# Patient Record
Sex: Female | Born: 1960 | Race: Black or African American | Hispanic: No | Marital: Married | Smoking: Never smoker
Health system: Southern US, Community
[De-identification: ages and names within clinical notes are randomized; demographics above are authoritative.]

## PROBLEM LIST (undated history)

## (undated) DIAGNOSIS — K219 Gastro-esophageal reflux disease without esophagitis: Secondary | ICD-10-CM

## (undated) DIAGNOSIS — M1990 Unspecified osteoarthritis, unspecified site: Secondary | ICD-10-CM

## (undated) DIAGNOSIS — D649 Anemia, unspecified: Secondary | ICD-10-CM

## (undated) DIAGNOSIS — E079 Disorder of thyroid, unspecified: Secondary | ICD-10-CM

## (undated) DIAGNOSIS — K760 Fatty (change of) liver, not elsewhere classified: Secondary | ICD-10-CM

## (undated) DIAGNOSIS — I10 Essential (primary) hypertension: Secondary | ICD-10-CM

## (undated) HISTORY — DX: Essential (primary) hypertension: I10

## (undated) HISTORY — DX: Disorder of thyroid, unspecified: E07.9

## (undated) HISTORY — PX: APPENDECTOMY: SHX54

## (undated) HISTORY — DX: Anemia, unspecified: D64.9

## (undated) HISTORY — PX: CARPAL TUNNEL RELEASE: SHX101

## (undated) HISTORY — DX: Unspecified osteoarthritis, unspecified site: M19.90

---

## 1999-09-04 ENCOUNTER — Other Ambulatory Visit: Admission: RE | Admit: 1999-09-04 | Discharge: 1999-09-04 | Payer: Self-pay | Admitting: Obstetrics and Gynecology

## 1999-12-02 ENCOUNTER — Encounter (INDEPENDENT_AMBULATORY_CARE_PROVIDER_SITE_OTHER): Payer: Self-pay

## 1999-12-02 ENCOUNTER — Inpatient Hospital Stay (HOSPITAL_COMMUNITY): Admission: AD | Admit: 1999-12-02 | Discharge: 1999-12-04 | Payer: Self-pay | Admitting: *Deleted

## 2001-06-22 ENCOUNTER — Other Ambulatory Visit: Admission: RE | Admit: 2001-06-22 | Discharge: 2001-06-22 | Payer: Self-pay | Admitting: General Surgery

## 2001-12-16 ENCOUNTER — Other Ambulatory Visit: Admission: RE | Admit: 2001-12-16 | Discharge: 2001-12-16 | Payer: Self-pay | Admitting: Obstetrics and Gynecology

## 2002-03-03 ENCOUNTER — Encounter: Payer: Self-pay | Admitting: Obstetrics and Gynecology

## 2002-03-03 ENCOUNTER — Ambulatory Visit (HOSPITAL_COMMUNITY): Admission: RE | Admit: 2002-03-03 | Discharge: 2002-03-03 | Payer: Self-pay | Admitting: Obstetrics and Gynecology

## 2002-04-29 ENCOUNTER — Encounter: Admission: RE | Admit: 2002-04-29 | Discharge: 2002-04-29 | Payer: Self-pay | Admitting: Obstetrics and Gynecology

## 2002-05-11 ENCOUNTER — Encounter: Payer: Self-pay | Admitting: Obstetrics and Gynecology

## 2002-05-11 ENCOUNTER — Ambulatory Visit (HOSPITAL_COMMUNITY): Admission: RE | Admit: 2002-05-11 | Discharge: 2002-05-11 | Payer: Self-pay | Admitting: Obstetrics and Gynecology

## 2002-06-10 ENCOUNTER — Encounter: Payer: Self-pay | Admitting: Obstetrics and Gynecology

## 2002-06-10 ENCOUNTER — Ambulatory Visit (HOSPITAL_COMMUNITY): Admission: RE | Admit: 2002-06-10 | Discharge: 2002-06-10 | Payer: Self-pay | Admitting: Obstetrics and Gynecology

## 2002-06-16 ENCOUNTER — Encounter: Payer: Self-pay | Admitting: Obstetrics and Gynecology

## 2002-06-16 ENCOUNTER — Ambulatory Visit (HOSPITAL_COMMUNITY): Admission: RE | Admit: 2002-06-16 | Discharge: 2002-06-16 | Payer: Self-pay | Admitting: Obstetrics and Gynecology

## 2002-07-07 ENCOUNTER — Ambulatory Visit (HOSPITAL_COMMUNITY): Admission: RE | Admit: 2002-07-07 | Discharge: 2002-07-07 | Payer: Self-pay | Admitting: Obstetrics and Gynecology

## 2002-07-07 ENCOUNTER — Encounter: Payer: Self-pay | Admitting: Obstetrics and Gynecology

## 2002-07-11 ENCOUNTER — Inpatient Hospital Stay (HOSPITAL_COMMUNITY): Admission: AD | Admit: 2002-07-11 | Discharge: 2002-07-13 | Payer: Self-pay | Admitting: Obstetrics and Gynecology

## 2002-09-29 ENCOUNTER — Ambulatory Visit (HOSPITAL_BASED_OUTPATIENT_CLINIC_OR_DEPARTMENT_OTHER): Admission: RE | Admit: 2002-09-29 | Discharge: 2002-09-29 | Payer: Self-pay | Admitting: Orthopedic Surgery

## 2003-03-01 ENCOUNTER — Other Ambulatory Visit: Admission: RE | Admit: 2003-03-01 | Discharge: 2003-03-01 | Payer: Self-pay | Admitting: Obstetrics and Gynecology

## 2003-07-22 ENCOUNTER — Emergency Department (HOSPITAL_COMMUNITY): Admission: EM | Admit: 2003-07-22 | Discharge: 2003-07-22 | Payer: Self-pay | Admitting: Emergency Medicine

## 2003-07-22 ENCOUNTER — Encounter: Payer: Self-pay | Admitting: Emergency Medicine

## 2003-07-23 ENCOUNTER — Emergency Department (HOSPITAL_COMMUNITY): Admission: EM | Admit: 2003-07-23 | Discharge: 2003-07-23 | Payer: Self-pay | Admitting: Emergency Medicine

## 2004-11-15 ENCOUNTER — Ambulatory Visit: Payer: Self-pay | Admitting: Family Medicine

## 2005-02-14 ENCOUNTER — Ambulatory Visit: Payer: Self-pay | Admitting: Family Medicine

## 2006-04-24 ENCOUNTER — Ambulatory Visit (HOSPITAL_COMMUNITY): Admission: RE | Admit: 2006-04-24 | Discharge: 2006-04-24 | Payer: Self-pay | Admitting: Obstetrics and Gynecology

## 2006-05-28 ENCOUNTER — Other Ambulatory Visit: Admission: RE | Admit: 2006-05-28 | Discharge: 2006-05-28 | Payer: Self-pay | Admitting: Obstetrics and Gynecology

## 2006-08-28 ENCOUNTER — Emergency Department (HOSPITAL_COMMUNITY): Admission: EM | Admit: 2006-08-28 | Discharge: 2006-08-28 | Payer: Self-pay | Admitting: Emergency Medicine

## 2006-12-28 ENCOUNTER — Ambulatory Visit: Payer: Self-pay | Admitting: Family Medicine

## 2007-01-19 ENCOUNTER — Ambulatory Visit: Payer: Self-pay | Admitting: Family Medicine

## 2007-06-16 ENCOUNTER — Ambulatory Visit: Payer: Self-pay | Admitting: Family Medicine

## 2007-06-16 LAB — CONVERTED CEMR LAB
ALT: 9 units/L (ref 0–35)
Alkaline Phosphatase: 71 units/L (ref 39–117)
CO2: 24 meq/L (ref 19–32)
LDL Cholesterol: 117 mg/dL — ABNORMAL HIGH (ref 0–99)
Sodium: 142 meq/L (ref 135–145)
Total Bilirubin: 0.4 mg/dL (ref 0.3–1.2)
Total Protein: 7.2 g/dL (ref 6.0–8.3)
VLDL: 17 mg/dL (ref 0–40)

## 2007-08-04 ENCOUNTER — Telehealth (INDEPENDENT_AMBULATORY_CARE_PROVIDER_SITE_OTHER): Payer: Self-pay | Admitting: *Deleted

## 2007-08-05 ENCOUNTER — Ambulatory Visit: Payer: Self-pay | Admitting: Nurse Practitioner

## 2007-08-05 DIAGNOSIS — R5383 Other fatigue: Secondary | ICD-10-CM

## 2007-08-05 DIAGNOSIS — K219 Gastro-esophageal reflux disease without esophagitis: Secondary | ICD-10-CM

## 2007-08-05 DIAGNOSIS — R5381 Other malaise: Secondary | ICD-10-CM

## 2007-08-05 DIAGNOSIS — Z9089 Acquired absence of other organs: Secondary | ICD-10-CM | POA: Insufficient documentation

## 2007-08-05 DIAGNOSIS — I10 Essential (primary) hypertension: Secondary | ICD-10-CM | POA: Insufficient documentation

## 2007-08-05 DIAGNOSIS — Z8669 Personal history of other diseases of the nervous system and sense organs: Secondary | ICD-10-CM | POA: Insufficient documentation

## 2007-08-05 LAB — CONVERTED CEMR LAB
Hemoglobin: 13.8 g/dL (ref 12.0–15.0)
Lymphocytes Relative: 39 % (ref 12–46)
Monocytes Absolute: 0.3 10*3/uL (ref 0.2–0.7)
Monocytes Relative: 5 % (ref 3–11)
Neutro Abs: 3.2 10*3/uL (ref 1.7–7.7)
RBC: 5.12 M/uL — ABNORMAL HIGH (ref 3.87–5.11)

## 2007-08-09 DIAGNOSIS — M79609 Pain in unspecified limb: Secondary | ICD-10-CM

## 2007-08-10 ENCOUNTER — Ambulatory Visit: Payer: Self-pay | Admitting: Family Medicine

## 2007-09-06 ENCOUNTER — Telehealth (INDEPENDENT_AMBULATORY_CARE_PROVIDER_SITE_OTHER): Payer: Self-pay | Admitting: Internal Medicine

## 2007-09-08 ENCOUNTER — Ambulatory Visit: Payer: Self-pay | Admitting: Family Medicine

## 2007-09-09 ENCOUNTER — Telehealth (INDEPENDENT_AMBULATORY_CARE_PROVIDER_SITE_OTHER): Payer: Self-pay | Admitting: *Deleted

## 2007-09-09 ENCOUNTER — Ambulatory Visit (HOSPITAL_COMMUNITY): Admission: RE | Admit: 2007-09-09 | Discharge: 2007-09-09 | Payer: Self-pay | Admitting: Family Medicine

## 2007-09-14 ENCOUNTER — Ambulatory Visit: Payer: Self-pay | Admitting: Family Medicine

## 2007-09-20 ENCOUNTER — Encounter (INDEPENDENT_AMBULATORY_CARE_PROVIDER_SITE_OTHER): Payer: Self-pay | Admitting: Family Medicine

## 2007-09-21 ENCOUNTER — Encounter (INDEPENDENT_AMBULATORY_CARE_PROVIDER_SITE_OTHER): Payer: Self-pay | Admitting: Family Medicine

## 2007-09-22 ENCOUNTER — Encounter (INDEPENDENT_AMBULATORY_CARE_PROVIDER_SITE_OTHER): Payer: Self-pay | Admitting: Family Medicine

## 2008-06-29 ENCOUNTER — Ambulatory Visit: Payer: Self-pay | Admitting: Nurse Practitioner

## 2008-06-29 DIAGNOSIS — E049 Nontoxic goiter, unspecified: Secondary | ICD-10-CM | POA: Insufficient documentation

## 2008-06-29 DIAGNOSIS — B370 Candidal stomatitis: Secondary | ICD-10-CM | POA: Insufficient documentation

## 2008-06-30 LAB — CONVERTED CEMR LAB
ALT: 11 units/L (ref 0–35)
BUN: 13 mg/dL (ref 6–23)
Basophils Absolute: 0 10*3/uL (ref 0.0–0.1)
Basophils Relative: 0 % (ref 0–1)
CO2: 30 meq/L (ref 19–32)
Creatinine, Ser: 0.7 mg/dL (ref 0.40–1.20)
Eosinophils Relative: 6 % — ABNORMAL HIGH (ref 0–5)
HCT: 40.7 % (ref 36.0–46.0)
Hemoglobin: 13.4 g/dL (ref 12.0–15.0)
Lymphocytes Relative: 41 % (ref 12–46)
MCHC: 32.9 g/dL (ref 30.0–36.0)
Monocytes Absolute: 0.5 10*3/uL (ref 0.1–1.0)
RDW: 13.5 % (ref 11.5–15.5)
TSH: 1.544 microintl units/mL (ref 0.350–4.50)
Total Bilirubin: 0.3 mg/dL (ref 0.3–1.2)

## 2008-07-03 ENCOUNTER — Ambulatory Visit: Payer: Self-pay | Admitting: *Deleted

## 2008-07-05 ENCOUNTER — Ambulatory Visit (HOSPITAL_COMMUNITY): Admission: RE | Admit: 2008-07-05 | Discharge: 2008-07-05 | Payer: Self-pay | Admitting: Family Medicine

## 2008-07-06 ENCOUNTER — Telehealth (INDEPENDENT_AMBULATORY_CARE_PROVIDER_SITE_OTHER): Payer: Self-pay | Admitting: Family Medicine

## 2008-07-31 ENCOUNTER — Telehealth (INDEPENDENT_AMBULATORY_CARE_PROVIDER_SITE_OTHER): Payer: Self-pay | Admitting: Family Medicine

## 2008-08-15 ENCOUNTER — Encounter (INDEPENDENT_AMBULATORY_CARE_PROVIDER_SITE_OTHER): Payer: Self-pay | Admitting: *Deleted

## 2008-12-03 ENCOUNTER — Emergency Department (HOSPITAL_COMMUNITY): Admission: EM | Admit: 2008-12-03 | Discharge: 2008-12-03 | Payer: Self-pay | Admitting: Family Medicine

## 2008-12-04 ENCOUNTER — Telehealth (INDEPENDENT_AMBULATORY_CARE_PROVIDER_SITE_OTHER): Payer: Self-pay | Admitting: *Deleted

## 2009-01-01 ENCOUNTER — Ambulatory Visit: Payer: Self-pay | Admitting: Family Medicine

## 2009-01-01 DIAGNOSIS — Z8639 Personal history of other endocrine, nutritional and metabolic disease: Secondary | ICD-10-CM

## 2009-01-01 DIAGNOSIS — Z862 Personal history of diseases of the blood and blood-forming organs and certain disorders involving the immune mechanism: Secondary | ICD-10-CM

## 2009-01-01 DIAGNOSIS — Z87448 Personal history of other diseases of urinary system: Secondary | ICD-10-CM | POA: Insufficient documentation

## 2009-01-01 LAB — CONVERTED CEMR LAB
Bilirubin Urine: NEGATIVE
Blood in Urine, dipstick: NEGATIVE
CO2: 24 meq/L (ref 19–32)
Chloride: 102 meq/L (ref 96–112)
Glucose, Bld: 76 mg/dL (ref 70–99)
Ketones, urine, test strip: NEGATIVE
Nitrite: NEGATIVE
Potassium: 3.8 meq/L (ref 3.5–5.3)
Sodium: 144 meq/L (ref 135–145)

## 2009-01-02 ENCOUNTER — Encounter (INDEPENDENT_AMBULATORY_CARE_PROVIDER_SITE_OTHER): Payer: Self-pay | Admitting: Family Medicine

## 2009-10-19 ENCOUNTER — Telehealth: Payer: Self-pay | Admitting: Physician Assistant

## 2009-10-31 ENCOUNTER — Ambulatory Visit: Payer: Self-pay | Admitting: Physician Assistant

## 2009-10-31 DIAGNOSIS — R079 Chest pain, unspecified: Secondary | ICD-10-CM

## 2009-11-01 ENCOUNTER — Encounter: Payer: Self-pay | Admitting: Physician Assistant

## 2009-11-17 HISTORY — PX: EYE SURGERY: SHX253

## 2010-06-20 ENCOUNTER — Telehealth: Payer: Self-pay | Admitting: Physician Assistant

## 2010-08-30 ENCOUNTER — Ambulatory Visit: Payer: Self-pay | Admitting: Physician Assistant

## 2010-08-30 DIAGNOSIS — M542 Cervicalgia: Secondary | ICD-10-CM

## 2010-09-02 ENCOUNTER — Ambulatory Visit (HOSPITAL_COMMUNITY): Admission: RE | Admit: 2010-09-02 | Discharge: 2010-09-02 | Payer: Self-pay | Admitting: Internal Medicine

## 2010-09-02 LAB — CONVERTED CEMR LAB
BUN: 13 mg/dL (ref 6–23)
CRP: 0.6 mg/dL — ABNORMAL HIGH (ref <0.6)
Glucose, Bld: 87 mg/dL (ref 70–99)
Potassium: 3.7 meq/L (ref 3.5–5.3)

## 2010-09-03 ENCOUNTER — Encounter: Payer: Self-pay | Admitting: Physician Assistant

## 2010-09-03 ENCOUNTER — Encounter (INDEPENDENT_AMBULATORY_CARE_PROVIDER_SITE_OTHER): Payer: Self-pay | Admitting: *Deleted

## 2010-09-04 ENCOUNTER — Encounter (INDEPENDENT_AMBULATORY_CARE_PROVIDER_SITE_OTHER): Payer: Self-pay | Admitting: *Deleted

## 2010-09-16 ENCOUNTER — Ambulatory Visit: Payer: Self-pay | Admitting: Physician Assistant

## 2010-09-16 ENCOUNTER — Encounter (INDEPENDENT_AMBULATORY_CARE_PROVIDER_SITE_OTHER): Payer: Self-pay | Admitting: Nurse Practitioner

## 2010-09-16 LAB — CONVERTED CEMR LAB
BUN: 12 mg/dL (ref 6–23)
Calcium: 9.3 mg/dL (ref 8.4–10.5)
Creatinine, Ser: 0.64 mg/dL (ref 0.40–1.20)
Glucose, Bld: 80 mg/dL (ref 70–99)

## 2010-09-17 ENCOUNTER — Encounter (INDEPENDENT_AMBULATORY_CARE_PROVIDER_SITE_OTHER): Payer: Self-pay | Admitting: Nurse Practitioner

## 2010-10-30 ENCOUNTER — Ambulatory Visit: Payer: Self-pay | Admitting: Nurse Practitioner

## 2010-11-06 ENCOUNTER — Telehealth (INDEPENDENT_AMBULATORY_CARE_PROVIDER_SITE_OTHER): Payer: Self-pay | Admitting: *Deleted

## 2010-12-08 ENCOUNTER — Encounter: Payer: Self-pay | Admitting: Internal Medicine

## 2010-12-15 LAB — CONVERTED CEMR LAB
ALT: 17 units/L (ref 0–35)
AST: 14 units/L (ref 0–37)
Basophils Absolute: 0 10*3/uL (ref 0.0–0.1)
Basophils Relative: 0 % (ref 0–1)
Calcium: 9.7 mg/dL (ref 8.4–10.5)
Chloride: 96 meq/L (ref 96–112)
Creatinine, Ser: 0.64 mg/dL (ref 0.40–1.20)
Eosinophils Relative: 3 % (ref 0–5)
HCT: 42.8 % (ref 36.0–46.0)
Hemoglobin: 13.9 g/dL (ref 12.0–15.0)
MCHC: 32.5 g/dL (ref 30.0–36.0)
MCV: 83.4 fL (ref 78.0–100.0)
Monocytes Absolute: 0.5 10*3/uL (ref 0.1–1.0)
Monocytes Relative: 7 % (ref 3–12)
RBC: 5.13 M/uL — ABNORMAL HIGH (ref 3.87–5.11)
RDW: 13.5 % (ref 11.5–15.5)
Rhuematoid fact SerPl-aCnc: <20 intl units/mL (ref 0–20)

## 2010-12-17 NOTE — Letter (Signed)
Summary: *HSN Results Follow up  [facility]  [address] [place], [place] [address]   Phone: [phone]  Fax: [phone]      09/04/2010   Pam Hart [address] [place], [place]  [address]   Dear  Ms. [name],                            ____S.Drinkard,FNP   ____D. Gore,FNP       ____B. McPherson,MD   ____V. Rankins,MD    ____E. Mulberry,MD    ____N. Daphine Deutscher, FNP  ____D. Reche Dixon, MD    ____K. Philipp Deputy, MD    ____Other     This letter is to inform you that your recent test(s):  _______Pap Smear    __X_____Lab Test     _______X-ray    ___X____ is within acceptable limits  _______ requires a medication change  _______ requires a follow-up lab visit  _______ requires a follow-up visit with your provider   Comments:  Your labs are negative for rhematoid and lupus.       _________________________________________________________ If you have any questions, please contact our office                     Sincerely,  [name] [facility]

## 2010-12-17 NOTE — Progress Notes (Signed)
Summary: Acute issue//gk  Phone Note Call from Patient Call back at [phone]   Summary of Call: The pt wants to be seen because she has throat itching; pt doesn't know if is infected.  This pt had been like that for five days (comes and go) Alben Spittle PA-c Initial call taken by: Manon Hilding,  June 20, 2010 4:38 PM  Follow-up for Phone Call        States her throat is itching, feels like something is stuck.  Denies fever and cough.  States it is not a sore throat, it happens all the time.  When she sees a doctor, they give her antibiotics, but they are not effective.  Wants to find out why this happens.  No appt. available until 07/17/10 which she refused -- wants to be seen very soon. Follow-up by: Dutch Quint RN,  June 20, 2010 5:44 PM  Additional Follow-up for Phone Call Additional follow up Details #1::        She can be put on Triage RN schedule or put in for acute visit with any provider who can see her. Tereso Newcomer PA-C  June 21, 2010 1:22 PM  She is going to go to urgent care and follow-up with provider on 07/17/10.  Dutch Quint RN  June 21, 2010 3:53 PM

## 2010-12-17 NOTE — Assessment & Plan Note (Signed)
Summary: HTN; Neck and Hand pain   Vital Signs:  Patient profile:   50 year old female Weight:      141.1 pounds Temp:     97.8 degrees F oral Pulse rate:   80 / minute Pulse rhythm:   regular Resp:     20 per minute BP sitting:   110 / 80  (left arm) Cuff size:   regular  Vitals Entered By: Hassell Halim (August 30, 2010 12:44 PM) CC: Needs Rx for BP, has left sided pain in arm, Hypertension Management   Primary Care Provider:  Rankins  CC:  Needs Rx for BP, has left sided pain in arm, and Hypertension Management.  History of Present Illness: Here for BP. Placed her on Maxzide in Dec 2010.  Never came back for bmet or bp check.  Ran out recently.  Has been taking HCTZ.  Feels some leg cramps.  Used to be on K+ with HCTZ.    Neck Pain/Hand pain:  Has noted for years.  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  Feels pain in arms and hands.  No specific dermatome.  Some numbness in 4th and 5th fingertips.  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  Worse in am in her hands.  Sister has RA.  Worried she may have as well.  Notes some weakness in hands.  No rashes.  GERD:  Takes nexium as needed.  No daily symptoms.  Hypertension History:      She denies headache and dyspnea with exertion.        Positive major cardiovascular risk factors include hypertension.  Negative major cardiovascular risk factors include female age less than 51 years old, negative family history for ischemic heart disease, and non-tobacco-user status.     Allergies: No Known Drug Allergies  Review of Systems MS:  Complains of cramps.  Physical Exam  General:  alert, well-developed, and well-nourished.   Head:  normocephalic and atraumatic.   Eyes:  pupils equal, pupils round, pupils reactive to light, and no optic disk abnormalities.   Neck:  supple.   Lungs:  normal breath sounds.   Heart:  normal rate and regular rhythm.   Msk:  normal ROM, no joint tenderness, no joint swelling, and no joint warmth.  trap muscles bilat  tight and mildly tender to palp Neurologic:  alert & oriented X3 and cranial nerves II-XII intact.   Skin:  no rashes.   Psych:  normally interactive.     Impression & Recommendations:  Problem # 1:  HYPERTENSION, ESSENTIAL NOS (ICD-401.9)  changed to maxzide last Dec. pt was supposed to come back for bp check and bmet in couple weeks this is her first visit back now taking HCTZ again she states she will come back for labs this time will only fill x 2 mos to make sure  Her updated medication list for this problem includes:    Maxzide-25 37.5-25 Mg Tabs (Triamterene-hctz) .Marland Kitchen... Take 1 tablet by mouth once a day  Orders: T-Basic Metabolic Panel [phone])  Problem # 2:  HAND PAIN, BILATERAL (ICD-729.5)  may be CTS again but she is describing more ular n distribution pain advised her to use carpal tunnel brace at bedtime  nsaids as needed has FHx of RA . Marland Kitchen . will check ESR  Orders: T-Sed Rate (Automated) ([id number]) T-C-Reactive Protein [phone])  Problem # 3:  NECK PAIN (ICD-723.1)  check ESR as above get neck xray nsaids and muscle relaxer f/u in 1-2 mos not certain she  is describing radicular symptoms may need to consider PT consider MRI if symptoms more sugg of radiculopathy  Orders: Diagnostic X-Ray/Fluoroscopy (Diagnostic X-Ray/Flu)  Her updated medication list for this problem includes:    Naprosyn 500 Mg Tabs (Naproxen) .Marland Kitchen... Take 1 tablet by mouth two times a day with food as needed for pain    Robaxin 500 Mg Tabs (Methocarbamol) .Marland Kitchen... 1-2 tabs every 8 hours as needed for spasm or pain  Problem # 4:  GERD (ICD-530.81) only taking nexium as needed advised against take pepcid as needed  The following medications were removed from the medication list:    Nexium 40 Mg Cpdr (Esomeprazole magnesium) .Marland Kitchen... Take 1 tablet by mouth once a day Her updated medication list for this problem includes:    Pepcid 20 Mg Tabs (Famotidine) .Marland Kitchen... Take 1 tablet by  mouth two times a day as needed for indigestion  Complete Medication List: 1)  Maxzide-25 37.5-25 Mg Tabs (Triamterene-hctz) .... Take 1 tablet by mouth once a day 2)  Pepcid 20 Mg Tabs (Famotidine) .... Take 1 tablet by mouth two times a day as needed for indigestion 3)  Naprosyn 500 Mg Tabs (Naproxen) .... Take 1 tablet by mouth two times a day with food as needed for pain 4)  Robaxin 500 Mg Tabs (Methocarbamol) .Marland Kitchen.. 1-2 tabs every 8 hours as needed for spasm or pain  Hypertension Assessment/Plan:      The patient's hypertensive risk group is category A: No risk factors and no target organ damage.  Her calculated 10 year risk of coronary heart disease is 5 %.  Today's blood pressure is 110/80.  Her blood pressure goal is < 140/90.  Patient Instructions: 1)  Stop the HCTZ. 2)  Start back on Maxzide. 3)  Return in 2 weeks for BP check and BMET (dx 401.1).  Notify provider if BP > 140/90 or < 110/60. 4)  Take naprosyn 500 mg two times a day with food every day for 3 days.  Then, take as needed. 5)  Take robaxin as needed for muslce pain or spasm. 6)  Wear a carpal tunnel brace at bedtime every night.  We have a right arm brace.  You'll have to get a left one from the pharmacy ([facility], [facility], etc). 7)  Get the xray of your neck. 8)  Schedule a follow up in 2 months for your neck and hands.  Return sooner as needed. Prescriptions: ROBAXIN 500 MG TABS (METHOCARBAMOL) 1-2 tabs every 8 hours as needed for spasm or pain  #30 x 1   Entered and Authorized by:   Tereso Newcomer PA-C   Signed by:   Tereso Newcomer PA-C on 08/30/2010   Method used:   Print then Give to Patient   RxID:   [id number] NAPROSYN 500 MG TABS (NAPROXEN) Take 1 tablet by mouth two times a day with food as needed for pain  #30 x 2   Entered and Authorized by:   Tereso Newcomer PA-C   Signed by:   Tereso Newcomer PA-C on 08/30/2010   Method used:   Print then Give to Patient   RxID:   [id number] PEPCID 20 MG TABS (FAMOTIDINE)  Take 1 tablet by mouth two times a day as needed for indigestion  #60 x 2   Entered and Authorized by:   Tereso Newcomer PA-C   Signed by:   Tereso Newcomer PA-C on 08/30/2010   Method used:   Print then Give to Patient   RxID:  [id number] MAXZIDE-25 37.5-25 MG TABS (TRIAMTERENE-HCTZ) Take 1 tablet by mouth once a day  #30 x 1   Entered and Authorized by:   Tereso Newcomer PA-C   Signed by:   Tereso Newcomer PA-C on 08/30/2010   Method used:   Print then Give to Patient   RxID:   [id number]   Appended Document: HTN; Neck and Hand pain   Influenza Vaccine    Vaccine Type: Fluvax 3+    Site: left deltoid    Mfr: GlaxoSmithKline    Dose: 0.5 ml    Route: IM    Given by: [name]    Exp. Date: 04/2011    Lot #: [id number]    VIS given: 06/11/10 version given August 30, 2010.  Flu Vaccine Consent Questions    Do you have a history of severe allergic reactions to this vaccine? no    Any prior history of allergic reactions to egg and/or gelatin? no    Do you have a sensitivity to the preservative Thimersol? no    Do you have a past history of Guillan-Barre Syndrome? no    Do you currently have an acute febrile illness? no    Have you ever had a severe reaction to latex? no    Vaccine information given and explained to patient? yes    Are you currently pregnant? no

## 2010-12-17 NOTE — Letter (Signed)
Summary: *HSN Results Follow up  [facility]  [address] [place], [place] [address]   Phone: [phone]  Fax: [phone]      09/03/2010   APREL EGELHOFF [address] [place], [place]  [address]   Dear  Ms. [name],                            ____S.Drinkard,FNP   ____D. Gore,FNP       ____B. McPherson,MD   ____V. Rankins,MD    ____E. Mulberry,MD    ____N. Daphine Deutscher, FNP  ____D. Reche Dixon, MD    ____K. Philipp Deputy, MD    ____Other     This letter is to inform you that your recent test(s):  _______Pap Smear    _______Lab Test     ___X____X-ray    ___X____ is within acceptable limits  _______ requires a medication change  _______ requires a follow-up lab visit  _______ requires a follow-up visit with your provider   Comments:       _________________________________________________________ If you have any questions, please contact our office                     Sincerely,  [name] [facility]

## 2010-12-17 NOTE — Assessment & Plan Note (Signed)
Summary: BP CHACK AND BMET(401.1).Marland KitchenMarland KitchenCM  Nurse Visit   Vital Signs:  Patient profile:   50 year old female BP sitting:   140 / 78  (left arm) Cuff size:   regular  Vitals Entered By: Chauncy Passy, CMA  Patient Instructions: 1)  Pt. needs to f/u in 1 week. Make sure to take your medications   Allergies: No Known Drug Allergies  Orders Added: 1)  Est. Patient Level I [[address]] 2)  T-Basic Metabolic Panel [[id number]]

## 2010-12-17 NOTE — Letter (Signed)
Summary: *HSN Results Follow up  [facility]  [address] [place], [place] [address]   Phone: [phone]  Fax: [phone]      09/17/2010   Pam Hart [address] [place], [place]  [address]   Dear  Ms. [name],                            ____S.Drinkard,FNP   ____D. Gore,FNP       ____B. McPherson,MD   ____V. Rankins,MD    ____E. Mulberry,MD    _X___N. Daphine Deutscher, FNP  ____D. Reche Dixon, MD    ____K. Philipp Deputy, MD    ____Other     This letter is to inform you that your recent test(s):  _______Pap Smear    ___X____Lab Test     _______X-ray    ____X___ is within acceptable limits  _______ requires a medication change  _______ requires a follow-up lab visit  _______ requires a follow-up visit with your provider   Comments:  Labs done during recent office visit are normal.       _________________________________________________________ If you have any questions, please contact our office [phone].                    Sincerely,    Lehman Prom FNP [facility]

## 2010-12-19 NOTE — Progress Notes (Signed)
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 BUT SHE WANTS TO KNOW IF SHE CAN, I TOLD HER THAT IW ILL FIND OUT AND WE WOULD LET HER KNOW. Initial call taken by: Leodis Rains,  November 06, 2010 3:07 PM  Follow-up for Phone Call        Since there has been no actual contact for this patient. Please allow them to schedule and make sure they understand our no show policy and their history with no shows. Let patient know any further no-shows they will be discharged from practice. Thanks   Follow-up by: Hassell Halim CMA,  November 14, 2010 9:36 AM  Additional Follow-up for Phone Call Additional follow up Details #1::        CALLED AND LEFT MESSAGE FOR PATIENT TO CALL BACK AND SCHEDULE AN APPT. Additional Follow-up by: Leodis Rains,  November 14, 2010 10:18 AM    Additional Follow-up for Phone Call Additional follow up Details #2::    called and left message to let patient know that she can make another appt to be seen again. Follow-up by: Leodis Rains,  November 28, 2010 3:45 PM

## 2011-02-17 ENCOUNTER — Inpatient Hospital Stay (INDEPENDENT_AMBULATORY_CARE_PROVIDER_SITE_OTHER)
Admission: RE | Admit: 2011-02-17 | Discharge: 2011-02-17 | Disposition: A | Discharge status: Home / Self Care | Payer: Self-pay | Source: Ambulatory Visit | Attending: Family Medicine | Admitting: Family Medicine

## 2011-02-17 DIAGNOSIS — J029 Acute pharyngitis, unspecified: Secondary | ICD-10-CM

## 2011-02-25 ENCOUNTER — Other Ambulatory Visit: Payer: Self-pay | Admitting: Otolaryngology

## 2011-02-25 DIAGNOSIS — R1110 Vomiting, unspecified: Secondary | ICD-10-CM

## 2011-03-03 LAB — POCT URINALYSIS DIP (DEVICE)
Bilirubin Urine: NEGATIVE
Glucose, UA: NEGATIVE mg/dL
Nitrite: POSITIVE — AB
Urobilinogen, UA: 0.2 mg/dL (ref 0.0–1.0)

## 2011-03-03 LAB — POCT I-STAT, CHEM 8
Chloride: 100 mEq/L (ref 96–112)
Glucose, Bld: 97 mg/dL (ref 70–99)
HCT: 41 % (ref 36.0–46.0)
Hemoglobin: 13.9 g/dL (ref 12.0–15.0)
Potassium: 3.3 mEq/L — ABNORMAL LOW (ref 3.5–5.1)
Sodium: 140 mEq/L (ref 135–145)

## 2011-03-04 ENCOUNTER — Other Ambulatory Visit: Payer: Self-pay

## 2011-03-04 ENCOUNTER — Ambulatory Visit
Admission: RE | Admit: 2011-03-04 | Discharge: 2011-03-04 | Disposition: A | Discharge status: Home / Self Care | Payer: Self-pay | Source: Ambulatory Visit | Attending: Otolaryngology | Admitting: Otolaryngology

## 2011-03-04 DIAGNOSIS — R1110 Vomiting, unspecified: Secondary | ICD-10-CM

## 2011-03-12 ENCOUNTER — Other Ambulatory Visit (HOSPITAL_COMMUNITY): Payer: Self-pay | Admitting: Family Medicine

## 2011-03-12 DIAGNOSIS — Z1231 Encounter for screening mammogram for malignant neoplasm of breast: Secondary | ICD-10-CM

## 2011-03-20 ENCOUNTER — Ambulatory Visit (HOSPITAL_COMMUNITY)
Admission: RE | Admit: 2011-03-20 | Discharge: 2011-03-20 | Disposition: A | Discharge status: Home / Self Care | Payer: Self-pay | Source: Ambulatory Visit | Attending: Family Medicine | Admitting: Family Medicine

## 2011-03-20 DIAGNOSIS — Z1231 Encounter for screening mammogram for malignant neoplasm of breast: Secondary | ICD-10-CM | POA: Insufficient documentation

## 2011-03-21 ENCOUNTER — Ambulatory Visit (HOSPITAL_COMMUNITY): Payer: Self-pay

## 2011-04-01 ENCOUNTER — Other Ambulatory Visit: Payer: Self-pay | Admitting: Family Medicine

## 2011-04-01 DIAGNOSIS — R928 Other abnormal and inconclusive findings on diagnostic imaging of breast: Secondary | ICD-10-CM

## 2011-04-04 NOTE — H&P (Signed)
NAME:  Pam Hart, Pam Hart                         ACCOUNT NO.:  [id number]   MEDICAL RECORD NO.:  [id number]                   PATIENT TYPE:  INP   LOCATION:  9136                                 FACILITY:  [facility]   PHYSICIAN:  Crist Fat. Rivard, M.D.              DATE OF BIRTH:  1961/04/06   DATE OF ADMISSION:  07/11/2002  DATE OF DISCHARGE:                                HISTORY & PHYSICAL   HISTORY OF PRESENT ILLNESS:  The patient is a 50 year old, married, [place]  female, gravida 5, para 3-1-0-3, who presents at 39-2/7 week, complaining of  uterine contractions all morning.  She presented to the [facility] office and was found to be 7 cm and 80% at that time.  She was  quickly transferred to [facility] and delivery was anticipated.  She  denies any leaking or vaginal bleeding.  She denied any nausea, vomiting,  headaches, or visual disturbances.  She is requesting epidural for pain upon  admission.  Her pregnancy has been followed at [facility] by  the M.D. service and has been at risk for history of stillbirth at 25 weeks  with severe preeclampsia, a history of female circumcision, and advanced  maternal age.  She also is group B Streptococcus  positive.   OBSTETRICAL AND GYNECOLOGICAL HISTORY:  She is a gravida 5, para 3-1-0-3,  who delivered a nonviable 25-week pregnancy in June of 1994.  The infant was  stillborn and she had severe preeclampsia.  In August of 1995, she delivered  a viable female infant who weighed 7 pounds 8 ounces at [redacted] weeks gestation  following a 10-12-hour labor in the [place] with no complications.  In  November of 1997, she delivered a viable female infant who weighed 6 pounds 7  ounces at [redacted] weeks gestation following a five to six-hour labor, also with  no complications.  In January of 2001, she delivered a viable female infant  that weighed 7 pounds at [redacted] weeks gestation following a 14-hour labor.  That  infant was delivered at  [facility] with no complications also.  Other  GYN history is significant for a history of female circumcision.  She has  had a clitorectomy, but no infibulation.   ALLERGIES:  She has no known drug allergies.   PAST MEDICAL HISTORY:  She reports having had the usual childhood diseases.  She reports no other major problems other than an appendectomy in the 1970s  and a history of female circumcision.   FAMILY HISTORY:  Significant for mother and maternal aunt with MI and mother  with diabetes on medication.   GENETIC HISTORY:  Significant only for the fact that the patient is over age  88.   PREGNANCY HISTORY:  She does have gestational diabetes requiring insulin.   SOCIAL HISTORY:  She is married to [employer].  They are of the  Islamic faith.  They deny any illicit drug use, alcohol, or smoking with  this pregnancy.   PRENATAL LABORATORY DATA:  Her blood type is B+.  Her antibody screen was  negative.  Syphilis was nonreactive.  Rubella is immune.  Hepatitis B  surface antigen is negative.  HIV is nonreactive.  GC and Chlamydia are both  negative.  Pap is within normal limits.  Her three-hour GTT was all elevated  and she was started on insulin because of fasting hyperglycemia.  The infant  has had adequate growth throughout her pregnancy.   PHYSICAL EXAMINATION:  VITAL SIGNS:  Stable.  She is afebrile.  HEENT:  Grossly within normal limits.  HEART:  Regular rate and rhythm.  CHEST:  Clear.  BREASTS:  Soft and nontender.  ABDOMEN:  Gravid.  Her uterine contractions are every two minutes.  Her  fetal heart rate is overall reassuring with some variables with  contractions.  PELVIC:  Her pelvic exam on admission to the hospital was 9 cm, 90%, and  vertex at a 0 station with intact bulging membranes.  EXTREMITIES:  Within normal limits.   ASSESSMENT:  1. Intrauterine pregnancy at term.  2. Eminent delivery.  3. Positive group B Streptococcus.   PLAN:  Admit to  labor and delivery to anticipate a normal spontaneous  vaginal delivery.  Crist Fat Rivard, M.D., in attendance of patient.     Concha Pyo. Duplantis, C.N.M.              Crist Fat Rivard, M.D.    SJD/MEDQ  D:  07/11/2002  T:  07/11/2002  Job:  [id number]

## 2011-04-04 NOTE — H&P (Signed)
NAME:  Pam Hart, Pam Hart NO.:  [id number]   MEDICAL RECORD NO.:  [id number]          PATIENT TYPE:  OIB   LOCATION:  9316                          FACILITY:  [facility]   PHYSICIAN:  Hal Morales, M.D.DATE OF BIRTH:  1961/01/26   DATE OF ADMISSION:  04/24/2006  DATE OF DISCHARGE:  04/24/2006                                HISTORY & PHYSICAL   HISTORY OF PRESENT ILLNESS:  The patient is a 50 year old [place] married  female, para 4-1-0-4, who presents for concerns around pelvic relaxation.  Her concerns are two-fold.  First of all, she states that her husband  complains that sex is no longer enjoyable because her vaginal opening is too  large.  The patient has had four spontaneous vaginal deliveries and had  prior to her vaginal deliveries, had a female circumcision.  She states that  she wants to have her perineum repaired to decrease the caliber of the  vaginal opening.  Her second concern is that of laxity in the posterior  vagina, such that she sometimes has to press digitally to empty her bowel.  She denies any bowel incontinence and does complain of constipation.   The patient also wishes to have her IUD removed.  Her husband has complained  initially that the IUD string was too long and he could feel it during  intercourse, then that the IUD string was too short after it had been cut  and that it causes him discomfort.  The IUD string was then tucked into the  cervix and the patient states that this still did not solve the problem and  lead to her husband's satisfaction, so she wants the IUD removed.   PAST MEDICAL HISTORY:  Negative.   OBSTETRICAL HISTORY:  The patient has had four spontaneous vaginal  deliveries and one non-viable 25 week delivery in 1994.  This was thought to  be due to severe pre-eclampsia and the infant was stillborn.   GYNECOLOGICAL HISTORY:  The patient had a female circumcision with  clitorectomy, but no infibulation which was done  in [place].   FAMILY HISTORY:  Positive for cardiovascular disease and diabetes.   SOCIAL HISTORY:  The patient is married and of the Islamic faith.  She  denies any drug use, alcohol, or smoking.   REVIEW OF SYSTEMS:  As noted above.   CURRENT MEDICATIONS:  None.   DRUG SENSITIVITIES:  None.   PHYSICAL EXAMINATION:  GENERAL:  The patient is a well-developed African  female in no acute distress.  VITAL SIGNS:  Blood pressure is 130/80.  LUNGS:  Clear.  HEART:  Regular rate and rhythm.  ABDOMEN:  Benign without masses, organomegaly.  PELVIC:  Relaxed perineal body and a well-healed anterior female  circumcision and clitorectomy.  The vagina is rugous with a second degree  rectocele.  The cervix is without gross lesions.  The uterus is normal size,  shape, consistency, mobile, and nontender with some descensus with Valsalva.  Adnexal region with no masses.  Rectovaginal with no masses.  No enterocele  noted.   IMPRESSION:  1.  Pelvic relaxation.  2.  Concerns around husband's sexual dissatisfaction.   DISPOSITION:  A discussion is held with the patient concerning the surgical  procedures that she requests which are posterior repair and perineoplasty.  The patient specifically declines complete repair of the pelvic relaxation  that is noted on examination.  She specifically requests removal of her IUD,  posterior repair, and perineoplasty.  I have reviewed with the patient the  risks of anesthesia, bleeding, infection, and damage to adjacent organs.  I  reviewed in detail the additional risks of inability to promise that sexual  satisfaction for her husband will result from these surgical procedures.  She states that she understands and wishes to proceed.  She will thus have  removal of her IUD, posterior repair, and perineoplasty at [facility]  on April 24, 2006.      Hal Morales, M.D.  Electronically Signed     VPH/MEDQ  D:  04/30/2006  T:  04/30/2006  Job:   [id number]

## 2011-04-04 NOTE — Op Note (Signed)
NAME:  Pam Hart, Pam Hart                         ACCOUNT NO.:  [id number]   MEDICAL RECORD NO.:  [id number]                   PATIENT TYPE:  AMB   LOCATION:  DSC                                  FACILITY:  [facility]   PHYSICIAN:  Almedia Balls. Ranell Patrick, M.D.              DATE OF BIRTH:  1961-10-28   DATE OF PROCEDURE:  09/29/2002  DATE OF DISCHARGE:                                 OPERATIVE REPORT   PREOPERATIVE DIAGNOSIS:  Left carpal tunnel syndrome.   POSTOPERATIVE DIAGNOSIS:  Left carpal tunnel syndrome.   PROCEDURE:  Left carpal tunnel release.   SURGEON:  Almedia Balls. Ranell Patrick, M.D.   ANESTHESIA:  Local anesthesia plus MAC was used.   ESTIMATED BLOOD LOSS:  Minimal.   TOURNIQUET TIME:  20 minutes.   COUNTS:  Instrument count was correct.   COMPLICATIONS:  There were no complications.   PREOPERATIVE MEDICATIONS:  Operative antibiotics were given.   INDICATIONS:  The patient is a 50 year old female who presents with profound  carpal tunnel syndrome.  The patient has positive EMG and nerve conduction  studies and also had significant thenar wasting.  The patient has recently  undergone right carpal tunnel with no complications.  He presents now for  staged release for the left side.  Operative consent was obtained after  risks and benefits were discussed.   DESCRIPTION OF PROCEDURE:  After an adequate level of anesthesia was  achieved, the patient was positioned supine on the operating table.  A  nonsterile tourniquet was placed on the left proximal arm.  The left hand  and forearm were then prepped and draped in their entirety in the usual  sterile fashion.  After exsanguination with an Esmarch bandage, the  tourniquet was elevated to 300 mmHg.  A longitudinal skin incision was  created in the patient's palm in line with the fourth ray.  This was taken  down sharply through the subcutaneous tissue using loupe magnification.  The  superficial palmar fascia was identified and incised  in the line with the  skin incision revealing the transverse carpal ligament which was incised  under direct visualization protecting the median nerve with a Kelly clamp.  The release was performed into the mid palmar space and also into the  superficial forearm fascia.  Subsequently, the complete release was  obtained.  The nerve was inspected.  Several adhesive bands were released  again under loupe magnification.  The nerve was noted to have wasp-waist  deformity but was in continuity.  The wound was irrigated and closed using 4-  0 nylon.  A sterile dressing followed by a short arm splint was applied.  The patient tolerated the surgery well.  Almedia Balls. Ranell Patrick, M.D.    SRN/MEDQ  D:  09/29/2002  T:  09/29/2002  Job:  [id number]

## 2011-04-04 NOTE — H&P (Signed)
[facility]  Patient:    Pam Hart [facility]                 MRN: [id number] Adm. Date:  [id number] Attending:  Pleas Koch Dictator:   Miguel Dibble, C.N.M.                         History and Physical  HISTORY OF PRESENT ILLNESS:   This is a 50 year old, gravida 4, para 2-1-0-2, at 39-6/[redacted] weeks pregnant who is admitted for induction.  Her cervix was 3 cm, 50% effaced.  She was being evaluated for decreased fetal movement and large for gestational age baby.  In her last ultrasound of last week is currently being induced.  PRENATAL LABORATORY DATA:     Hemoglobin 11.8, hematocrit 35.2, platelets 274, blood type and rh B positive, rh antibodies negative, sickle cell trait negative, VDRL nonreactive, rubella titer immune, hepatitis B surface antigen negative, Pap smear within normal limits, gonorrhea and Chlamydia cultures negative, Glucose challenge test is 92.  Group B Strep is negative.  She has had a pregnancy at risk due to late entrance into prenatal care.  She moved from [place].  She has a history of preeclampsia with previous baby and a stillbirth.  ALLERGIES:                    No known drug allergies.  PAST MEDICAL HISTORY:         Appendectomy in the 1970s.  Intermittent anemia with treatment.  Received a partial female circumcision as a child which is primarily a clitorectomy.  FAMILY HISTORY:               Mother and maternal aunt with MI.  Strong family history of MIs on the mothers side.  Hypertension on the mothers side of the family.  Mother is a diabetic.  The patient is 50 years old, has declined amniocentesis.  She is African from [place], Isla[place] religion, married to [employer].  She is a Futures trader.  Father of the baby works for [employer]. Stable, monogomous relationship.  Denies smoking, alcohol, or drug abuse.  PAST OBSTETRIC HISTORY:       In June of 1994, stillbirth of 25-week baby.   She  experienced severe preeclampsia.  In August of 1995, normal spontaneous vaginal  delivery of a female weighing 7 to 8 pounds at 40 weeks after 10 to 12 hours of  labor in [place].  In November of 1997, normal spontaneous vaginal delivery of a ale weighing 6 to 7 pounds at 42 weeks after five to six hours of labor in [place]. his is her fourth pregnancy.  PHYSICAL EXAMINATION:  HEENT:                        Within normal limits.  LUNGS:                        Bilaterally clear.  HEART:                        Regular rate and rhythm.  ABDOMEN:                      Soft and nontender.  No contractions currently.  PELVIC:  Cervix is 3 cm, 75% effaced, vertex by last ultrasound she was large for gestational age.  ASSESSMENT:                   Multiparity at term with possible macrosomia.  PLAN:                         Admit to labor and delivery.  Plan for M.D. management per patient request.  Georgina Peer, M.D. will manage.  Routine [facility] and Gynecology orders.  Anticipate normal spontaneous vaginal delivery. DD:  12/02/99 TD:  12/02/99 Job: [id number]

## 2011-04-04 NOTE — Op Note (Signed)
NAME:  Pam Hart, Pam Hart NO.:  [id number]   MEDICAL RECORD NO.:  [id number]          PATIENT TYPE:  AMB   LOCATION:  SDC                           FACILITY:  [facility]   PHYSICIAN:  Hal Morales, M.D.DATE OF BIRTH:  09-28-1961   DATE OF PROCEDURE:  04/24/2006  DATE OF DISCHARGE:                                 OPERATIVE REPORT   PREOPERATIVE DIAGNOSES:  1.  Rectocele.  2.  Bowel dysfunction.  3.  Intrauterine device in place, with desire for removal.  4.  Concern with size of vaginal opening.   POSTOPERATIVE DIAGNOSES:  1.  Rectocele.  2.  Bowel dysfunction.  3.  Intrauterine device in place, with desire for removal.  4.  Concern with size of vaginal opening.   OPERATION:  Posterior repair, perineoplasty, and IUD removal.   SURGEON:  Hal Morales, M.D.   FIRST ASSISTANT:  Elmira J. Lowell Guitar, Transport planner.   ANESTHESIA:  General LMA.   SPECIMENS TO PATHOLOGY:  None.   ESTIMATED BLOOD LOSS:  50 cc.   COMPLICATIONS:  None.   FINDINGS:  There was a posterior bulge in the vagina consistent with a first-  degree rectocele.  The perineal body was relaxed status post 4 vaginal  deliveries.  The Mirena IUD was in place in the uterus, with the IUD string  visible.   PROCEDURE:  The patient was taken to the operating room after appropriate  identification and placed on the operating table.  After the attainment of  adequate general anesthesia, she was placed in the lithotomy position.  The  perineum and vagina were prepped with multiple layers of Betadine and draped  as a sterile field.  A Silastic catheter was placed in the bladder and  connected to straight drainage.  The posterior fourchette and posterior  vaginal mucosa were injected first with 0.25% Marcaine, then with a dilute  solution of Pitressin.  The posterior fourchette was incised, and the  posterior vaginal mucosa incised along the midline as the vaginal mucosa was  dissected  off the rectal propria fascia.  This was done to a level  approximately 5 cm above the posterior fourchette.  The perineum was  likewise incised for a distance of approximately 2 cm.  The vaginal mucosa  was dissected off, and levator ani muscles could be palpated laterally.  Sutures of 0 Vicryl were used to reapproximate the levators over the propria  fascia, and in so doing reduced the bulge in the posterior vagina.  The  sutures were placed along the length of the incision in the vaginal mucosa  down to the perineal body.  The perineal body was then rebuilt with sutures  from the apex of the perineal incision up to the posterior fourchette.  The  redundant posterior vaginal mucosa was excised, and the remaining vaginal  mucosa reapproximated with running interlocking sutures of 2-0 Vicryl.  The  skin in the perineal body was then reapproximated with subcuticular sutures  of 2-0 Vicryl.  Two-inch vaginal packing was then placed in the vagina and  the patient awakened from general  anesthesia after her Foley catheter had  been removed and taken to the recovery room in satisfactory condition,  having tolerated the procedure well, with sponge and instrument counts  correct.  Postoperative instructions include the patient removing the  vaginal packing on April 25, 2006.  Other instructions are printed  instructions for minor surgery from the [facility] [place].  The  patient is to refrain from intercourse or placing anything in the vagina for  6 weeks.  Her postoperative followup is May 05, 2006 at 3:00 p.m.   POSTOPERATIVE MEDICATIONS:  1.  Vicodin 1-2 tablets q.4 h. as needed for pain.  2.  Ibuprofen 600 mg p.o. q.6 h. for 3 days, then as needed for pain.      Hal Morales, M.D.  Electronically Signed     VPH/MEDQ  D:  04/24/2006  T:  04/24/2006  Job:  [id number]

## 2011-04-22 ENCOUNTER — Ambulatory Visit
Admission: RE | Admit: 2011-04-22 | Discharge: 2011-04-22 | Disposition: A | Discharge status: Home / Self Care | Payer: Self-pay | Source: Ambulatory Visit | Attending: Family Medicine | Admitting: Family Medicine

## 2011-04-22 ENCOUNTER — Other Ambulatory Visit: Payer: Self-pay | Admitting: Family Medicine

## 2011-04-22 DIAGNOSIS — R928 Other abnormal and inconclusive findings on diagnostic imaging of breast: Secondary | ICD-10-CM

## 2011-05-27 ENCOUNTER — Inpatient Hospital Stay (INDEPENDENT_AMBULATORY_CARE_PROVIDER_SITE_OTHER)
Admission: RE | Admit: 2011-05-27 | Discharge: 2011-05-27 | Disposition: A | Discharge status: Home / Self Care | Payer: Self-pay | Source: Ambulatory Visit | Attending: Family Medicine | Admitting: Family Medicine

## 2011-05-27 DIAGNOSIS — M549 Dorsalgia, unspecified: Secondary | ICD-10-CM

## 2011-05-27 LAB — POCT URINALYSIS DIP (DEVICE)
Bilirubin Urine: NEGATIVE
Glucose, UA: NEGATIVE mg/dL
Ketones, ur: NEGATIVE mg/dL
Leukocytes, UA: NEGATIVE
Nitrite: NEGATIVE
pH: 5.5 (ref 5.0–8.0)

## 2011-07-01 ENCOUNTER — Ambulatory Visit (HOSPITAL_COMMUNITY)
Admission: RE | Admit: 2011-07-01 | Discharge: 2011-07-01 | Disposition: A | Discharge status: Home / Self Care | Payer: Self-pay | Source: Ambulatory Visit | Attending: Internal Medicine | Admitting: Internal Medicine

## 2011-07-01 ENCOUNTER — Other Ambulatory Visit: Payer: Self-pay | Admitting: Internal Medicine

## 2011-07-01 DIAGNOSIS — M542 Cervicalgia: Secondary | ICD-10-CM | POA: Insufficient documentation

## 2011-07-01 DIAGNOSIS — R52 Pain, unspecified: Secondary | ICD-10-CM

## 2011-07-01 DIAGNOSIS — M545 Low back pain, unspecified: Secondary | ICD-10-CM | POA: Insufficient documentation

## 2011-07-02 ENCOUNTER — Other Ambulatory Visit: Payer: Self-pay | Admitting: Physician Assistant

## 2011-08-06 ENCOUNTER — Ambulatory Visit: Payer: Self-pay | Admitting: Physical Therapy

## 2011-11-06 ENCOUNTER — Ambulatory Visit (INDEPENDENT_AMBULATORY_CARE_PROVIDER_SITE_OTHER): Payer: BC Managed Care – PPO

## 2011-11-06 DIAGNOSIS — S335XXA Sprain of ligaments of lumbar spine, initial encounter: Secondary | ICD-10-CM

## 2011-11-06 DIAGNOSIS — S139XXA Sprain of joints and ligaments of unspecified parts of neck, initial encounter: Secondary | ICD-10-CM

## 2011-11-06 DIAGNOSIS — I10 Essential (primary) hypertension: Secondary | ICD-10-CM

## 2012-02-17 ENCOUNTER — Ambulatory Visit: Payer: BC Managed Care – PPO

## 2012-02-17 ENCOUNTER — Ambulatory Visit (INDEPENDENT_AMBULATORY_CARE_PROVIDER_SITE_OTHER): Payer: BC Managed Care – PPO | Admitting: Family Medicine

## 2012-02-17 VITALS — BP 163/84 | HR 64 | Temp 98.5°F | Resp 16 | Ht 61.0 in | Wt 145.0 lb

## 2012-02-17 DIAGNOSIS — M25449 Effusion, unspecified hand: Secondary | ICD-10-CM

## 2012-02-17 DIAGNOSIS — R6889 Other general symptoms and signs: Secondary | ICD-10-CM

## 2012-02-17 DIAGNOSIS — B9789 Other viral agents as the cause of diseases classified elsewhere: Secondary | ICD-10-CM

## 2012-02-17 DIAGNOSIS — J329 Chronic sinusitis, unspecified: Secondary | ICD-10-CM

## 2012-02-17 DIAGNOSIS — M79641 Pain in right hand: Secondary | ICD-10-CM

## 2012-02-17 DIAGNOSIS — M79609 Pain in unspecified limb: Secondary | ICD-10-CM

## 2012-02-17 LAB — POCT INFLUENZA A/B
Influenza A, POC: NEGATIVE
Influenza B, POC: NEGATIVE

## 2012-02-17 MED ORDER — BENZONATATE 100 MG PO CAPS: 200.0000 mg | ORAL_CAPSULE | Freq: Two times a day (BID) | ORAL | Status: AC | PRN

## 2012-02-17 MED ORDER — MELOXICAM 15 MG PO TABS: 15.0000 mg | ORAL_TABLET | Freq: Every day | ORAL | Status: AC

## 2012-02-17 MED ORDER — AZITHROMYCIN 250 MG PO TABS: ORAL_TABLET | ORAL | Status: AC

## 2012-02-17 NOTE — Progress Notes (Signed)
[facility]:  Office Visit  Chief Complaint:  Chief Complaint  Patient presents with  . Hand Pain  . Nasal Congestion  . Sinusitis    HPI: Pam Hart is a 51 y.o. female who complains of : 1. Sinusitis-3 day h/o of chills, msk pain, chills, cough. No fevers. Tried OTC meds with decongestant and has not had any releif.  2. Right  1st MCP swelling and pain, h/o RA in family ( aunt), RA was tested 2-3 years back and was normal sop was ANA. Patient insists that she has RA. She has been on relafen and flexeril for msk and bone pain. Joint pain is sharp, 6-9/10 pain, priamrily in mornings. She also has pain similar in her left 1st MCP and also in her bilateral great toes. Unable to determine if gets better through out course of day and/or with use. Denies any rashes, history of gout.   Past Medical History  Diagnosis Date  . Arthritis   . Anemia   . Thyroid disease    Past Surgical History  Procedure Date  . Appendectomy    History   Social History  . Marital Status: Married    Spouse Name: N/A    Number of Children: N/A  . Years of Education: N/A   Social History Main Topics  . Smoking status: Never Smoker   . Smokeless tobacco: None  . Alcohol Use: No  . Drug Use: No  . Sexually Active: None   Other Topics Concern  . None   Social History Narrative  . None   Family History  Problem Relation Age of Onset  . Arthritis Maternal Aunt    No Known Allergies Prior to Admission medications   Medication Sig Start Date End Date Taking? Authorizing Provider  cyclobenzaprine (FLEXERIL) 10 MG tablet Take 10 mg by mouth 3 (three) times daily as needed.   Yes Historical Provider, MD  nabumetone (RELAFEN) 500 MG tablet Take 500 mg by mouth 2 (two) times daily as needed.   Yes Historical Provider, MD  triamterene-hydrochlorothiazide (MAXZIDE-25) 37.5-25 MG per tablet Take 1 tablet by mouth daily.   Yes Historical Provider, MD     ROS: The patient denies  fevers, night sweats, unintentional weight loss, chest pain, palpitations, wheezing, dyspnea on exertion, nausea, vomiting, abdominal pain, dysuria, hematuria, melena, numbness, weakness, or tingling. + chills, msk and bone pain  All other systems have been reviewed and were otherwise negative with the exception of those mentioned in the HPI and as above.    PHYSICAL EXAM: Filed Vitals:   02/17/12 1746  BP: 163/84  Pulse: 64  Temp: 98.5 F (36.9 C)  Resp: 16   Filed Vitals:   02/17/12 1746  Height: 5\' 1"  (1.549 m)  Weight: 145 lb (65.772 kg)   Body mass index is 27.40 kg/(m^2).  General: Alert, no acute distress HEENT:  Normocephalic, atraumatic, oropharynx patent. EOMI, PERRLA, + sinus pressure max bialteral, TM nl, boggy red nares, no exudates.  Cardiovascular:  Regular rate and rhythm, no rubs murmurs or gallops.  No Carotid bruits, radial pulse intact. No pedal edema.  Respiratory: Clear to auscultation bilaterally.  No wheezes, rales, or rhonchi.  No cyanosis, no use of accessory musculature GI: No organomegaly, abdomen is soft and non-tender, positive bowel sounds.  No masses. Skin: No rashes. Neurologic: Facial musculature symmetric. Psychiatric: Patient is appropriate throughout our interaction. Lymphatic: No cervical lymphadenopathy Musculoskeletal: Gait intact. Right hand: 1st MCP swelling, tender with adduction,  full ROM, +2 radial pulse, sesnation intact, 5/5 strength, No evidence of other PIP or DIP nodules.    LABS: Results for orders placed in visit on 02/17/12  POCT INFLUENZA A/B      Component Value Range   Influenza A, POC Negative     Influenza B, POC Negative       EKG/XRAY:   Primary read interpreted by [facility]. Conley Rolls at [facility]. N fx or dislocation, ? Minimal DJD on 1st MCP of Right hand   ASSESSMENT/PLAN: Encounter Diagnoses  Name Primary?  . Flu-like symptoms Yes  . Hand pain, right   . Sinusitis    Sxs treatment with floanse and tessalon perles. If  no improvement may take Z-pack.  Hand pain most likely OA however patient insists she gets tested for RA, will get serum RF. Rx MObic 15 mg daily.  F/u in 1 month if no improvement     Palak Tercero PHUONG, DO 02/18/2012 11:38 AM

## 2012-02-18 ENCOUNTER — Encounter: Payer: Self-pay | Admitting: Family Medicine

## 2012-02-18 LAB — RHEUMATOID FACTOR: Rheumatoid fact SerPl-aCnc: <10 [IU]/mL (ref <=14)

## 2012-02-20 ENCOUNTER — Encounter: Payer: Self-pay | Admitting: Family Medicine

## 2012-07-16 ENCOUNTER — Ambulatory Visit (INDEPENDENT_AMBULATORY_CARE_PROVIDER_SITE_OTHER): Payer: BC Managed Care – PPO | Admitting: Family Medicine

## 2012-07-16 VITALS — BP 126/76 | HR 67 | Temp 97.9°F | Resp 16 | Ht 60.5 in | Wt 143.0 lb

## 2012-07-16 DIAGNOSIS — I10 Essential (primary) hypertension: Secondary | ICD-10-CM

## 2012-07-16 DIAGNOSIS — R5381 Other malaise: Secondary | ICD-10-CM

## 2012-07-16 DIAGNOSIS — R531 Weakness: Secondary | ICD-10-CM

## 2012-07-16 LAB — POCT URINALYSIS DIPSTICK
Bilirubin, UA: NEGATIVE
Blood, UA: NEGATIVE
Glucose, UA: NEGATIVE
Ketones, UA: NEGATIVE
Leukocytes, UA: NEGATIVE
Nitrite, UA: NEGATIVE
Protein, UA: NEGATIVE
Spec Grav, UA: <=1.005
Urobilinogen, UA: 0.2
pH, UA: 5.5

## 2012-07-16 LAB — COMPREHENSIVE METABOLIC PANEL
ALT: 25 U/L (ref 0–35)
AST: 21 U/L (ref 0–37)
Albumin: 4.6 g/dL (ref 3.5–5.2)
Alkaline Phosphatase: 71 U/L (ref 39–117)
BUN: 10 mg/dL (ref 6–23)
CO2: 32 mEq/L (ref 19–32)
Calcium: 10.1 mg/dL (ref 8.4–10.5)
Chloride: 98 mEq/L (ref 96–112)
Creat: 0.76 mg/dL (ref 0.50–1.10)
Glucose, Bld: 88 mg/dL (ref 70–99)
Potassium: 3.5 mEq/L (ref 3.5–5.3)
Sodium: 139 mEq/L (ref 135–145)
Total Bilirubin: 0.4 mg/dL (ref 0.3–1.2)
Total Protein: 7.6 g/dL (ref 6.0–8.3)

## 2012-07-16 LAB — POCT UA - MICROSCOPIC ONLY
Casts, Ur, LPF, POC: NEGATIVE
Crystals, Ur, HPF, POC: NEGATIVE
Mucus, UA: NEGATIVE
RBC, urine, microscopic: NEGATIVE
Yeast, UA: NEGATIVE

## 2012-07-16 LAB — POCT CBC
Granulocyte percent: 57.6 %G (ref 37–80)
HCT, POC: 47.1 % (ref 37.7–47.9)
Hemoglobin: 14.6 g/dL (ref 12.2–16.2)
Lymph, poc: 3.4 (ref 0.6–3.4)
MCH, POC: 26.4 pg — AB (ref 27–31.2)
MCHC: 31 g/dL — AB (ref 31.8–35.4)
MCV: 85.1 fL (ref 80–97)
MID (cbc): 0.6 (ref 0–0.9)
MPV: 10.2 fL (ref 0–99.8)
POC Granulocyte: 5.4 (ref 2–6.9)
POC LYMPH PERCENT: 36.3 %L (ref 10–50)
POC MID %: 6.1 %M (ref 0–12)
Platelet Count, POC: 399 10*3/uL (ref 142–424)
RBC: 5.54 M/uL — AB (ref 4.04–5.48)
RDW, POC: 14.5 %
WBC: 9.4 10*3/uL (ref 4.6–10.2)

## 2012-07-16 LAB — TSH: TSH: 1.407 u[IU]/mL (ref 0.350–4.500)

## 2012-07-16 LAB — GLUCOSE, POCT (MANUAL RESULT ENTRY): POC Glucose: 84 mg/dl (ref 70–99)

## 2012-07-16 LAB — POCT SEDIMENTATION RATE: POCT SED RATE: 31 mm/hr — AB (ref 0–22)

## 2012-07-16 MED ORDER — TRIAMTERENE-HCTZ 37.5-25 MG PO TABS: 1.0000 | ORAL_TABLET | Freq: Every day | ORAL | Status: DC

## 2012-07-16 NOTE — Progress Notes (Signed)
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 Earlier in the week she noted blood pressure was elevated and started on blood pressure medicine thinking that the elevated blood pressure was making her weak. Nevertheless the weakness has persisted without nausea vomiting, diarrhea, chest pain, shortness of breath, polyuria, dysuria, fever, or vaginal bleeding. Her last menstrual period was over 2 years ago. She has had some myalgia this week and her torso.  I reviewed the chart which suggests patient has had thyroid disease in the past which she now does denies saying that this was a mistake  Objective: Alert, no acute distress HEENT: Unremarkable Blood pressure: 110/60 sitting, 106/60 standing Chest: Clear Heart: Regular Neck: Supple no adenopathy  Results for orders placed in visit on 07/16/12  POCT CBC      Component Value Range   WBC 9.4  4.6 - 10.2 K/uL   Lymph, poc 3.4  0.6 - 3.4   POC LYMPH PERCENT 36.3  10 - 50 %L   MID (cbc) 0.6  0 - 0.9   POC MID % 6.1  0 - 12 %M   POC Granulocyte 5.4  2 - 6.9   Granulocyte percent 57.6  37 - 80 %G   RBC 5.54 (*) 4.04 - 5.48 M/uL   Hemoglobin 14.6  12.2 - 16.2 g/dL   HCT, POC 16.1  09.6 - 47.9 %   MCV 85.1  80 - 97 fL   MCH, POC 26.4 (*) 27 - 31.2 pg   MCHC 31.0 (*) 31.8 - 35.4 g/dL   RDW, POC 04.5     Platelet Count, POC 399  142 - 424 K/uL   MPV 10.2  0 - 99.8 fL  POCT URINALYSIS DIPSTICK      Component Value Range   Color, UA yellow     Clarity, UA clear     Glucose, UA neg     Bilirubin, UA neg     Ketones, UA neg     Spec Grav, UA <=1.005     Blood, UA neg     pH, UA 5.5     Protein, UA neg     Urobilinogen, UA 0.2     Nitrite, UA neg     Leukocytes, UA Negative    POCT UA - MICROSCOPIC ONLY      Component Value Range   WBC, Ur, HPF, POC 0-1     RBC, urine, microscopic neg     Bacteria, U Microscopic 1+ cocci     Mucus, UA neg     Epithelial cells, urine per micros 0-3     Crystals, Ur, HPF, POC  neg     Casts, Ur, LPF, POC neg     Yeast, UA neg    GLUCOSE, POCT (MANUAL RESULT ENTRY)      Component Value Range   POC Glucose 84  70 - 99 mg/dl   Assessment:  This may be hypokalemia or a viral infection.  At this point, I've asked patient to hold off on Maxzide, take B vitamins and we'll get  Back with her tomorrow when CMET available  1. Weakness  POCT CBC, POCT urinalysis dipstick, POCT UA - Microscopic Only, POCT glucose (manual entry), TSH, POCT SEDIMENTATION RATE, Comprehensive metabolic panel  2. Hypertension  triamterene-hydrochlorothiazide (MAXZIDE-25) 37.5-25 MG per tablet

## 2012-10-21 ENCOUNTER — Encounter (INDEPENDENT_AMBULATORY_CARE_PROVIDER_SITE_OTHER): Payer: BC Managed Care – PPO | Admitting: Ophthalmology

## 2012-10-21 DIAGNOSIS — H43819 Vitreous degeneration, unspecified eye: Secondary | ICD-10-CM

## 2012-10-21 DIAGNOSIS — H2510 Age-related nuclear cataract, unspecified eye: Secondary | ICD-10-CM

## 2012-10-21 DIAGNOSIS — I10 Essential (primary) hypertension: Secondary | ICD-10-CM

## 2012-10-21 DIAGNOSIS — H348392 Tributary (branch) retinal vein occlusion, unspecified eye, stable: Secondary | ICD-10-CM

## 2012-10-21 DIAGNOSIS — H35039 Hypertensive retinopathy, unspecified eye: Secondary | ICD-10-CM

## 2012-12-03 ENCOUNTER — Emergency Department (INDEPENDENT_AMBULATORY_CARE_PROVIDER_SITE_OTHER): Payer: BC Managed Care – PPO

## 2012-12-03 ENCOUNTER — Emergency Department (INDEPENDENT_AMBULATORY_CARE_PROVIDER_SITE_OTHER)
Admission: EM | Admit: 2012-12-03 | Discharge: 2012-12-03 | Disposition: A | Discharge status: Home / Self Care | Payer: Self-pay | Source: Home / Self Care

## 2012-12-03 ENCOUNTER — Encounter (HOSPITAL_COMMUNITY): Payer: Self-pay | Admitting: *Deleted

## 2012-12-03 DIAGNOSIS — S6390XA Sprain of unspecified part of unspecified wrist and hand, initial encounter: Secondary | ICD-10-CM

## 2012-12-03 DIAGNOSIS — IMO0001 Reserved for inherently not codable concepts without codable children: Secondary | ICD-10-CM

## 2012-12-03 DIAGNOSIS — M542 Cervicalgia: Secondary | ICD-10-CM

## 2012-12-03 DIAGNOSIS — S20219A Contusion of unspecified front wall of thorax, initial encounter: Secondary | ICD-10-CM

## 2012-12-03 DIAGNOSIS — S63619A Unspecified sprain of unspecified finger, initial encounter: Secondary | ICD-10-CM

## 2012-12-03 DIAGNOSIS — M791 Myalgia, unspecified site: Secondary | ICD-10-CM

## 2012-12-03 MED ORDER — MELOXICAM 15 MG PO TABS: 15.0000 mg | ORAL_TABLET | Freq: Every day | ORAL | Status: DC
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
## 2012-12-03 NOTE — ED Provider Notes (Signed)
Medical screening examination/treatment/procedure(s) were performed by resident physician or non-physician practitioner and as supervising physician I was immediately available for consultation/collaboration.   Phoebie Shad DOUGLAS MD.    Cayson Kalb D Coran Dipaola, MD 12/03/12 2029 
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
## 2013-03-07 ENCOUNTER — Ambulatory Visit (INDEPENDENT_AMBULATORY_CARE_PROVIDER_SITE_OTHER): Payer: BC Managed Care – PPO | Admitting: Internal Medicine

## 2013-03-07 VITALS — BP 118/70 | HR 66 | Temp 98.4°F | Resp 18 | Ht 60.75 in | Wt 148.0 lb

## 2013-03-07 DIAGNOSIS — R5381 Other malaise: Secondary | ICD-10-CM

## 2013-03-07 DIAGNOSIS — R002 Palpitations: Secondary | ICD-10-CM

## 2013-03-07 DIAGNOSIS — I10 Essential (primary) hypertension: Secondary | ICD-10-CM

## 2013-03-07 DIAGNOSIS — R5383 Other fatigue: Secondary | ICD-10-CM

## 2013-03-07 DIAGNOSIS — R0602 Shortness of breath: Secondary | ICD-10-CM

## 2013-03-07 DIAGNOSIS — R42 Dizziness and giddiness: Secondary | ICD-10-CM

## 2013-03-07 LAB — POCT CBC
Granulocyte percent: 49.3 %G (ref 37–80)
HCT, POC: 41.6 % (ref 37.7–47.9)
POC Granulocyte: 3.5 (ref 2–6.9)
POC LYMPH PERCENT: 41.3 %L (ref 10–50)
Platelet Count, POC: 309 10*3/uL (ref 142–424)
RBC: 5.02 M/uL (ref 4.04–5.48)
RDW, POC: 13.9 %

## 2013-03-07 MED ORDER — CHLORTHALIDONE 25 MG PO TABS: 25.0000 mg | ORAL_TABLET | Freq: Every day | ORAL | Status: DC

## 2013-03-07 MED ORDER — AMLODIPINE BESYLATE 5 MG PO TABS: 5.0000 mg | ORAL_TABLET | Freq: Every day | ORAL | Status: DC

## 2013-03-07 NOTE — Progress Notes (Signed)
Subjective:    Patient ID: Pam Hart, female    DOB: Jan 28, 1961, 52 y.o.   MRN: [id number]  HPIdizzy 2 days as moves around Sob when still Palp off/on. No chest pain HA back of head and neck Sleeping a lot/tired often  BP at home=185/95 141/91 Taking medications regularly/has lost faith in her medications  Review of Systems Can't lose weight   no fever chills or night sweats Fatigue is noticed off-and-on  Objective:   Physical Exam BP 118/70  Pulse 66  Temp(Src) 98.4 F (36.9 C) (Oral)  Resp 18  Ht 5' 0.75" (1.543 m)  Wt 148 lb (67.132 kg)  BMI 28.2 kg/m2  SpO2 100% HEENT clear/thyroid prominent but no thyromegaly or nodules Heart regular without murmur click rub or gallop Lungs clear to auscultation Extremities without edema Good peripheral pulses Cranial nerves II through XII intact Cerebellar intact No motor or sensory losses and extremities  Results for orders placed in visit on 03/07/13  COMPREHENSIVE METABOLIC PANEL      Result Value Range   Sodium 140  135 - 145 mEq/L   Potassium 4.0  3.5 - 5.3 mEq/L   Chloride 103  96 - 112 mEq/L   CO2 30  19 - 32 mEq/L   Glucose, Bld 93  70 - 99 mg/dL   BUN 13  6 - 23 mg/dL   Creat 6.21  3.08 - 6.57 mg/dL   Total Bilirubin 0.3  0.3 - 1.2 mg/dL   Alkaline Phosphatase 67  39 - 117 U/L   AST 18  0 - 37 U/L   ALT 19  0 - 35 U/L   Total Protein 6.6  6.0 - 8.3 g/dL   Albumin 4.0  3.5 - 5.2 g/dL   Calcium 9.1  8.4 - 84.6 mg/dL  LIPID PANEL      Result Value Range   Cholesterol 235 (*) 0 - 200 mg/dL   Triglycerides 962 (*) <150 mg/dL   HDL 47  >95 mg/dL   Total CHOL/HDL Ratio 5.0     VLDL 48 (*) 0 - 40 mg/dL   LDL Cholesterol 284 (*) 0 - 99 mg/dL  POCT CBC      Result Value Range   WBC 7.2  4.6 - 10.2 K/uL   Lymph, poc 3.0  0.6 - 3.4   POC LYMPH PERCENT 41.3  10 - 50 %L   MID (cbc) 0.7  0 - 0.9   POC MID % 9.4  0 - 12 %M   POC Granulocyte 3.5  2 - 6.9   Granulocyte percent 49.3  37 - 80 %G   RBC 5.02   4.04 - 5.48 M/uL   Hemoglobin 12.9  12.2 - 16.2 g/dL   HCT, POC 13.2  44.0 - 47.9 %   MCV 82.9  80 - 97 fL   MCH, POC 25.7 (*) 27 - 31.2 pg   MCHC 31.0 (*) 31.8 - 35.4 g/dL   RDW, POC 10.2     Platelet Count, POC 309  142 - 424 K/uL   MPV 10.0  0 - 99.8 fL        Assessment & Plan:  HTN (hypertension) - Plan: POCT CBC, Comprehensive metabolic panel, Lipid panel  Dizzy  Other malaise and fatigue  SOB (shortness of breath)  Palpitations  Labs are essentially normal except for hyperlipidemia Hypertension suggested as labile with home blood pressures being so abnormal so we'll change medication to follow response Meds ordered this encounter  Medications  . amLODipine (NORVASC) 5 MG tablet    Sig: Take 1 tablet (5 mg total) by mouth daily.    Dispense:  90 tablet    Refill:  3  . chlorthalidone (HYGROTON) 25 MG tablet    Sig: Take 1 tablet (25 mg total) by mouth daily.    Dispense:  90 tablet    Refill:  1   daily blood pressures in the morning Recheck one month

## 2013-03-08 LAB — COMPREHENSIVE METABOLIC PANEL
AST: 18 U/L (ref 0–37)
Albumin: 4 g/dL (ref 3.5–5.2)
Alkaline Phosphatase: 67 U/L (ref 39–117)
Calcium: 9.1 mg/dL (ref 8.4–10.5)
Chloride: 103 mEq/L (ref 96–112)
Glucose, Bld: 93 mg/dL (ref 70–99)
Potassium: 4 mEq/L (ref 3.5–5.3)
Sodium: 140 mEq/L (ref 135–145)
Total Protein: 6.6 g/dL (ref 6.0–8.3)

## 2013-03-08 LAB — LIPID PANEL: LDL Cholesterol: 140 mg/dL — ABNORMAL HIGH (ref 0–99)

## 2013-03-10 ENCOUNTER — Encounter: Payer: Self-pay | Admitting: Internal Medicine

## 2013-03-17 ENCOUNTER — Encounter: Payer: Self-pay | Admitting: Family Medicine

## 2013-03-17 ENCOUNTER — Ambulatory Visit (INDEPENDENT_AMBULATORY_CARE_PROVIDER_SITE_OTHER): Payer: BC Managed Care – PPO | Admitting: Family Medicine

## 2013-03-17 VITALS — BP 104/70 | HR 69 | Temp 98.0°F | Resp 16 | Ht 60.5 in | Wt 143.2 lb

## 2013-03-17 DIAGNOSIS — R5383 Other fatigue: Secondary | ICD-10-CM

## 2013-03-17 DIAGNOSIS — R5381 Other malaise: Secondary | ICD-10-CM

## 2013-03-17 DIAGNOSIS — I10 Essential (primary) hypertension: Secondary | ICD-10-CM

## 2013-03-17 DIAGNOSIS — M159 Polyosteoarthritis, unspecified: Secondary | ICD-10-CM

## 2013-03-17 DIAGNOSIS — E785 Hyperlipidemia, unspecified: Secondary | ICD-10-CM

## 2013-03-17 DIAGNOSIS — M1990 Unspecified osteoarthritis, unspecified site: Secondary | ICD-10-CM

## 2013-03-17 MED ORDER — MELOXICAM 7.5 MG PO TABS: 7.5000 mg | ORAL_TABLET | Freq: Every day | ORAL | Status: DC

## 2013-03-17 MED ORDER — ROSUVASTATIN CALCIUM 5 MG PO TABS: 5.0000 mg | ORAL_TABLET | Freq: Every day | ORAL | Status: DC

## 2013-03-17 MED ORDER — AMLODIPINE BESYLATE 2.5 MG PO TABS: 2.5000 mg | ORAL_TABLET | Freq: Every day | ORAL | Status: DC

## 2013-03-17 NOTE — Assessment & Plan Note (Signed)
Assessment: I suspect patient can get by with less medicine and so I've instructed her to break her amlodipine in half and start taking amlodipine 2.5 mg daily with the chlorthalidone 25 mg.  Plan: Recheck in 3 months after we also start her on her cholesterol medicine Signed, Sheila Oats.D.

## 2013-03-17 NOTE — Assessment & Plan Note (Signed)
Assessment: Age 52, patient is postmenopausal and has many more years of life ahead of her. But he's reasonable to start taking lipid lowering medicine at this point to avoid atherosclerotic complications later in life.  Plan: Crestor 5 mg daily, follow up 3 months, I explained to patient the fact that she may get some increased muscle soreness and that she should report this if it in fact happens. I also explained that diet has a very small affect on cholesterol but that she should try to avoid gaining weight and stay active.

## 2013-03-17 NOTE — Patient Instructions (Addendum)

## 2013-03-17 NOTE — Assessment & Plan Note (Signed)
Assessment: Chronic myalgias of undetermined significance which have been a major concern for patient.  Plan: Meloxicam 7.5 mg daily #30 with 3 refills per

## 2013-03-17 NOTE — Progress Notes (Signed)
See notes above under individual problem statements

## 2013-03-18 ENCOUNTER — Telehealth: Payer: Self-pay | Admitting: Radiology

## 2013-03-18 ENCOUNTER — Other Ambulatory Visit: Payer: Self-pay | Admitting: Family Medicine

## 2013-03-18 DIAGNOSIS — E785 Hyperlipidemia, unspecified: Secondary | ICD-10-CM

## 2013-03-18 MED ORDER — ATORVASTATIN CALCIUM 10 MG PO TABS: 10.0000 mg | ORAL_TABLET | Freq: Every day | ORAL | Status: DC

## 2013-03-18 NOTE — Telephone Encounter (Signed)
I changed med to lipitor

## 2013-03-18 NOTE — Telephone Encounter (Signed)
Thanks I have advised her 

## 2013-03-18 NOTE — Telephone Encounter (Signed)
PT CALLED REGARDING HER CRESTOR AND IT NEED TO BE PRIOR AUTHORIZATION PLEASE CALL [phone]

## 2013-03-18 NOTE — Telephone Encounter (Signed)
Insurance does not have Crestor on preferred list, do you want to change?

## 2013-04-21 ENCOUNTER — Ambulatory Visit (INDEPENDENT_AMBULATORY_CARE_PROVIDER_SITE_OTHER): Payer: BC Managed Care – PPO | Admitting: Ophthalmology

## 2013-05-17 ENCOUNTER — Other Ambulatory Visit: Payer: Self-pay | Admitting: Family Medicine

## 2013-05-31 ENCOUNTER — Other Ambulatory Visit: Payer: Self-pay | Admitting: Physical Medicine and Rehabilitation

## 2013-05-31 DIAGNOSIS — M5020 Other cervical disc displacement, unspecified cervical region: Secondary | ICD-10-CM

## 2013-05-31 DIAGNOSIS — M542 Cervicalgia: Secondary | ICD-10-CM

## 2013-05-31 DIAGNOSIS — M47812 Spondylosis without myelopathy or radiculopathy, cervical region: Secondary | ICD-10-CM

## 2013-05-31 DIAGNOSIS — M5412 Radiculopathy, cervical region: Secondary | ICD-10-CM

## 2013-06-05 ENCOUNTER — Ambulatory Visit
Admission: RE | Admit: 2013-06-05 | Discharge: 2013-06-05 | Disposition: A | Discharge status: Home / Self Care | Payer: BC Managed Care – PPO | Source: Ambulatory Visit | Attending: Physical Medicine and Rehabilitation | Admitting: Physical Medicine and Rehabilitation

## 2013-06-05 DIAGNOSIS — M542 Cervicalgia: Secondary | ICD-10-CM

## 2013-06-05 DIAGNOSIS — M5020 Other cervical disc displacement, unspecified cervical region: Secondary | ICD-10-CM

## 2013-06-05 DIAGNOSIS — M5412 Radiculopathy, cervical region: Secondary | ICD-10-CM

## 2013-06-05 DIAGNOSIS — M47812 Spondylosis without myelopathy or radiculopathy, cervical region: Secondary | ICD-10-CM

## 2013-06-22 ENCOUNTER — Ambulatory Visit: Payer: BC Managed Care – PPO

## 2013-06-22 ENCOUNTER — Ambulatory Visit (INDEPENDENT_AMBULATORY_CARE_PROVIDER_SITE_OTHER): Payer: BC Managed Care – PPO | Admitting: Family Medicine

## 2013-06-22 VITALS — BP 136/74 | HR 80 | Temp 97.7°F | Resp 16 | Ht 61.0 in | Wt 147.0 lb

## 2013-06-22 DIAGNOSIS — R079 Chest pain, unspecified: Secondary | ICD-10-CM

## 2013-06-22 DIAGNOSIS — Z1231 Encounter for screening mammogram for malignant neoplasm of breast: Secondary | ICD-10-CM

## 2013-06-22 DIAGNOSIS — IMO0001 Reserved for inherently not codable concepts without codable children: Secondary | ICD-10-CM

## 2013-06-22 DIAGNOSIS — Z0000 Encounter for general adult medical examination without abnormal findings: Secondary | ICD-10-CM

## 2013-06-22 DIAGNOSIS — R5381 Other malaise: Secondary | ICD-10-CM

## 2013-06-22 DIAGNOSIS — R5383 Other fatigue: Secondary | ICD-10-CM

## 2013-06-22 LAB — POCT CBC
Granulocyte percent: 58.2 % (ref 37–80)
HCT, POC: 41.3 % (ref 37.7–47.9)
Hemoglobin: 13.3 g/dL (ref 12.2–16.2)
Lymph, poc: 2.9 (ref 0.6–3.4)
MCH, POC: 27.7 pg (ref 27–31.2)
MCHC: 32.2 g/dL (ref 31.8–35.4)
MCV: 85.8 fL (ref 80–97)
MID (cbc): 0.6 (ref 0–0.9)
MPV: 9.7 fL (ref 0–99.8)
POC Granulocyte: 4.8 (ref 2–6.9)
POC LYMPH PERCENT: 35.1 % (ref 10–50)
POC MID %: 6.7 % (ref 0–12)
Platelet Count, POC: 306 K/uL (ref 142–424)
RBC: 4.81 M/uL (ref 4.04–5.48)
RDW, POC: 14.8 %
WBC: 8.3 K/uL (ref 4.6–10.2)

## 2013-06-22 LAB — COMPREHENSIVE METABOLIC PANEL
ALT: 26 U/L (ref 0–35)
AST: 22 U/L (ref 0–37)
Albumin: 4.1 g/dL (ref 3.5–5.2)
Alkaline Phosphatase: 60 U/L (ref 39–117)
BUN: 10 mg/dL (ref 6–23)
CO2: 34 mEq/L — ABNORMAL HIGH (ref 19–32)
Calcium: 9.6 mg/dL (ref 8.4–10.5)
Chloride: 101 mEq/L (ref 96–112)
Creat: 0.77 mg/dL (ref 0.50–1.10)
Glucose, Bld: 114 mg/dL — ABNORMAL HIGH (ref 70–99)
Potassium: 3.3 mEq/L — ABNORMAL LOW (ref 3.5–5.3)
Sodium: 143 mEq/L (ref 135–145)
Total Bilirubin: 0.3 mg/dL (ref 0.3–1.2)
Total Protein: 6.9 g/dL (ref 6.0–8.3)

## 2013-06-22 LAB — CK: Total CK: 87 U/L (ref 7–177)

## 2013-06-22 MED ORDER — TRAMADOL HCL 50 MG PO TABS: 50.0000 mg | ORAL_TABLET | Freq: Three times a day (TID) | ORAL | Status: DC | PRN

## 2013-06-22 NOTE — Patient Instructions (Signed)
Some of your blood tests are still pending (we sent them to an outside laboratory).  I believe the muscle soreness and chest pains may be coming from the lipitor.  The meloxicam is not helping either.  So, I want you to stop the Lipitor and Meloxicam.

## 2013-06-22 NOTE — Progress Notes (Signed)
This is a 52 year old woman from [place] who is complaining of 3 days of diffuse myalgia, chest pain which worsens with deep breath, and left breast pain. She's had no cough or shortness of breath. She denies any peripheral edema, nausea, vomiting, diarrhea. She has no new rash.  Patient states that she has been on Lipitor for 2 months.   Objective: Patient is in no acute distress, but is very meek. HEENT: No obvious abnormalities. Neck: Supple, no thyromegaly. Chest: Clear to auscultation Heart: Regular, no murmur or gallop Abdomen: Soft nontender without HSM or masses. Skin: Unremarkable-patient has henna stains on her palms and nails. Extremities: Moving extremities easily, no edema, no focal calf pain. Breast exam:  normal  [facility] reading (PRIMARY) by  Dr. Milus Glazier:  CXR negative Results for orders placed in visit on 06/22/13  POCT CBC      Result Value Range   WBC 8.3  4.6 - 10.2 K/uL   Lymph, poc 2.9  0.6 - 3.4   POC LYMPH PERCENT 35.1  10 - 50 %L   MID (cbc) 0.6  0 - 0.9   POC MID % 6.7  0 - 12 %M   POC Granulocyte 4.8  2 - 6.9   Granulocyte percent 58.2  37 - 80 %G   RBC 4.81  4.04 - 5.48 M/uL   Hemoglobin 13.3  12.2 - 16.2 g/dL   HCT, POC 04.5  40.9 - 47.9 %   MCV 85.8  80 - 97 fL   MCH, POC 27.7  27 - 31.2 pg   MCHC 32.2  31.8 - 35.4 g/dL   RDW, POC 81.1     Platelet Count, POC 306  142 - 424 K/uL   MPV 9.7  0 - 99.8 fL   Assessment:.myalgias  Chest pain - Plan: POCT CBC, CK, DG Chest 2 View, Comprehensive metabolic panel, traMADol (ULTRAM) 50 MG tablet  Myalgia and myositis - Plan: Comprehensive metabolic panel, traMADol (ULTRAM) 50 MG tablet  Other malaise and fatigue - Plan: Comprehensive metabolic panel, traMADol (ULTRAM) 50 MG tablet  Signed, Elvina Sidle, MD

## 2013-06-23 ENCOUNTER — Other Ambulatory Visit: Payer: Self-pay | Admitting: Family Medicine

## 2013-06-23 ENCOUNTER — Telehealth: Payer: Self-pay | Admitting: Radiology

## 2013-06-23 DIAGNOSIS — E876 Hypokalemia: Secondary | ICD-10-CM

## 2013-06-23 MED ORDER — POTASSIUM CHLORIDE CRYS ER 20 MEQ PO TBCR: 20.0000 meq | EXTENDED_RELEASE_TABLET | Freq: Every day | ORAL | Status: DC

## 2013-06-23 NOTE — Telephone Encounter (Signed)
Patient advised / language barrier, she did advise she understood.

## 2013-06-23 NOTE — Telephone Encounter (Signed)
I have called patient to advise her Dr Milus Glazier has sent in potassium for patient, her potassium is low and this explains the problems she has been having. Left message for her to call me back.

## 2013-07-14 ENCOUNTER — Ambulatory Visit
Admission: RE | Admit: 2013-07-14 | Discharge: 2013-07-14 | Disposition: A | Discharge status: Home / Self Care | Payer: BC Managed Care – PPO | Source: Ambulatory Visit | Attending: Family Medicine | Admitting: Family Medicine

## 2013-07-14 DIAGNOSIS — Z1231 Encounter for screening mammogram for malignant neoplasm of breast: Secondary | ICD-10-CM

## 2013-08-18 ENCOUNTER — Encounter: Payer: BC Managed Care – PPO | Admitting: Family Medicine

## 2013-09-12 ENCOUNTER — Ambulatory Visit (INDEPENDENT_AMBULATORY_CARE_PROVIDER_SITE_OTHER): Payer: BC Managed Care – PPO | Admitting: Internal Medicine

## 2013-09-12 VITALS — BP 148/72 | HR 67 | Temp 98.0°F | Resp 18 | Ht 61.0 in | Wt 145.6 lb

## 2013-09-12 DIAGNOSIS — L659 Nonscarring hair loss, unspecified: Secondary | ICD-10-CM

## 2013-09-12 DIAGNOSIS — R5381 Other malaise: Secondary | ICD-10-CM

## 2013-09-12 DIAGNOSIS — R5383 Other fatigue: Secondary | ICD-10-CM

## 2013-09-12 DIAGNOSIS — I10 Essential (primary) hypertension: Secondary | ICD-10-CM

## 2013-09-12 LAB — POCT CBC
HCT, POC: 40.3 % (ref 37.7–47.9)
Lymph, poc: 3.6 — AB (ref 0.6–3.4)
MCHC: 32 g/dL (ref 31.8–35.4)
MID (cbc): 0.4 (ref 0–0.9)
POC Granulocyte: 4.8 (ref 2–6.9)
POC LYMPH PERCENT: 40.4 %L (ref 10–50)
RDW, POC: 13.8 %

## 2013-09-12 MED ORDER — PRENATAL MULTIVITAMIN CH: 1.0000 | ORAL_TABLET | Freq: Every day | ORAL | Status: DC

## 2013-09-12 MED ORDER — AMLODIPINE BESYLATE 5 MG PO TABS: 5.0000 mg | ORAL_TABLET | Freq: Every day | ORAL | Status: DC

## 2013-09-12 NOTE — Progress Notes (Signed)
Subjective:    Patient ID: Pam Hart, female    DOB: 24-Nov-1960, 52 y.o.   MRN: [id number]  HPI HPI Comments: Pam Hart is a 52 y.o. female who presents to the [facility] complaining of fatigue with associated chest pain onset one week ago. She states she feels weak and is not able to do things at home although she does not have to do much extremel physical activity. Pt reports right knee, back, and neck pain.  Pt states her appetite is normal.  She states her hair is falling out frequently and it is no longer long and thick.  Her sister has a hx of thyroid problems. Patients thyroid test was okay in Aug 2013.  Pt denies insomnia.  Pt takes Amlodipine for her bp.  Pt is at risk for diabetes and high cholestorol and reports not being a consistent exerciser.  Pt is a mother of four ranging from 11 to 19. She states her husband is "lazy".  Pt last menstrual period was three years ago.   She brings in outside laboratories from a recent evaluation at work Everything is normal except for a mildly elevated LDL Thyroid was not tested  Review of Systems  Cardiovascular: Positive for chest pain.  Musculoskeletal: Positive for back pain and neck pain.       Objective:   Physical Exam  Nursing note and vitals reviewed. Constitutional: She is oriented to person, place, and time. She appears well-developed and well-nourished. No distress.  HENT:  Head: Normocephalic and atraumatic.  Eyes: Conjunctivae and EOM are normal. Pupils are equal, round, and reactive to light.  Neck: Normal range of motion. Neck supple. No thyromegaly present.  Cardiovascular: Normal rate, regular rhythm, normal heart sounds and intact distal pulses.   No murmur heard. Pulmonary/Chest: Effort normal and breath sounds normal. No respiratory distress.  Musculoskeletal: Normal range of motion. She exhibits no edema.  Lymphadenopathy:    She has no cervical adenopathy.  Neurological: She  is alert and oriented to person, place, and time. She has normal reflexes. No cranial nerve deficit.  Skin: Skin is warm and dry.  Psychiatric: She has a normal mood and affect. Her behavior is normal.          Assessment & Plan:  Hypertension - Plan: amLODipine (NORVASC) 5 MG tablet, POCT CBC, TSH, T4, free, Comprehensive metabolic panel  Hair loss - Plan: POCT CBC, TSH, T4, free, Comprehensive metabolic panel  Fatigue - Plan: POCT CBC, TSH, T4, free, Comprehensive metabolic panel  Mild hyperlipidemia  Meds ordered this encounter  Medications  . Prenatal Vit-Fe Fumarate-FA (PRENATAL MULTIVITAMIN) TABS tablet    Sig: Take 1 tablet by mouth daily at 12 noon.    Dispense:  30 tablet    Refill:  11  . amLODipine (NORVASC) 5 MG tablet    Sig: Take 1 tablet (5 mg total) by mouth daily.    Dispense:  90 tablet    Refill:  3    Results for orders placed in visit on 09/12/13  TSH      Result Value Range   TSH 1.639  0.350 - 4.500 uIU/mL  T4, FREE      Result Value Range   Free T4 1.19  0.80 - 1.80 ng/dL  COMPREHENSIVE METABOLIC PANEL      Result Value Range   Sodium 141  135 - 145 mEq/L   Potassium 3.8  3.5 - 5.3 mEq/L   Chloride 104  96 - 112 mEq/L   CO2 28  19 - 32 mEq/L   Glucose, Bld 97  70 - 99 mg/dL   BUN 15  6 - 23 mg/dL   Creat 4.09  8.11 - 9.14 mg/dL   Total Bilirubin 0.3  0.3 - 1.2 mg/dL   Alkaline Phosphatase 56  39 - 117 U/L   AST 19  0 - 37 U/L   ALT 17  0 - 35 U/L   Total Protein 7.0  6.0 - 8.3 g/dL   Albumin 4.2  3.5 - 5.2 g/dL   Calcium 9.2  8.4 - 78.2 mg/dL  POCT CBC      Result Value Range   WBC 8.8  4.6 - 10.2 K/uL   Lymph, poc 3.6 (*) 0.6 - 3.4   POC LYMPH PERCENT 40.4  10 - 50 %L   MID (cbc) 0.4  0 - 0.9   POC MID % 5.0  0 - 12 %M   POC Granulocyte 4.8  2 - 6.9   Granulocyte percent 54.6  37 - 80 %G   RBC 4.72  4.04 - 5.48 M/uL   Hemoglobin 12.9  12.2 - 16.2 g/dL   HCT, POC 95.6  21.3 - 47.9 %   MCV 85.3  80 - 97 fL   MCH, POC 27.3  27 -  31.2 pg   MCHC 32.0  31.8 - 35.4 g/dL   RDW, POC 08.6     Platelet Count, POC 251  142 - 424 K/uL   MPV 10.8  0 - 99.8 fL    I have completed the patient encounter in its entirety as documented by the scribe, with editing by me where necessary. Cecylia Brazill P. Merla Riches, M.D.

## 2013-09-13 LAB — TSH: TSH: 1.639 u[IU]/mL (ref 0.350–4.500)

## 2013-09-13 LAB — COMPREHENSIVE METABOLIC PANEL
AST: 19 U/L (ref 0–37)
BUN: 15 mg/dL (ref 6–23)
CO2: 28 mEq/L (ref 19–32)
Calcium: 9.2 mg/dL (ref 8.4–10.5)
Chloride: 104 mEq/L (ref 96–112)
Creat: 0.68 mg/dL (ref 0.50–1.10)

## 2013-09-14 ENCOUNTER — Ambulatory Visit: Payer: BC Managed Care – PPO | Admitting: Family Medicine

## 2013-09-17 ENCOUNTER — Encounter: Payer: Self-pay | Admitting: Internal Medicine

## 2013-10-06 ENCOUNTER — Ambulatory Visit (INDEPENDENT_AMBULATORY_CARE_PROVIDER_SITE_OTHER): Payer: BC Managed Care – PPO | Admitting: Family Medicine

## 2013-10-06 ENCOUNTER — Ambulatory Visit: Payer: BC Managed Care – PPO

## 2013-10-06 VITALS — BP 130/80 | HR 61 | Temp 98.8°F | Resp 16 | Ht 61.75 in | Wt 143.0 lb

## 2013-10-06 DIAGNOSIS — R079 Chest pain, unspecified: Secondary | ICD-10-CM

## 2013-10-06 DIAGNOSIS — R5381 Other malaise: Secondary | ICD-10-CM

## 2013-10-06 DIAGNOSIS — IMO0001 Reserved for inherently not codable concepts without codable children: Secondary | ICD-10-CM

## 2013-10-06 DIAGNOSIS — M542 Cervicalgia: Secondary | ICD-10-CM

## 2013-10-06 DIAGNOSIS — T148XXA Other injury of unspecified body region, initial encounter: Secondary | ICD-10-CM

## 2013-10-06 DIAGNOSIS — B001 Herpesviral vesicular dermatitis: Secondary | ICD-10-CM

## 2013-10-06 DIAGNOSIS — B009 Herpesviral infection, unspecified: Secondary | ICD-10-CM

## 2013-10-06 MED ORDER — CYCLOBENZAPRINE HCL 5 MG PO TABS: 5.0000 mg | ORAL_TABLET | Freq: Every evening | ORAL | Status: DC | PRN

## 2013-10-06 MED ORDER — MELOXICAM 15 MG PO TABS: 15.0000 mg | ORAL_TABLET | Freq: Every day | ORAL | Status: DC

## 2013-10-06 MED ORDER — TRAMADOL HCL 50 MG PO TABS: 50.0000 mg | ORAL_TABLET | Freq: Three times a day (TID) | ORAL | Status: DC | PRN

## 2013-10-06 MED ORDER — ACYCLOVIR 5 % EX OINT: 1.0000 "application " | TOPICAL_OINTMENT | Freq: Four times a day (QID) | CUTANEOUS | Status: DC | PRN
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
## 2014-06-13 DIAGNOSIS — Z0271 Encounter for disability determination: Secondary | ICD-10-CM

## 2014-08-18 ENCOUNTER — Ambulatory Visit (INDEPENDENT_AMBULATORY_CARE_PROVIDER_SITE_OTHER): Payer: BC Managed Care – PPO | Admitting: Physician Assistant

## 2014-08-18 VITALS — BP 148/70 | HR 66 | Temp 97.8°F | Resp 16 | Ht 61.0 in | Wt 143.8 lb

## 2014-08-18 DIAGNOSIS — K219 Gastro-esophageal reflux disease without esophagitis: Secondary | ICD-10-CM

## 2014-08-18 DIAGNOSIS — Z1159 Encounter for screening for other viral diseases: Secondary | ICD-10-CM

## 2014-08-18 DIAGNOSIS — I10 Essential (primary) hypertension: Secondary | ICD-10-CM

## 2014-08-18 DIAGNOSIS — Z1239 Encounter for other screening for malignant neoplasm of breast: Secondary | ICD-10-CM

## 2014-08-18 DIAGNOSIS — R109 Unspecified abdominal pain: Secondary | ICD-10-CM

## 2014-08-18 DIAGNOSIS — T148 Other injury of unspecified body region: Secondary | ICD-10-CM

## 2014-08-18 DIAGNOSIS — Z13220 Encounter for screening for lipoid disorders: Secondary | ICD-10-CM

## 2014-08-18 DIAGNOSIS — Z124 Encounter for screening for malignant neoplasm of cervix: Secondary | ICD-10-CM

## 2014-08-18 DIAGNOSIS — Z0000 Encounter for general adult medical examination without abnormal findings: Secondary | ICD-10-CM

## 2014-08-18 DIAGNOSIS — R300 Dysuria: Secondary | ICD-10-CM

## 2014-08-18 DIAGNOSIS — T148XXA Other injury of unspecified body region, initial encounter: Secondary | ICD-10-CM

## 2014-08-18 DIAGNOSIS — Z1211 Encounter for screening for malignant neoplasm of colon: Secondary | ICD-10-CM

## 2014-08-18 LAB — LIPID PANEL
CHOLESTEROL: 216 mg/dL — AB (ref 0–200)
HDL: 57 mg/dL (ref >39)
LDL Cholesterol: 117 mg/dL — ABNORMAL HIGH (ref 0–99)
Total CHOL/HDL Ratio: 3.8 Ratio
Triglycerides: 212 mg/dL — ABNORMAL HIGH (ref <150)
VLDL: 42 mg/dL — ABNORMAL HIGH (ref 0–40)

## 2014-08-18 LAB — COMPREHENSIVE METABOLIC PANEL
ALBUMIN: 4.6 g/dL (ref 3.5–5.2)
ALT: 18 U/L (ref 0–35)
AST: 18 U/L (ref 0–37)
Alkaline Phosphatase: 73 U/L (ref 39–117)
BUN: 14 mg/dL (ref 6–23)
CALCIUM: 9.5 mg/dL (ref 8.4–10.5)
CHLORIDE: 102 meq/L (ref 96–112)
CO2: 29 meq/L (ref 19–32)
Creat: 0.62 mg/dL (ref 0.50–1.10)
Glucose, Bld: 94 mg/dL (ref 70–99)
POTASSIUM: 4.1 meq/L (ref 3.5–5.3)
Sodium: 141 mEq/L (ref 135–145)
Total Bilirubin: 0.4 mg/dL (ref 0.2–1.2)
Total Protein: 7.2 g/dL (ref 6.0–8.3)

## 2014-08-18 LAB — POCT CBC
GRANULOCYTE PERCENT: 59.5 % (ref 37–80)
HEMATOCRIT: 43.6 % (ref 37.7–47.9)
HEMOGLOBIN: 14.3 g/dL (ref 12.2–16.2)
Lymph, poc: 2.5 (ref 0.6–3.4)
MCH: 26.6 pg — AB (ref 27–31.2)
MCHC: 32.8 g/dL (ref 31.8–35.4)
MCV: 81.2 fL (ref 80–97)
MID (CBC): 0.3 (ref 0–0.9)
MPV: 8.8 fL (ref 0–99.8)
PLATELET COUNT, POC: 267 10*3/uL (ref 142–424)
POC Granulocyte: 4 (ref 2–6.9)
POC LYMPH PERCENT: 36.1 %L (ref 10–50)
POC MID %: 4.4 %M (ref 0–12)
RBC: 5.37 M/uL (ref 4.04–5.48)
RDW, POC: 14.1 %
WBC: 6.8 10*3/uL (ref 4.6–10.2)

## 2014-08-18 LAB — POCT URINALYSIS DIPSTICK
Bilirubin, UA: NEGATIVE
Blood, UA: NEGATIVE
Glucose, UA: NEGATIVE
Ketones, UA: NEGATIVE
LEUKOCYTES UA: NEGATIVE
NITRITE UA: NEGATIVE
PH UA: 5
PROTEIN UA: NEGATIVE
Spec Grav, UA: 1.01
UROBILINOGEN UA: 0.2

## 2014-08-18 LAB — POCT UA - MICROSCOPIC ONLY
Bacteria, U Microscopic: NEGATIVE
CASTS, UR, LPF, POC: NEGATIVE
CRYSTALS, UR, HPF, POC: NEGATIVE
Mucus, UA: NEGATIVE
RBC, URINE, MICROSCOPIC: NEGATIVE
YEAST UA: NEGATIVE

## 2014-08-18 LAB — TSH: TSH: 2.084 u[IU]/mL (ref 0.350–4.500)

## 2014-08-18 MED ORDER — AMLODIPINE BESYLATE 5 MG PO TABS: 5.0000 mg | ORAL_TABLET | Freq: Every day | ORAL | Status: DC

## 2014-08-18 MED ORDER — MELOXICAM 15 MG PO TABS: 15.0000 mg | ORAL_TABLET | Freq: Every day | ORAL | Status: DC

## 2014-08-18 MED ORDER — CYCLOBENZAPRINE HCL 5 MG PO TABS: 5.0000 mg | ORAL_TABLET | Freq: Every evening | ORAL | Status: DC | PRN

## 2014-08-18 MED ORDER — RANITIDINE HCL 150 MG PO TABS: 150.0000 mg | ORAL_TABLET | Freq: Two times a day (BID) | ORAL | Status: DC

## 2014-08-18 MED ORDER — CHLORTHALIDONE 25 MG PO TABS: 25.0000 mg | ORAL_TABLET | Freq: Every day | ORAL | Status: DC

## 2014-08-18 NOTE — Patient Instructions (Signed)
[id number]   Keeping You Healthy  Get These Tests  Blood Pressure- Have your blood pressure checked by your healthcare provider at least once a year.  Normal blood pressure is 120/80.  Weight- Have your body mass index (BMI) calculated to screen for obesity.  BMI is a measure of body fat based on height and weight.  You can calculate your own BMI at https://www.west-esparza.com/www.nhlbisupport.com/bmi/  Cholesterol- Have your cholesterol checked every year.  Diabetes- Have your blood sugar checked every year if you have high blood pressure, high cholesterol, a family history of diabetes or if you are overweight.  Pap Smear- Have a pap smear every 1 to 5 years if you have been sexually active.  If you are older than 65 and recent pap smears have been normal you may not need additional pap smears.  In addition, if you have had a hysterectomy  For benign disease additional pap smears are not necessary.  Mammogram-Yearly mammograms are essential for early detection of breast cancer  Screening for Colon Cancer- Colonoscopy starting at age 54. Screening may begin sooner depending on your family history and other health conditions.  Follow up colonoscopy as directed by your Gastroenterologist.  Screening for Osteoporosis- Screening begins at age 865 with bone density scanning, sooner if you are at higher risk for developing Osteoporosis.  Get these medicines  Calcium with Vitamin D- Your body requires 1200-1500 mg of Calcium a day and [phone] IU of Vitamin D a day.  You can only absorb 500 mg of Calcium at a time therefore Calcium must be taken in 2 or 3 separate doses throughout the day.  Hormones- Hormone therapy has been associated with increased risk for certain cancers and heart disease.  Talk to your healthcare provider about if you need relief from menopausal symptoms.  Aspirin- Ask your healthcare provider about taking Aspirin to prevent Heart Disease and Stroke.  Get these Immuniztions  Flu shot- Every  fall  Pneumonia shot- Once after the age of 54; if you are younger ask your healthcare provider if you need a pneumonia shot.  Tetanus- Every ten years.  Zostavax- Once after the age of 54 to prevent shingles.  Take these steps  Don't smoke- Your healthcare provider can help you quit. For tips on how to quit, ask your healthcare provider or go to www.smokefree.gov or call 1-800 QUIT-NOW.  Be physically active- Exercise 5 days a week for a minimum of 30 minutes.  If you are not already physically active, start slow and gradually work up to 30 minutes of moderate physical activity.  Try walking, dancing, bike riding, swimming, etc.  Eat a healthy diet- Eat a variety of healthy foods such as fruits, vegetables, whole grains, low fat milk, low fat cheeses, yogurt, lean meats, chicken, fish, eggs, dried beans, tofu, etc.  For more information go to www.thenutritionsource.org  Dental visit- Brush and floss teeth twice daily; visit your dentist twice a year.  Eye exam- Visit your Optometrist or Ophthalmologist yearly.  Drink alcohol in moderation- Limit alcohol intake to one drink or less a day.  Never drink and drive.  Depression- Your emotional health is as important as your physical health.  If you're feeling down or losing interest in things you normally enjoy, please talk to your healthcare provider.  Seat Belts- can save your life; always wear one  Smoke/Carbon Monoxide detectors- These detectors need to be installed on the appropriate level of your home.  Replace batteries at least once a year.  Violence- If anyone is threatening or hurting you, please tell your healthcare provider.  Living Will/ Health care power of attorney- Discuss with your healthcare provider and family.

## 2014-08-18 NOTE — Progress Notes (Signed)
Subjective:    Patient ID: Pam Hart, female    DOB: 1961/11/01, 53 y.o.   MRN: [id number]  Chief Complaint  Patient presents with  . Annual Exam  . Back Pain    x 3 days   . Dizziness  . Groin Pain    left side     Back Pain Associated symptoms include abdominal pain (right groin) and dysuria. Pertinent negatives include no fever.  Dizziness Associated symptoms include abdominal pain (right groin), chills, fatigue, myalgias and nausea. Pertinent negatives include no fever or vomiting.  Groin Pain Associated symptoms include abdominal pain (right groin), back pain, chills, dysuria, flank pain, frequency and nausea. Pertinent negatives include no fever, hematuria or vomiting.    This is a 53 year old woman here for a complete physical exam. She is complaining of left flank pain and left groin pain x 2 days. She is also endorsing dysuria and urinary frequency. She admits she is bad about drinking water and mostly drinks juice. Yesterday she drank more water and felt a little better. She has felt fatigued, nauseated and has had chills. She denies hematuria, fever, vomiting or diarrhea.  She reports she frequently has GERD symptoms. Zantac has worked for her in the past. She would like a new prescription for this.   Today her BP is 148/70. However she reports when she takes her BP at home it runs 160-180/90-100. She states she takes her medications daily.   She walks for exercise daily however over the past 2 weeks she states she has "felt lazy". She eats a well-balanced diet.  Health maintenance: -last mammogram in 06/2013 and negative. Will get next mammogram in 2016. -Pelvic and pap - due and received today. -up to date on tetanus -refused influenza vaccine -refused colonoscopy - she will do at home hemossure   Review of Systems  Constitutional: Positive for chills and fatigue. Negative for fever.  HENT: Negative.   Eyes: Negative.   Respiratory: Negative.     Cardiovascular: Negative.   Gastrointestinal: Positive for nausea and abdominal pain (right groin). Negative for vomiting.  Endocrine: Negative.   Genitourinary: Positive for dysuria, frequency and flank pain. Negative for hematuria.  Musculoskeletal: Positive for back pain and myalgias.  Skin: Negative.   Neurological: Negative.   Hematological: Negative.   Psychiatric/Behavioral: Negative.        Objective:   Physical Exam  Constitutional: She appears well-developed and well-nourished. No distress.  HENT:  Head: Normocephalic and atraumatic.  Right Ear: External ear normal.  Left Ear: External ear normal.  Mouth/Throat: No oropharyngeal exudate.  Oropharynx slightly erythematous. Bilateral tympanic membranes clear  Eyes: Conjunctivae and EOM are normal. Pupils are equal, round, and reactive to light. Right eye exhibits no discharge. Left eye exhibits no discharge.  Neck: Neck supple. No thyromegaly present.  Cardiovascular: Normal rate, regular rhythm, normal heart sounds and intact distal pulses.   No murmur heard. Pulmonary/Chest: Effort normal and breath sounds normal. No respiratory distress. She has no wheezes. She has no rales.  Abdominal: Soft. Bowel sounds are normal. She exhibits no mass. There is tenderness (LLQ). There is no rebound and no guarding.  Genitourinary: Vagina normal and uterus normal. No vaginal discharge found.  No cervical motion tenderness  Lymphadenopathy:    She has no cervical adenopathy.  Skin: She is not diaphoretic.   Results for orders placed in visit on 08/18/14  POCT UA - MICROSCOPIC ONLY      Result Value Ref Range  WBC, Ur, HPF, POC 0-1     RBC, urine, microscopic negative     Bacteria, U Microscopic negative     Mucus, UA negative     Epithelial cells, urine per micros 1-3     Crystals, Ur, HPF, POC negative     Casts, Ur, LPF, POC negative     Yeast, UA negative    POCT URINALYSIS DIPSTICK      Result Value Ref Range   Color,  UA yellow     Clarity, UA clear     Glucose, UA negative     Bilirubin, UA negative     Ketones, UA negative     Spec Grav, UA 1.010     Blood, UA negative     pH, UA 5.0     Protein, UA negative     Urobilinogen, UA 0.2     Nitrite, UA negative     Leukocytes, UA Negative    POCT CBC      Result Value Ref Range   WBC 6.8  4.6 - 10.2 K/uL   Lymph, poc 2.5  0.6 - 3.4   POC LYMPH PERCENT 36.1  10 - 50 %L   MID (cbc) 0.3  0 - 0.9   POC MID % 4.4  0 - 12 %M   POC Granulocyte 4.0  2 - 6.9   Granulocyte percent 59.5  37 - 80 %G   RBC 5.37  4.04 - 5.48 M/uL   Hemoglobin 14.3  12.2 - 16.2 g/dL   HCT, POC 43.6  37.7 - 47.9 %   MCV 81.2  80 - 97 fL   MCH, POC 26.6 (*) 27 - 31.2 pg   MCHC 32.8  31.8 - 35.4 g/dL   RDW, POC 14.1     Platelet Count, POC 267  142 - 424 K/uL   MPV 8.8  0 - 99.8 fL        Assessment & Plan:  1. Annual physical exam 2. Screening for breast cancer 3. Screening for cervical cancer 4. Screening for colon cancer 5. Screening for hyperlipidemia 6. Dysuria 7. Flank pain 8. Need for Hep C screening 9. Essential Hypertension 10. Sprain and strain   53 year old female with PMH hypertension and back pain here today for annual physical exam. Her BP was slightly elevated to 148/70. She reports at home her readings are even higher to 160-180/90-100. We discussed the need for salt restriction, taking meds daily, and continued at home monitoring. If blood pressure continues to be elevated she should call the office for adjustment in her medications. Hypertension medications were refilled.  Regarding her complaints of dysuria and left flank pain, her POCT UA and CBC are both normal, see above. We discussed continued hydration and she opted to take ibuprofen 400-600 mg OTC every 6 hours as needed for pain.   Zantac prescribed for GERD symptoms. This has worked well for her in the past.  She takes flexeril and meloxicam for back pain. These were refilled.  Health  maintenance updates: pap smear today, patient opted to take home hemossure kit and return to office.  Labs below pending:  - Pap IG and HPV (high risk) DNA detection -Hemossure take home kit - Lipid panel - Comprehensive metabolic panel - TSH - Hepatitis C antibody  - amLODipine (NORVASC) 5 MG tablet; Take 1 tablet (5 mg total) by mouth daily.  Dispense: 90 tablet; Refill: 3 - chlorthalidone (HYGROTON) 25 MG tablet; Take 1 tablet (  25 mg total) by mouth daily.  Dispense: 90 tablet; Refill: 1 - cyclobenzaprine (FLEXERIL) 5 MG tablet; Take 1 tablet (5 mg total) by mouth at bedtime as needed.  Dispense: 30 tablet; Refill: 0 - meloxicam (MOBIC) 15 MG tablet; Take 1 tablet (15 mg total) by mouth daily. Take with food, no other NSAIDs  Dispense: 30 tablet; Refill: 0 -Zantac 150 mg BID prn for GERD symptoms

## 2014-08-19 LAB — HEPATITIS C ANTIBODY: HCV AB: NEGATIVE

## 2014-08-19 NOTE — Progress Notes (Signed)
I have examined this patient along with Ms. Bush, PA-C and agree.  

## 2014-08-22 LAB — PAP IG AND HPV HIGH-RISK: HPV DNA High Risk: NOT DETECTED

## 2014-08-23 ENCOUNTER — Encounter: Payer: Self-pay | Admitting: Physician Assistant

## 2014-09-28 ENCOUNTER — Ambulatory Visit (INDEPENDENT_AMBULATORY_CARE_PROVIDER_SITE_OTHER): Payer: BC Managed Care – PPO | Admitting: Emergency Medicine

## 2014-09-28 VITALS — BP 148/86 | HR 58 | Temp 98.9°F | Resp 16 | Ht 60.5 in | Wt 144.4 lb

## 2014-09-28 DIAGNOSIS — I889 Nonspecific lymphadenitis, unspecified: Secondary | ICD-10-CM

## 2014-09-28 MED ORDER — AMOXICILLIN-POT CLAVULANATE 875-125 MG PO TABS: 1.0000 | ORAL_TABLET | Freq: Two times a day (BID) | ORAL | Status: DC

## 2014-09-28 NOTE — Patient Instructions (Signed)
Cervical Adenitis °You have a swollen lymph gland in your neck. This commonly happens with Strep and virus infections, dental problems, insect bites, and injuries about the face, scalp, or neck. The lymph glands swell as the body fights the infection or heals the injury. Swelling and firmness typically lasts for several weeks after the infection or injury is healed. Rarely lymph glands can become swollen because of cancer or TB. °Antibiotics are prescribed if there is evidence of an infection. Sometimes an infected lymph gland becomes filled with pus. This condition may require opening up the abscessed gland by draining it surgically. Most of the time infected glands return to normal within two weeks. Do not poke or squeeze the swollen lymph nodes. That may keep them from shrinking back to their normal size. If the lymph gland is still swollen after 2 weeks, further medical evaluation is needed.  °SEEK IMMEDIATE MEDICAL CARE IF:  °You have difficulty swallowing or breathing, increased swelling, severe pain, or a high fever.  °Document Released: 11/03/2005 Document Revised: 01/26/2012 Document Reviewed: 04/25/2007 °ExitCare® Patient Information ©2015 [employer]. This information is not intended to replace advice given to you by your health care provider. Make sure you discuss any questions you have with your health care provider. ° °

## 2014-09-28 NOTE — Progress Notes (Signed)
[facility] [address], [place] [address] [phone]  Date:  09/28/2014   Name:  Pam Hart   DOB:  06-03-1961   MRN:  [id number]  PCP:  No primary care provider on file.    Chief Complaint: Sore Throat and Abscess   History of Present Illness:  Pam Hart is a 53 y.o. very pleasant female patient who presents with the following:  Patient has sore throat and a foul taste in her mouth.  Thinks she has a bad odor in her mouth and nausea but no vomiting. Has chills but no fever. Frequent sore throats Thinks she has an abscess in her throat. No improvement with over the counter medications or other home remedies.  Denies other complaint or health concern today.   Patient Active Problem List   Diagnosis Date Noted  . Hyperlipidemia 03/17/2013  . HYPERTENSION, ESSENTIAL NOS 08/05/2007  . FATIGUE 08/05/2007    Past Medical History  Diagnosis Date  . Arthritis   . Anemia   . Thyroid disease   . Hypertension     Past Surgical History  Procedure Laterality Date  . Appendectomy    . Eye surgery Right 2011    lasix     History  Substance Use Topics  . Smoking status: Never Smoker   . Smokeless tobacco: Never Used  . Alcohol Use: No    Family History  Problem Relation Age of Onset  . Arthritis Maternal Aunt   . Hypertension Mother   . Heart disease Mother   . Alzheimer's disease Father   . Glaucoma Father     No Known Allergies  Medication list has been reviewed and updated.  Current Outpatient Prescriptions on File Prior to Visit  Medication Sig Dispense Refill  . amLODipine (NORVASC) 5 MG tablet Take 1 tablet (5 mg total) by mouth daily. 90 tablet 3  . chlorthalidone (HYGROTON) 25 MG tablet Take 1 tablet (25 mg total) by mouth daily. 90 tablet 1  . cyclobenzaprine (FLEXERIL) 5 MG tablet Take 1 tablet (5 mg total) by mouth at bedtime as needed. 30 tablet 0  . meloxicam (MOBIC) 15 MG tablet Take 1 tablet (15 mg total)  by mouth daily. Take with food, no other NSAIDs 30 tablet 0  . Prenatal Vit-Fe Fumarate-FA (PRENATAL MULTIVITAMIN) TABS tablet Take 1 tablet by mouth daily at 12 noon. 30 tablet 11  . ranitidine (ZANTAC) 150 MG tablet Take 1 tablet (150 mg total) by mouth 2 (two) times daily. 60 tablet 0  . potassium chloride SA (K-DUR,KLOR-CON) 20 MEQ tablet Take 1 tablet (20 mEq total) by mouth daily. 30 tablet 3  . traMADol (ULTRAM) 50 MG tablet Take 1 tablet (50 mg total) by mouth every 8 (eight) hours as needed. 30 tablet 0   No current facility-administered medications on file prior to visit.    Review of Systems:  As per HPI, otherwise negative.    Physical Examination: Filed Vitals:   09/28/14 1552  BP: 148/86  Pulse: 58  Temp: 98.9 F (37.2 C)  Resp: 16   Filed Vitals:   09/28/14 1552  Height: 5' 0.5" (1.537 m)  Weight: 144 lb 6.4 oz (65.499 kg)   Body mass index is 27.73 kg/(m^2). Ideal Body Weight: Weight in (lb) to have BMI = 25: 129.9  GEN: WDWN, NAD, Non-toxic, A & O x 3 HEENT: Atraumatic, Normocephalic. Neck supple. No masses, No LAD. Ears and Nose: No external deformity. CV: RRR,  No M/G/R. No JVD. No thrill. No extra heart sounds. PULM: CTA B, no wheezes, crackles, rhonchi. No retractions. No resp. distress. No accessory muscle use. ABD: S, NT, ND, +BS. No rebound. No HSM. EXTR: No c/c/e NEURO Normal gait.  PSYCH: Normally interactive. Conversant. Not depressed or anxious appearing.  Calm demeanor.  Lymphadenopathy right submandibular node   Assessment and Plan: Lymphadenitis augmentin  No evidence for abscess ENT   Signed,  Phillips OdorJeffery Adryan Druckenmiller, MD

## 2015-01-24 ENCOUNTER — Ambulatory Visit (INDEPENDENT_AMBULATORY_CARE_PROVIDER_SITE_OTHER): Payer: BLUE CROSS/BLUE SHIELD | Admitting: Physician Assistant

## 2015-01-24 VITALS — BP 138/82 | HR 78 | Temp 97.5°F | Resp 18 | Ht 61.0 in | Wt 148.4 lb

## 2015-01-24 DIAGNOSIS — Z23 Encounter for immunization: Secondary | ICD-10-CM

## 2015-01-24 DIAGNOSIS — T148 Other injury of unspecified body region: Secondary | ICD-10-CM | POA: Diagnosis not present

## 2015-01-24 DIAGNOSIS — S61211A Laceration without foreign body of left index finger without damage to nail, initial encounter: Secondary | ICD-10-CM | POA: Diagnosis not present

## 2015-01-24 DIAGNOSIS — IMO0002 Reserved for concepts with insufficient information to code with codable children: Secondary | ICD-10-CM

## 2015-01-24 MED ORDER — TRAMADOL HCL 50 MG PO TABS: 50.0000 mg | ORAL_TABLET | Freq: Three times a day (TID) | ORAL | Status: DC | PRN

## 2015-01-24 NOTE — Progress Notes (Signed)
Subjective:    Patient ID: Pam Hart, female    DOB: 1961/01/18, 54 y.o.   MRN: [id number]  HPI  This is a 54 year old female with PMH HTN and HLD who is presenting with a laceration to her left index finger. Injury occurred when she was adjusting a cushion on a chair. She then sat down on the cushion - her finger was still under the cushion. The finger sliced through a sharp piece on the leg of the chair. She is having some numbness in the tip of her finger. She denies weakness or decreased movement. Last tdap in 2007.   Review of Systems  Constitutional: Negative for fever and chills.  Gastrointestinal: Negative for nausea and vomiting.  Skin: Positive for wound.  Neurological: Positive for numbness. Negative for weakness.    Patient Active Problem List   Diagnosis Date Noted  . Hyperlipidemia 03/17/2013  . HYPERTENSION, ESSENTIAL NOS 08/05/2007  . FATIGUE 08/05/2007   Prior to Admission medications   Medication Sig Start Date End Date Taking? Authorizing Provider  amLODipine (NORVASC) 5 MG tablet Take 1 tablet (5 mg total) by mouth daily. 08/18/14  Yes Lanier ClamNicole Terilyn Sano V, PA-C  meloxicam (MOBIC) 15 MG tablet Take 1 tablet (15 mg total) by mouth daily. Take with food, no other NSAIDs 08/18/14  Yes Lanier ClamNicole Rayan Dyal V, PA-C  ranitidine (ZANTAC) 150 MG tablet Take 1 tablet (150 mg total) by mouth 2 (two) times daily. 08/18/14  Yes Lanier ClamNicole Kayler Rise V, PA-C                               No Known Allergies  Patient's social and family history were reviewed.     Objective:   Physical Exam  Constitutional: She is oriented to person, place, and time. She appears well-developed and well-nourished. No distress.  HENT:  Head: Normocephalic and atraumatic.  Right Ear: Hearing normal.  Left Ear: Hearing normal.  Nose: Nose normal.  Eyes: Conjunctivae and lids are normal. Right eye exhibits no discharge. Left eye exhibits no discharge. No scleral icterus.  Cardiovascular: Normal rate, regular  rhythm, intact distal pulses and normal pulses.   Pulmonary/Chest: Effort normal and breath sounds normal. No respiratory distress.  Musculoskeletal: Normal range of motion.       Left hand: She exhibits tenderness (distal 2nd digit). She exhibits normal range of motion, no bony tenderness and normal capillary refill. Normal sensation noted. Normal strength noted.  Neurological: She is alert and oriented to person, place, and time. She has normal strength. No sensory deficit.  Skin: Skin is warm and dry.  Left 2nd digit with partially missing nail and distal skin flap. Laceration 2 cm in length. No tendon involvement.  Psychiatric: She has a normal mood and affect. Her speech is normal and behavior is normal. Thought content normal.   BP 138/82 mmHg  Pulse 78  Temp(Src) 97.5 F (36.4 C) (Oral)  Resp 18  Ht 5\' 1"  (1.549 m)  Wt 148 lb 6.4 oz (67.314 kg)  BMI 28.05 kg/m2  SpO2 98%  Procedure: Verbal consent obtained. Skin was anesthetized with 4 cc 1% lido and cleaned with soap and water. A 2 cm laceration located was sutured with #5 simple 5.0 ethilon sutures. Wound was dressed and wound care discussed.     Assessment & Plan:  1. Laceration #5 sutures placed. Tdap given. Tramadol for pain, per pt request - has worked well for  her in the past. Wound care discussed. She will return in 7 days for suture removal.  - Tdap vaccine greater than or equal to 7yo IM - traMADol (ULTRAM) 50 MG tablet; Take 1 tablet (50 mg total) by mouth every 8 (eight) hours as needed.  Dispense: 20 tablet; Refill: 0   [place] Miners. Dyke Brackett, MHS [facility]  01/24/2015

## 2015-01-24 NOTE — Patient Instructions (Addendum)
Change dressings daily as long as wound is draining. Once not draining anymore, may leave wound open to the air. Do not get wet for 24 hours - after 24 hours may get wet with soap and water but do not scrub. If wound gets infected - skin around gets warm, red, or painful or if starts draining pus or if you develop fever or chills - return to be seen. Return in 7 days for suture removal.    Tdap Vaccine (Tetanus, Diphtheria, Pertussis): What You Need to Know 1. Why get vaccinated? Tetanus, diphtheria and pertussis can be very serious diseases, even for adolescents and adults. Tdap vaccine can protect [place] from these diseases. TETANUS (Lockjaw) causes painful muscle tightening and stiffness, usually all over the body.  It can lead to tightening of muscles in the head and neck so you can't open your mouth, swallow, or sometimes even breathe. Tetanus kills about 1 out of 5 people who are infected. DIPHTHERIA can cause a thick coating to form in the back of the throat.  It can lead to breathing problems, paralysis, heart failure, and death. PERTUSSIS (Whooping Cough) causes severe coughing spells, which can cause difficulty breathing, vomiting and disturbed sleep.  It can also lead to weight loss, incontinence, and rib fractures. Up to 2 in 100 adolescents and 5 in 100 adults with pertussis are hospitalized or have complications, which could include pneumonia or death. These diseases are caused by bacteria. Diphtheria and pertussis are spread from person to person through coughing or sneezing. Tetanus enters the body through cuts, scratches, or wounds. Before vaccines, the [place] saw as many as 200,000 cases a year of diphtheria and pertussis, and hundreds of cases of tetanus. Since vaccination began, tetanus and diphtheria have dropped by about 99% and pertussis by about 80%. 2. Tdap vaccine Tdap vaccine can protect adolescents and adults from tetanus, diphtheria, and pertussis. One dose of  Tdap is routinely given at age 49 or 36. People who did not get Tdap at that age should get it as soon as possible. Tdap is especially important for health care professionals and anyone having close contact with a baby younger than 12 months. Pregnant women should get a dose of Tdap during every pregnancy, to protect the newborn from pertussis. Infants are most at risk for severe, life-threatening complications from pertussis. A similar vaccine, called Td, protects from tetanus and diphtheria, but not pertussis. A Td booster should be given every 10 years. Tdap may be given as one of these boosters if you have not already gotten a dose. Tdap may also be given after a severe cut or burn to prevent tetanus infection. Your doctor can give you more information. Tdap may safely be given at the same time as other vaccines. 3. Some people should not get this vaccine  If you ever had a life-threatening allergic reaction after a dose of any tetanus, diphtheria, or pertussis containing vaccine, OR if you have a severe allergy to any part of this vaccine, you should not get Tdap. Tell your doctor if you have any severe allergies.  If you had a coma, or long or multiple seizures within 7 days after a childhood dose of DTP or DTaP, you should not get Tdap, unless a cause other than the vaccine was found. You can still get Td.  Talk to your doctor if you:  have epilepsy or another nervous system problem,  had severe pain or swelling after any vaccine containing diphtheria, tetanus or pertussis,  ever had Guillain-Barr Syndrome (GBS),  aren't feeling well on the day the shot is scheduled. 4. Risks of a vaccine reaction With any medicine, including vaccines, there is a chance of side effects. These are usually mild and go away on their own, but serious reactions are also possible. Brief fainting spells can follow a vaccination, leading to injuries from falling. Sitting or lying down for about 15 minutes can  help prevent these. Tell your doctor if you feel dizzy or light-headed, or have vision changes or ringing in the ears. Mild problems following Tdap (Did not interfere with activities)  Pain where the shot was given (about 3 in 4 adolescents or 2 in 3 adults)  Redness or swelling where the shot was given (about 1 person in 5)  Mild fever of at least 100.49F (up to about 1 in 25 adolescents or 1 in 100 adults)  Headache (about 3 or 4 people in 10)  Tiredness (about 1 person in 3 or 4)  Nausea, vomiting, diarrhea, stomach ache (up to 1 in 4 adolescents or 1 in 10 adults)  Chills, body aches, sore joints, rash, swollen glands (uncommon) Moderate problems following Tdap (Interfered with activities, but did not require medical attention)  Pain where the shot was given (about 1 in 5 adolescents or 1 in 100 adults)  Redness or swelling where the shot was given (up to about 1 in 16 adolescents or 1 in 25 adults)  Fever over 102F (about 1 in 100 adolescents or 1 in 250 adults)  Headache (about 3 in 20 adolescents or 1 in 10 adults)  Nausea, vomiting, diarrhea, stomach ache (up to 1 or 3 people in 100)  Swelling of the entire arm where the shot was given (up to about 3 in 100). Severe problems following Tdap (Unable to perform usual activities; required medical attention)  Swelling, severe pain, bleeding and redness in the arm where the shot was given (rare). A severe allergic reaction could occur after any vaccine (estimated less than 1 in a million doses). 5. What if there is a serious reaction? What should I look for?  Look for anything that concerns you, such as signs of a severe allergic reaction, very high fever, or behavior changes. Signs of a severe allergic reaction can include hives, swelling of the face and throat, difficulty breathing, a fast heartbeat, dizziness, and weakness. These would start a few minutes to a few hours after the vaccination. What should I do?  If you  think it is a severe allergic reaction or other emergency that can't wait, call 9-1-1 or get the person to the nearest hospital. Otherwise, call your doctor.  Afterward, the reaction should be reported to the "Vaccine Adverse Event Reporting System" (VAERS). Your doctor might file this report, or you can do it yourself through the VAERS web site at www.vaers.LAgents.no, or by calling 1-(910)802-9506. VAERS is only for reporting reactions. They do not give medical advice.  6. The National Vaccine Injury Compensation Program The [employer] Vaccine Injury Compensation Program (VICP) is a federal program that was created to compensate people who may have been injured by certain vaccines. Persons who believe they may have been injured by a vaccine can learn about the program and about filing a claim by calling 1-225-777-8972 or visiting the VICP website at SpiritualWord.at. 7. How can I learn more?  Ask your doctor.  Call your local or state health department.  Contact the Centers for Disease Control and Prevention (CDC):  Call 919-450-8594  or visit CDC's website at PicCapture.uywww.cdc.gov/vaccines. CDC Tdap Vaccine VIS (03/25/12) Document Released: 05/04/2012 Document Revised: 03/20/2014 Document Reviewed: 02/15/2014 ExitCare Patient Information 2015 [place] [place]. This information is not intended to replace advice given to you by your health care provider. Make sure you discuss any questions you have with your health care provider.

## 2015-01-31 ENCOUNTER — Ambulatory Visit (INDEPENDENT_AMBULATORY_CARE_PROVIDER_SITE_OTHER): Payer: BLUE CROSS/BLUE SHIELD | Admitting: Family Medicine

## 2015-01-31 VITALS — BP 150/88 | HR 69 | Temp 98.2°F | Resp 18 | Wt 147.0 lb

## 2015-01-31 DIAGNOSIS — Z4802 Encounter for removal of sutures: Secondary | ICD-10-CM

## 2015-01-31 NOTE — Progress Notes (Signed)
[facility] [address], [place] [address] [phone]  Date:  01/31/2015   Name:  Pam Hart   DOB:  [birth year]   MRN:  [id number]  PCP:  No primary care provider on file.    Chief Complaint: Suture / Staple Removal   History of Present Illness:  Pam Hart is a 54 y.o. very pleasant female patient who presents with the following:  We placed stitches in her left index finger one week ago (3/9).   #5 SI sutures placed. She noted some drainage from the area and applied some antibacterial ointment that seemed to clear this up Overall she is doing ok but she is worried about getting her sutures removed   Patient Active Problem List   Diagnosis Date Noted  . Hyperlipidemia 03/17/2013  . HYPERTENSION, ESSENTIAL NOS 08/05/2007  . FATIGUE 08/05/2007    Past Medical History  Diagnosis Date  . Arthritis   . Anemia   . Thyroid disease   . Hypertension     Past Surgical History  Procedure Laterality Date  . Appendectomy    . Eye surgery Right 2011    lasix     History  Substance Use Topics  . Smoking status: Never Smoker   . Smokeless tobacco: Never Used  . Alcohol Use: No    Family History  Problem Relation Age of Onset  . Arthritis Maternal Aunt   . Hypertension Mother   . Heart disease Mother   . Alzheimer's disease Father   . Glaucoma Father     No Known Allergies  Medication list has been reviewed and updated.  Current Outpatient Prescriptions on File Prior to Visit  Medication Sig Dispense Refill  . amLODipine (NORVASC) 5 MG tablet Take 1 tablet (5 mg total) by mouth daily. 90 tablet 3  . chlorthalidone (HYGROTON) 25 MG tablet Take 1 tablet (25 mg total) by mouth daily. 90 tablet 1  . meloxicam (MOBIC) 15 MG tablet Take 1 tablet (15 mg total) by mouth daily. Take with food, no other NSAIDs 30 tablet 0  . potassium chloride SA (K-DUR,KLOR-CON) 20 MEQ tablet Take 1 tablet (20 mEq total) by mouth daily. 30 tablet 3  .  Prenatal Vit-Fe Fumarate-FA (PRENATAL MULTIVITAMIN) TABS tablet Take 1 tablet by mouth daily at 12 noon. 30 tablet 11  . ranitidine (ZANTAC) 150 MG tablet Take 1 tablet (150 mg total) by mouth 2 (two) times daily. 60 tablet 0  . traMADol (ULTRAM) 50 MG tablet Take 1 tablet (50 mg total) by mouth every 8 (eight) hours as needed. 20 tablet 0   No current facility-administered medications on file prior to visit.    Review of Systems:  As per HPI- otherwise negative.   Physical Examination: Filed Vitals:   01/31/15 1130  BP: 150/88  Pulse: 69  Temp: 98.2 F (36.8 C)  Resp: 18   Filed Vitals:   01/31/15 1130  Weight: 147 lb (66.679 kg)   Body mass index is 27.79 kg/(m^2). Ideal Body Weight:     GEN: WDWN, NAD, Non-toxic, Alert & Oriented x 3 HEENT: Atraumatic, Normocephalic.  Ears and Nose: No external deformity. EXTR: No clubbing/cyanosis/edema NEURO: Normal gait.  PSYCH: Normally interactive. Conversant. Not depressed or anxious appearing.  Calm demeanor.  Well- healed wound in tip of left index finger.  Removed #5 sutures.  She has normal ROM of the finger  Assessment and Plan: Visit for suture removal  Removed as above No current  evidence of infection She would like a fold- over splint to protect the finger which is fine; advised to maintain her ROM however and not leave it on constantly for too long  Signed Abbe Amsterdam, MD

## 2015-01-31 NOTE — Patient Instructions (Signed)
Your finger looks good.  Use the splint to protect the end of the finger as needed.

## 2015-02-21 ENCOUNTER — Ambulatory Visit (INDEPENDENT_AMBULATORY_CARE_PROVIDER_SITE_OTHER): Payer: BLUE CROSS/BLUE SHIELD

## 2015-02-21 ENCOUNTER — Ambulatory Visit (INDEPENDENT_AMBULATORY_CARE_PROVIDER_SITE_OTHER): Payer: BLUE CROSS/BLUE SHIELD | Admitting: Urgent Care

## 2015-02-21 VITALS — BP 132/80 | HR 71 | Temp 98.3°F | Resp 18 | Ht 60.5 in | Wt 148.0 lb

## 2015-02-21 DIAGNOSIS — K5900 Constipation, unspecified: Secondary | ICD-10-CM

## 2015-02-21 DIAGNOSIS — R208 Other disturbances of skin sensation: Secondary | ICD-10-CM

## 2015-02-21 DIAGNOSIS — R1030 Lower abdominal pain, unspecified: Secondary | ICD-10-CM | POA: Diagnosis not present

## 2015-02-21 DIAGNOSIS — M545 Low back pain, unspecified: Secondary | ICD-10-CM

## 2015-02-21 DIAGNOSIS — R200 Anesthesia of skin: Secondary | ICD-10-CM

## 2015-02-21 LAB — COMPREHENSIVE METABOLIC PANEL
ALBUMIN: 4.4 g/dL (ref 3.5–5.2)
ALT: 22 U/L (ref 0–35)
AST: 20 U/L (ref 0–37)
Alkaline Phosphatase: 74 U/L (ref 39–117)
BUN: 8 mg/dL (ref 6–23)
CALCIUM: 9.4 mg/dL (ref 8.4–10.5)
CHLORIDE: 102 meq/L (ref 96–112)
CO2: 28 meq/L (ref 19–32)
Creat: 0.63 mg/dL (ref 0.50–1.10)
Glucose, Bld: 80 mg/dL (ref 70–99)
POTASSIUM: 3.9 meq/L (ref 3.5–5.3)
Sodium: 142 mEq/L (ref 135–145)
Total Bilirubin: 0.3 mg/dL (ref 0.2–1.2)
Total Protein: 7.5 g/dL (ref 6.0–8.3)

## 2015-02-21 LAB — POCT URINALYSIS DIPSTICK
Bilirubin, UA: NEGATIVE
Blood, UA: NEGATIVE
Glucose, UA: NEGATIVE
KETONES UA: NEGATIVE
LEUKOCYTES UA: NEGATIVE
Nitrite, UA: NEGATIVE
PH UA: 6
Protein, UA: NEGATIVE
Urobilinogen, UA: 0.2

## 2015-02-21 LAB — POCT CBC
Granulocyte percent: 53.4 %G (ref 37–80)
HCT, POC: 43.6 % (ref 37.7–47.9)
HEMOGLOBIN: 14.4 g/dL (ref 12.2–16.2)
LYMPH, POC: 3.3 (ref 0.6–3.4)
MCH, POC: 26.1 pg — AB (ref 27–31.2)
MCHC: 33 g/dL (ref 31.8–35.4)
MCV: 79.2 fL — AB (ref 80–97)
MID (cbc): 0.6 (ref 0–0.9)
MPV: 8.2 fL (ref 0–99.8)
POC Granulocyte: 4.4 (ref 2–6.9)
POC LYMPH PERCENT: 39.9 %L (ref 10–50)
POC MID %: 6.7 %M (ref 0–12)
Platelet Count, POC: 310 10*3/uL (ref 142–424)
RBC: 5.51 M/uL — AB (ref 4.04–5.48)
RDW, POC: 14.3 %
WBC: 8.3 10*3/uL (ref 4.6–10.2)

## 2015-02-21 LAB — POCT UA - MICROSCOPIC ONLY
BACTERIA, U MICROSCOPIC: NEGATIVE
CASTS, UR, LPF, POC: NEGATIVE
Crystals, Ur, HPF, POC: NEGATIVE
EPITHELIAL CELLS, URINE PER MICROSCOPY: NEGATIVE
MUCUS UA: NEGATIVE
RBC, urine, microscopic: NEGATIVE
WBC, Ur, HPF, POC: NEGATIVE
YEAST UA: NEGATIVE

## 2015-02-21 LAB — POCT GLYCOSYLATED HEMOGLOBIN (HGB A1C): HEMOGLOBIN A1C: 5.5

## 2015-02-21 MED ORDER — POLYETHYLENE GLYCOL 3350 17 GM/SCOOP PO POWD: 17.0000 g | Freq: Every day | ORAL | Status: DC

## 2015-02-21 MED ORDER — DOCUSATE SODIUM 50 MG PO CAPS: 50.0000 mg | ORAL_CAPSULE | Freq: Two times a day (BID) | ORAL | Status: DC

## 2015-02-21 NOTE — Progress Notes (Signed)
MRN: [id number] DOB: Jan 07, 1961  Subjective:   Pam Hart is a 54 y.o. female presenting for chief complaint of Back Pain and Abdominal Pain  Reports 2 week history of worsening bilateral flank pain, radiates anteriorly to abdomen and pelvic area. The back pain is constant, sharp worse with movement including bending, sitting up or standing for periods of time, pain is worse in the morning but never really goes away including during sleep. Has tried Tramadol, Tylenol and meloxicam with some relief. Has a history of cervical arthritis, gestational diabetes. Also has chills, urinary frequency, fatigue, polydipsia, numbness in feet, fatigue. Denies fevers, nausea, vomiting, diarrhea, constipation, vaginal irritation, genital rashes, chest pain, shob, blurred vision, weight loss. Has 1 BM once a day, sometimes hard but no straining, bloody stool or rectal pain. Admit family history (sister) of diabetes, mother has HTN. Denies smoking or alcohol use. Denies any other aggravating or relieving factors, no other questions or concerns.  Pam Hart has a current medication list which includes the following prescription(s): amlodipine, chlorthalidone, meloxicam, potassium chloride sa, prenatal multivitamin, ranitidine, and tramadol. She has No Known Allergies.  Pam Hart  has a past medical history of Arthritis; Anemia; Thyroid disease; and Hypertension. Also  has past surgical history that includes Appendectomy and Eye surgery (Right, 2011).  ROS As in subjective.  Objective:   Vitals: BP 132/80 mmHg  Pulse 71  Temp(Src) 98.3 F (36.8 C) (Oral)  Resp 18  Ht 5' 0.5" (1.537 m)  Wt 148 lb (67.132 kg)  BMI 28.42 kg/m2  SpO2 100%  Physical Exam  Constitutional: She is well-developed, well-nourished, and in no distress.  Eyes: Conjunctivae are normal. Right eye exhibits no discharge. Left eye exhibits no discharge. No scleral icterus.  Cardiovascular: Normal rate, regular rhythm and intact distal  pulses.  Exam reveals no gallop and no friction rub.   No murmur heard. Pulmonary/Chest: No respiratory distress. She has no wheezes. She has no rales. She exhibits no tenderness.  Abdominal: Soft. Bowel sounds are normal. She exhibits distension (mild-moderate). She exhibits no mass. There is tenderness (reports tenderness of her bilateral flank sides with deep palpation of abdomen).  Musculoskeletal:       Thoracic back: She exhibits normal range of motion, no bony tenderness, no swelling, no edema, no deformity, no laceration and no spasm.       Back:  Skin: Skin is warm and dry. No rash noted. No erythema. No pallor.       [facility] reading (PRIMARY) by  Dr. Cleta Albertsaub and PA-Adilson Grafton. Lumbar: mild disc space narrowing at level of L5-S1. Abd: increased stool burden.  Dg Lumbar Spine 2-3 Views  02/21/2015   CLINICAL DATA:  Bilateral low back pain without sciatic symptoms  EXAM: LUMBAR SPINE - 2-3 VIEW  COMPARISON:  AP and lateral views of the lumbar spine of November 06, 2011  FINDINGS: The lumbar vertebral bodies are preserved in height. The disc space heights are well maintained. There is no spondylolisthesis. The pedicles and transverse processes are intact. The observed portions of the sacrum are normal.  IMPRESSION: There is no acute bony abnormality of the lumbar spine nor evidence of significant degenerative change.   Electronically Signed   By: David  Swazil[place]   On: 02/21/2015 14:39   Dg Abd 1 View  02/21/2015   CLINICAL DATA:  Lower abdominal pain.  EXAM: ABDOMEN - 1 VIEW  COMPARISON:  None.  FINDINGS: There is a moderate amount of stool throughout the colon. There is no  bowel dilatation to suggest obstruction. There is no evidence of pneumoperitoneum, portal venous gas or pneumatosis. There are no pathologic calcifications along the expected course of the ureters.The osseous structures are unremarkable.  IMPRESSION: Moderate amount of stool throughout the colon.   Electronically Signed   By: Elige Ko   On: 02/21/2015 14:43   Results for orders placed or performed in visit on 02/21/15 (from the past 24 hour(s))  POCT urinalysis dipstick     Status: None   Collection Time: 02/21/15 12:37 PM  Result Value Ref Range   Color, UA yellow    Clarity, UA clear    Glucose, UA neg    Bilirubin, UA neg    Ketones, UA neg    Spec Grav, UA <=1.005    Blood, UA neg    pH, UA 6.0    Protein, UA neg    Urobilinogen, UA 0.2    Nitrite, UA neg    Leukocytes, UA Negative   POCT UA - Microscopic Only     Status: None   Collection Time: 02/21/15 12:37 PM  Result Value Ref Range   WBC, Ur, HPF, POC neg    RBC, urine, microscopic neg    Bacteria, U Microscopic neg    Mucus, UA neg    Epithelial cells, urine per micros neg    Crystals, Ur, HPF, POC neg    Casts, Ur, LPF, POC neg    Yeast, UA neg   POCT CBC     Status: Abnormal   Collection Time: 02/21/15  2:18 PM  Result Value Ref Range   WBC 8.3 4.6 - 10.2 K/uL   Lymph, poc 3.3 0.6 - 3.4   POC LYMPH PERCENT 39.9 10 - 50 %L   MID (cbc) 0.6 0 - 0.9   POC MID % 6.7 0 - 12 %M   POC Granulocyte 4.4 2 - 6.9   Granulocyte percent 53.4 37 - 80 %G   RBC 5.51 (A) 4.04 - 5.48 M/uL   Hemoglobin 14.4 12.2 - 16.2 g/dL   HCT, POC 40.9 81.1 - 47.9 %   MCV 79.2 (A) 80 - 97 fL   MCH, POC 26.1 (A) 27 - 31.2 pg   MCHC 33.0 31.8 - 35.4 g/dL   RDW, POC 91.4 %   Platelet Count, POC 310 142 - 424 K/uL   MPV 8.2 0 - 99.8 fL  POCT glycosylated hemoglobin (Hb A1C)     Status: None   Collection Time: 02/21/15  2:18 PM  Result Value Ref Range   Hemoglobin A1C 5.5    Assessment and Plan :   1. Constipation, unspecified constipation type 2. Bilateral low back pain without sciatica 3. Lower abdominal pain - Labs pending, diffuse generalized pain likely due to constipation. Advised to start miralax and docusate, stop using Tramadol. Suggested she use Tylenol or meloxicam for arthritis of her neck or other types of pain. - Return in 1 week if no  improvement in symptoms.  4. Numbness of feet - Diabetes screen was negative. Will monitor symptoms, recheck in 1 week if no improvement.  Wallis Bamberg, PA-C [facility] [phone] 02/21/2015 1:39 PM

## 2015-02-21 NOTE — Patient Instructions (Signed)

## 2015-02-22 ENCOUNTER — Encounter: Payer: Self-pay | Admitting: Urgent Care

## 2015-02-22 LAB — URINE CULTURE
COLONY COUNT: NO GROWTH
Organism ID, Bacteria: NO GROWTH

## 2015-03-23 ENCOUNTER — Ambulatory Visit (INDEPENDENT_AMBULATORY_CARE_PROVIDER_SITE_OTHER): Payer: BLUE CROSS/BLUE SHIELD | Admitting: Emergency Medicine

## 2015-03-23 VITALS — BP 132/80 | HR 72 | Temp 98.2°F | Resp 20 | Ht 60.05 in | Wt 148.2 lb

## 2015-03-23 DIAGNOSIS — R1013 Epigastric pain: Secondary | ICD-10-CM

## 2015-03-23 DIAGNOSIS — K2990 Gastroduodenitis, unspecified, without bleeding: Secondary | ICD-10-CM | POA: Diagnosis not present

## 2015-03-23 DIAGNOSIS — K5900 Constipation, unspecified: Secondary | ICD-10-CM | POA: Diagnosis not present

## 2015-03-23 DIAGNOSIS — K2970 Gastritis, unspecified, without bleeding: Secondary | ICD-10-CM | POA: Diagnosis not present

## 2015-03-23 LAB — POCT CBC
GRANULOCYTE PERCENT: 62.8 % (ref 37–80)
HCT, POC: 42.3 % (ref 37.7–47.9)
HEMOGLOBIN: 13.8 g/dL (ref 12.2–16.2)
Lymph, poc: 2 (ref 0.6–3.4)
MCH, POC: 26 pg — AB (ref 27–31.2)
MCHC: 32.6 g/dL (ref 31.8–35.4)
MCV: 79.8 fL — AB (ref 80–97)
MID (CBC): 0.3 (ref 0–0.9)
MPV: 8.5 fL (ref 0–99.8)
PLATELET COUNT, POC: 267 10*3/uL (ref 142–424)
POC Granulocyte: 3.8 (ref 2–6.9)
POC LYMPH PERCENT: 32.5 %L (ref 10–50)
POC MID %: 4.7 %M (ref 0–12)
RBC: 5.3 M/uL (ref 4.04–5.48)
RDW, POC: 14.3 %
WBC: 6 10*3/uL (ref 4.6–10.2)

## 2015-03-23 MED ORDER — SUCRALFATE 1 G PO TABS: ORAL_TABLET | ORAL | Status: DC

## 2015-03-23 MED ORDER — LANSOPRAZOLE 30 MG PO CPDR: 30.0000 mg | DELAYED_RELEASE_CAPSULE | Freq: Every day | ORAL | Status: DC

## 2015-03-23 MED ORDER — DEXLANSOPRAZOLE 60 MG PO CPDR: 60.0000 mg | DELAYED_RELEASE_CAPSULE | Freq: Every day | ORAL | Status: DC

## 2015-03-23 MED ORDER — POLYETHYLENE GLYCOL 3350 17 GM/SCOOP PO POWD: 17.0000 g | Freq: Two times a day (BID) | ORAL | Status: DC | PRN

## 2015-03-23 NOTE — Progress Notes (Signed)
[facility] [address], [place] [address] [phone]  Date:  03/23/2015   Name:  Pam NovemberHanan M Hart   DOB:  [birth year]   MRN:  [id number]  PCP:  No primary care provider on file.    Chief Complaint: Abdominal Pain; Back Pain; and Fecal Impaction   History of Present Illness:  Pam Hart is a 54 y.o. very pleasant female patient who presents with the following:  Patient seen last month for constipation and pain. Describes hearturn over years that has worsened.  Now feels impacted.  The patient has no complaint of blood, mucous, or pus in her stools. No nausea or vomiting.   No fever or chills. No specific food intolerance. Pain worse at night.  No alcohol or tobacco.  No excess caffeine. No improvement with PPI Never scoped No improvement with over the counter medications or other home remedies. Denies other complaint or health concern today.   Patient Active Problem List   Diagnosis Date Noted  . Hyperlipidemia 03/17/2013  . HYPERTENSION, ESSENTIAL NOS 08/05/2007  . FATIGUE 08/05/2007    Past Medical History  Diagnosis Date  . Arthritis   . Anemia   . Thyroid disease   . Hypertension     Past Surgical History  Procedure Laterality Date  . Appendectomy    . Eye surgery Right 2011    lasix     History  Substance Use Topics  . Smoking status: Never Smoker   . Smokeless tobacco: Never Used  . Alcohol Use: No    Family History  Problem Relation Age of Onset  . Arthritis Maternal Aunt   . Hypertension Mother   . Heart disease Mother   . Alzheimer's disease Father   . Glaucoma Father     No Known Allergies  Medication list has been reviewed and updated.  Current Outpatient Prescriptions on File Prior to Visit  Medication Sig Dispense Refill  . amLODipine (NORVASC) 5 MG tablet Take 1 tablet (5 mg total) by mouth daily. 90 tablet 3  . chlorthalidone (HYGROTON) 25 MG tablet Take 1 tablet (25 mg total) by mouth daily. 90  tablet 1  . meloxicam (MOBIC) 15 MG tablet Take 1 tablet (15 mg total) by mouth daily. Take with food, no other NSAIDs 30 tablet 0  . docusate sodium (COLACE) 50 MG capsule Take 1 capsule (50 mg total) by mouth 2 (two) times daily. (Patient not taking: Reported on 03/23/2015) 30 capsule 0  . polyethylene glycol powder (GLYCOLAX/MIRALAX) powder Take 17 g by mouth daily. (Patient not taking: Reported on 03/23/2015) 500 g 1  . ranitidine (ZANTAC) 150 MG tablet Take 1 tablet (150 mg total) by mouth 2 (two) times daily. (Patient not taking: Reported on 03/23/2015) 60 tablet 0  . traMADol (ULTRAM) 50 MG tablet Take 1 tablet (50 mg total) by mouth every 8 (eight) hours as needed. (Patient not taking: Reported on 03/23/2015) 20 tablet 0   No current facility-administered medications on file prior to visit.    Review of Systems:  Review of Systems  Constitutional: Negative for fever, chills and fatigue.  HENT: Negative for congestion, ear pain, hearing loss, postnasal drip, rhinorrhea and sinus pressure.   Eyes: Negative for discharge and redness.  Respiratory: Negative for cough, shortness of breath and wheezing.   Cardiovascular: Negative for chest pain and leg swelling.  Gastrointestinal: Negative for nausea, vomiting, abdominal pain, constipation and blood in stool.  Genitourinary: Negative for dysuria, urgency and frequency.  Musculoskeletal: Negative for neck stiffness.  Skin: Negative for rash.  Neurological: Negative for seizures, weakness and headaches.   Physical Examination: Filed Vitals:   03/23/15 1157  BP: 132/80  Pulse: 72  Temp: 98.2 F (36.8 C)  Resp: 20   Filed Vitals:   03/23/15 1157  Height: 5' 0.05" (1.525 m)  Weight: 148 lb 3.2 oz (67.223 kg)   Body mass index is 28.91 kg/(m^2). Ideal Body Weight: Weight in (lb) to have BMI = 25: 128  GEN: WDWN, NAD, Non-toxic, A & O x 3 HEENT: Atraumatic, Normocephalic. Neck supple. No masses, No LAD. Ears and Nose: No external  deformity. CV: RRR, No M/G/R. No JVD. No thrill. No extra heart sounds. PULM: CTA B, no wheezes, crackles, rhonchi. No retractions. No resp. distress. No accessory muscle use. ABD: S, NT, ND, +BS. No rebound. No HSM. EXTR: No c/c/e NEURO Normal gait.  PSYCH: Normally interactive. Conversant. Not depressed or anxious appearing.  Calm demeanor.    Assessment and Plan: Constipation ?GERD vs PUD Never scoped  Will refer GI and check Hpylori dexilant and carafate miralax   Signed Phillips OdorJeffery Pieper Kasik, MD

## 2015-03-23 NOTE — Patient Instructions (Signed)

## 2015-03-26 LAB — H. PYLORI BREATH TEST: H. PYLORI BREATH TEST: DETECTED — AB

## 2015-03-27 ENCOUNTER — Other Ambulatory Visit: Payer: Self-pay | Admitting: Emergency Medicine

## 2015-03-27 MED ORDER — AMOXICILL-CLARITHRO-LANSOPRAZ PO MISC
Freq: Two times a day (BID) | ORAL | Status: DC
Start: 2015-03-27 — End: 2015-08-22

## 2015-04-12 ENCOUNTER — Encounter: Payer: Self-pay | Admitting: Physician Assistant

## 2015-04-12 ENCOUNTER — Ambulatory Visit (INDEPENDENT_AMBULATORY_CARE_PROVIDER_SITE_OTHER): Payer: BLUE CROSS/BLUE SHIELD | Admitting: Physician Assistant

## 2015-04-12 VITALS — BP 148/72 | HR 72 | Ht 60.5 in | Wt 148.1 lb

## 2015-04-12 DIAGNOSIS — K2970 Gastritis, unspecified, without bleeding: Secondary | ICD-10-CM

## 2015-04-12 DIAGNOSIS — R1011 Right upper quadrant pain: Secondary | ICD-10-CM | POA: Diagnosis not present

## 2015-04-12 DIAGNOSIS — K219 Gastro-esophageal reflux disease without esophagitis: Secondary | ICD-10-CM

## 2015-04-12 DIAGNOSIS — B9681 Helicobacter pylori [H. pylori] as the cause of diseases classified elsewhere: Secondary | ICD-10-CM

## 2015-04-12 DIAGNOSIS — R1013 Epigastric pain: Secondary | ICD-10-CM | POA: Diagnosis not present

## 2015-04-12 DIAGNOSIS — K5909 Other constipation: Secondary | ICD-10-CM

## 2015-04-12 MED ORDER — POLYETHYLENE GLYCOL 3350 17 GM/SCOOP PO POWD: 17.0000 g | Freq: Two times a day (BID) | ORAL | Status: DC | PRN

## 2015-04-12 MED ORDER — OMEPRAZOLE 20 MG PO CPDR: 20.0000 mg | DELAYED_RELEASE_CAPSULE | Freq: Every day | ORAL | Status: DC

## 2015-04-12 NOTE — Patient Instructions (Addendum)
We sent prescriptions for Omeprazole 20 mg [facility] [address]. Also Generic Miralax.  You [facility] on 6-13 Mon  at 8:30 PM  Please arrive at   prior to your appointment for registration. Make certain not to have anything to eat or drink 6 hours prior to your appointment. Should you need to reschedule your appointment, please contact radiology at [phone]. This test typically takes about 30 minutes to perform.   You have been scheduled for an endoscopy. Please follow written instructions given to you at your visit today. If you use inhalers (even only as needed), please bring them with you on the day of your procedure.  Normal BMI (Body Mass Index- based on height and weight) is between 19 and 25. Your BMI today is Body mass index is 28.44 kg/(m^2). Marland Kitchen. Please consider follow up  regarding your BMI with your Primary Care Provider.

## 2015-04-12 NOTE — Progress Notes (Signed)
Patient ID: Stephens NovemberHanan M Foglesong, female   DOB: [birth year], 54 y.o.   MRN: [id number]   Subjective:    Patient ID: Stephens NovemberHanan M Bansal, female    DOB: [birth year], 54 y.o.   MRN: [id number]  HPI Buford DresserHanan is a 54 year old female from the [place] who does speak English, referred by Dr. Marice PotterJeff Anderson for evaluation of epigastric pain and constipation. She reports heartburn on a regular basis over the past year and a 3 week history of epigastric pain and burning which a been fairly constant somewhat worse with swallowing and eating this was not associated with nausea vomiting fever or chills. He also relates intermittent pains in the right upper quadrant. Has been having chronic problems with constipation and sense when her bowels haven't moved for several days she will also have discomfort on the left side. She has tried several over-the-counter products without success including stool softeners and laxatives and is currently using enemas as needed. No melena or hematochezia. She says at its  worst she may go for 2 weeks without a bowel movement. Primary care evaluation with recent labs unremarkable H pylori breath test was done and was positive on 03/27/2015 and she is currently completing a Prevpac. Family history is negative for colon cancer polyps or inflammatory bowel disease. She has not had any prior GI workup.  Review of Systems Pertinent positive and negative review of systems were noted in the above HPI section.  All other review of systems was otherwise negative.  Outpatient Encounter Prescriptions as of 04/12/2015  Medication Sig  . amLODipine (NORVASC) 5 MG tablet Take 1 tablet (5 mg total) by mouth daily.  Marland Kitchen. amoxicillin-clarithromycin-lansoprazole (PREVPAC) combo pack Take by mouth 2 (two) times daily. Follow package directions.  Dispense as generic components  . chlorthalidone (HYGROTON) 25 MG tablet Take 1 tablet (25 mg total) by mouth daily.  Marland Kitchen. dexlansoprazole (DEXILANT) 60 MG capsule Take 1 capsule  (60 mg total) by mouth daily.  Marland Kitchen. docusate sodium (COLACE) 50 MG capsule Take 50 mg by mouth as needed for mild constipation.  . meloxicam (MOBIC) 15 MG tablet Take 1 tablet (15 mg total) by mouth daily. Take with food, no other NSAIDs  . polyethylene glycol powder (GLYCOLAX/MIRALAX) powder Take 17 g by mouth 2 (two) times daily as needed.  . ranitidine (ZANTAC) 150 MG tablet Take 1 tablet (150 mg total) by mouth 2 (two) times daily.  . traMADol (ULTRAM) 50 MG tablet Take 1 tablet (50 mg total) by mouth every 8 (eight) hours as needed.  . [DISCONTINUED] docusate sodium (COLACE) 50 MG capsule Take 1 capsule (50 mg total) by mouth 2 (two) times daily.  . [DISCONTINUED] polyethylene glycol powder (GLYCOLAX/MIRALAX) powder Take 17 g by mouth daily.  . [DISCONTINUED] polyethylene glycol powder (GLYCOLAX/MIRALAX) powder Take 17 g by mouth 2 (two) times daily as needed.  . [DISCONTINUED] sucralfate (CARAFATE) 1 G tablet 1 tablet 1 hr ac and hs  . omeprazole (PRILOSEC) 20 MG capsule Take 1 capsule (20 mg total) by mouth daily.   No facility-administered encounter medications on file as of 04/12/2015.   No Known Allergies Patient Active Problem List   Diagnosis Date Noted  . Hyperlipidemia 03/17/2013  . HYPERTENSION, ESSENTIAL NOS 08/05/2007  . FATIGUE 08/05/2007   History   Social History  . Marital Status: Married    Spouse Name: N/A  . Number of Children: 4  . Years of Education: N/A   Occupational History  . Interpreter    Social History Main  Topics  . Smoking status: Never Smoker   . Smokeless tobacco: Never Used  . Alcohol Use: No  . Drug Use: No  . Sexual Activity: Not on file   Other Topics Concern  . Not on file   Social History Narrative    Ms. Mitchelle's family history includes Alzheimer's disease in her father; Arthritis in her maternal aunt; Glaucoma in her father; Heart disease in her mother; Hypertension in her mother. There is no history of Colon cancer, Colon polyps,  Esophageal cancer, Kidney disease, or Gallbladder disease.      Objective:    Filed Vitals:   04/12/15 1035  BP: 148/72  Pulse:     Physical Exam  well-developed well-nourished Arabic female in no acute distress, blood pressure 148/72, weight 148 height 5 foot BMI 28. HEENT ;nontraumatic normocephalic EOMI PERRLA sclera anicteric, Cardiovascular; regular rate and rhythm with S1-S2 no murmur or gallop, Pulmonary clear bilaterally, Abdomen ;shows mild tenderness in the epigastrium and left upper quadrant there is no guarding or rebound no palpable mass or hepatosplenomegaly, Bowel sounds are present, Rectal; exam not done, Ext; no clubbing cyanosis or edema skin warm and dry, Psych ;mood and affect appropriate       Assessment & Plan:   #1 54 yo female with Chronic GERD #2 epigastric pain x one month-R/O gastritis vs GB disease #3 Hpylori + and being treated #4 constipation  Plan; Complete Prevpak, then start Omeprazole 20 mg po daily in am Schedule upper Abdominal US Schedule for Colonoscopy with Dr. Leone Payor - procedure discussed in detail with pt and she is agreeable to proceed.  Start Miralax 17 gm in 8 oz water once or twice daily- consider Linzess if colon negative     Alanny Rivers S Brexton Sofia PA-C 04/12/2015   Cc: Carmelina Dane, MD

## 2015-04-19 ENCOUNTER — Encounter: Payer: Self-pay | Admitting: Internal Medicine

## 2015-04-20 NOTE — Progress Notes (Signed)
Agree with Ms. Esterwood's assessment and plan. Zynia Wojtowicz E. Jazlyne Gauger, MD, FACG   

## 2015-04-26 ENCOUNTER — Ambulatory Visit (HOSPITAL_COMMUNITY): Payer: BLUE CROSS/BLUE SHIELD

## 2015-04-30 ENCOUNTER — Ambulatory Visit (HOSPITAL_COMMUNITY): Payer: BLUE CROSS/BLUE SHIELD

## 2015-04-30 ENCOUNTER — Ambulatory Visit (HOSPITAL_COMMUNITY)
Admission: RE | Admit: 2015-04-30 | Discharge: 2015-04-30 | Disposition: A | Discharge status: Home / Self Care | Payer: BLUE CROSS/BLUE SHIELD | Source: Ambulatory Visit | Attending: Physician Assistant | Admitting: Physician Assistant

## 2015-04-30 DIAGNOSIS — K219 Gastro-esophageal reflux disease without esophagitis: Secondary | ICD-10-CM

## 2015-04-30 DIAGNOSIS — B9681 Helicobacter pylori [H. pylori] as the cause of diseases classified elsewhere: Secondary | ICD-10-CM

## 2015-04-30 DIAGNOSIS — R1013 Epigastric pain: Secondary | ICD-10-CM

## 2015-04-30 DIAGNOSIS — K2970 Gastritis, unspecified, without bleeding: Secondary | ICD-10-CM

## 2015-04-30 DIAGNOSIS — R1010 Upper abdominal pain, unspecified: Secondary | ICD-10-CM | POA: Diagnosis not present

## 2015-04-30 DIAGNOSIS — R1011 Right upper quadrant pain: Secondary | ICD-10-CM

## 2015-04-30 DIAGNOSIS — K5909 Other constipation: Secondary | ICD-10-CM

## 2015-05-01 ENCOUNTER — Encounter: Payer: BLUE CROSS/BLUE SHIELD | Admitting: Internal Medicine

## 2015-05-28 ENCOUNTER — Other Ambulatory Visit: Payer: Self-pay | Admitting: Physician Assistant

## 2015-05-28 DIAGNOSIS — M2550 Pain in unspecified joint: Secondary | ICD-10-CM

## 2015-05-29 NOTE — Telephone Encounter (Signed)
Pt needs to return for further refills.

## 2015-08-21 ENCOUNTER — Emergency Department (HOSPITAL_COMMUNITY): Payer: BLUE CROSS/BLUE SHIELD

## 2015-08-21 ENCOUNTER — Observation Stay (HOSPITAL_COMMUNITY)
Admission: EM | Admit: 2015-08-21 | Discharge: 2015-08-22 | Disposition: A | Discharge status: Home / Self Care | Payer: BLUE CROSS/BLUE SHIELD | Attending: Family Medicine | Admitting: Family Medicine

## 2015-08-21 ENCOUNTER — Encounter (HOSPITAL_COMMUNITY): Payer: Self-pay | Admitting: Emergency Medicine

## 2015-08-21 DIAGNOSIS — R079 Chest pain, unspecified: Secondary | ICD-10-CM | POA: Diagnosis present

## 2015-08-21 DIAGNOSIS — K219 Gastro-esophageal reflux disease without esophagitis: Secondary | ICD-10-CM | POA: Diagnosis not present

## 2015-08-21 DIAGNOSIS — I517 Cardiomegaly: Secondary | ICD-10-CM | POA: Insufficient documentation

## 2015-08-21 DIAGNOSIS — I10 Essential (primary) hypertension: Principal | ICD-10-CM | POA: Diagnosis present

## 2015-08-21 DIAGNOSIS — R0602 Shortness of breath: Secondary | ICD-10-CM | POA: Insufficient documentation

## 2015-08-21 LAB — BASIC METABOLIC PANEL
Anion gap: 5 (ref 5–15)
BUN: 14 mg/dL (ref 6–20)
CALCIUM: 8.9 mg/dL (ref 8.9–10.3)
CHLORIDE: 104 mmol/L (ref 101–111)
CO2: 30 mmol/L (ref 22–32)
Creatinine, Ser: 0.75 mg/dL (ref 0.44–1.00)
GFR calc Af Amer: >60 mL/min (ref >60)
GFR calc non Af Amer: >60 mL/min (ref >60)
Glucose, Bld: 112 mg/dL — ABNORMAL HIGH (ref 65–99)
Potassium: 3.5 mmol/L (ref 3.5–5.1)
SODIUM: 139 mmol/L (ref 135–145)

## 2015-08-21 LAB — CBC
HCT: 39.3 % (ref 36.0–46.0)
Hemoglobin: 12.9 g/dL (ref 12.0–15.0)
MCH: 26.9 pg (ref 26.0–34.0)
MCHC: 32.8 g/dL (ref 30.0–36.0)
MCV: 82 fL (ref 78.0–100.0)
PLATELETS: 275 10*3/uL (ref 150–400)
RBC: 4.79 MIL/uL (ref 3.87–5.11)
RDW: 13.7 % (ref 11.5–15.5)
WBC: 7.4 10*3/uL (ref 4.0–10.5)

## 2015-08-21 LAB — I-STAT TROPONIN, ED
TROPONIN I, POC: 0 ng/mL (ref 0.00–0.08)
TROPONIN I, POC: 0 ng/mL (ref 0.00–0.08)

## 2015-08-21 MED ORDER — ASPIRIN 81 MG PO CHEW
324.0000 mg | CHEWABLE_TABLET | Freq: Once | ORAL | Status: AC
  Administered 2015-08-21: 324 mg via ORAL
  Filled 2015-08-21: qty 4

## 2015-08-21 MED ORDER — MORPHINE SULFATE (PF) 4 MG/ML IV SOLN
4.0000 mg | Freq: Once | INTRAVENOUS | Status: AC
  Administered 2015-08-21: 4 mg via INTRAVENOUS
  Filled 2015-08-21: qty 1

## 2015-08-21 NOTE — ED Notes (Signed)
Pt c/o mid chest pain radiating to left shoulder and back. Has had associated SOB and fatigue. Denies nausea. Endorses diarrhea and abdominal pain. RR even/unlabord. No cardiac hx. Says she feels "shaky." Has eaten today. No other c/c.

## 2015-08-22 ENCOUNTER — Ambulatory Visit (HOSPITAL_COMMUNITY)
Admit: 2015-08-22 | Discharge: 2015-08-22 | Disposition: A | Discharge status: Home / Self Care | Payer: BLUE CROSS/BLUE SHIELD | Attending: Cardiology | Admitting: Cardiology

## 2015-08-22 ENCOUNTER — Other Ambulatory Visit: Payer: Self-pay | Admitting: Cardiology

## 2015-08-22 ENCOUNTER — Observation Stay (HOSPITAL_COMMUNITY): Payer: BLUE CROSS/BLUE SHIELD

## 2015-08-22 ENCOUNTER — Ambulatory Visit (HOSPITAL_BASED_OUTPATIENT_CLINIC_OR_DEPARTMENT_OTHER)
Admit: 2015-08-22 | Discharge: 2015-08-22 | Disposition: A | Discharge status: Home / Self Care | Payer: BLUE CROSS/BLUE SHIELD | Attending: Cardiology | Admitting: Cardiology

## 2015-08-22 ENCOUNTER — Encounter (HOSPITAL_COMMUNITY): Payer: Self-pay | Admitting: Internal Medicine

## 2015-08-22 DIAGNOSIS — R079 Chest pain, unspecified: Secondary | ICD-10-CM

## 2015-08-22 DIAGNOSIS — I10 Essential (primary) hypertension: Secondary | ICD-10-CM | POA: Diagnosis not present

## 2015-08-22 DIAGNOSIS — I517 Cardiomegaly: Secondary | ICD-10-CM

## 2015-08-22 DIAGNOSIS — K219 Gastro-esophageal reflux disease without esophagitis: Secondary | ICD-10-CM | POA: Diagnosis present

## 2015-08-22 LAB — NM MYOCAR MULTI W/SPECT W/WALL MOTION / EF
Estimated workload: 9 METS
Exercise duration (min): 7 min
Exercise duration (sec): 21 s
LV dias vol: 73 mL
LV sys vol: 30 mL
MPHR: 165 {beats}/min
Peak HR: 157 {beats}/min
Percent HR: 95 %
RATE: 0.21
RPE: 19
Rest HR: 61 {beats}/min
SDS: 4
SRS: 5
SSS: 6
TID: 1.09

## 2015-08-22 LAB — CBC
HEMATOCRIT: 38.5 % (ref 36.0–46.0)
HEMOGLOBIN: 12.6 g/dL (ref 12.0–15.0)
MCH: 27 pg (ref 26.0–34.0)
MCHC: 32.7 g/dL (ref 30.0–36.0)
MCV: 82.6 fL (ref 78.0–100.0)
Platelets: 257 10*3/uL (ref 150–400)
RBC: 4.66 MIL/uL (ref 3.87–5.11)
RDW: 13.8 % (ref 11.5–15.5)
WBC: 6.4 10*3/uL (ref 4.0–10.5)

## 2015-08-22 LAB — CREATININE, SERUM
Creatinine, Ser: 0.78 mg/dL (ref 0.44–1.00)
GFR calc Af Amer: >60 mL/min (ref >60)
GFR calc non Af Amer: >60 mL/min (ref >60)

## 2015-08-22 LAB — TROPONIN I
Troponin I: <0.03 ng/mL (ref <0.031)
Troponin I: <0.03 ng/mL (ref <0.031)
Troponin I: <0.03 ng/mL (ref <0.031)

## 2015-08-22 LAB — D-DIMER, QUANTITATIVE (NOT AT ARMC)

## 2015-08-22 MED ORDER — ONDANSETRON HCL 4 MG/2ML IJ SOLN: 4.0000 mg | Freq: Four times a day (QID) | INTRAMUSCULAR | Status: DC | PRN

## 2015-08-22 MED ORDER — TECHNETIUM TC 99M SESTAMIBI GENERIC - CARDIOLITE
30.0000 | Freq: Once | INTRAVENOUS | Status: AC | PRN
  Administered 2015-08-22: 30 via INTRAVENOUS

## 2015-08-22 MED ORDER — HYDRALAZINE HCL 20 MG/ML IJ SOLN: 10.0000 mg | INTRAMUSCULAR | Status: DC | PRN

## 2015-08-22 MED ORDER — AMLODIPINE BESYLATE 5 MG PO TABS: 5.0000 mg | ORAL_TABLET | Freq: Every day | ORAL | Status: DC

## 2015-08-22 MED ORDER — TECHNETIUM TC 99M SESTAMIBI GENERIC - CARDIOLITE
10.0000 | Freq: Once | INTRAVENOUS | Status: AC | PRN
  Administered 2015-08-22: 10 via INTRAVENOUS

## 2015-08-22 MED ORDER — MORPHINE SULFATE (PF) 2 MG/ML IV SOLN: 2.0000 mg | INTRAVENOUS | Status: DC | PRN

## 2015-08-22 MED ORDER — ENOXAPARIN SODIUM 40 MG/0.4ML ~~LOC~~ SOLN
40.0000 mg | SUBCUTANEOUS | Status: DC
  Filled 2015-08-22: qty 0.4

## 2015-08-22 MED ORDER — HYDRALAZINE HCL 25 MG PO TABS: 25.0000 mg | ORAL_TABLET | Freq: Four times a day (QID) | ORAL | Status: DC | PRN

## 2015-08-22 MED ORDER — HYDROCHLOROTHIAZIDE 12.5 MG PO TABS: 12.5000 mg | ORAL_TABLET | Freq: Every day | ORAL | Status: DC

## 2015-08-22 MED ORDER — PANTOPRAZOLE SODIUM 40 MG PO TBEC
40.0000 mg | DELAYED_RELEASE_TABLET | Freq: Every day | ORAL | Status: DC
  Administered 2015-08-22: 40 mg via ORAL
  Filled 2015-08-22: qty 1

## 2015-08-22 MED ORDER — ACETAMINOPHEN 325 MG PO TABS
650.0000 mg | ORAL_TABLET | ORAL | Status: DC | PRN
  Administered 2015-08-22: 650 mg via ORAL
  Filled 2015-08-22: qty 2

## 2015-08-22 MED ORDER — AMLODIPINE BESYLATE 5 MG PO TABS
5.0000 mg | ORAL_TABLET | Freq: Every day | ORAL | Status: DC
  Administered 2015-08-22: 5 mg via ORAL
  Filled 2015-08-22: qty 1

## 2015-08-22 MED ORDER — NITROGLYCERIN 0.4 MG SL SUBL
0.4000 mg | SUBLINGUAL_TABLET | SUBLINGUAL | Status: DC | PRN
  Administered 2015-08-22: 0.4 mg via SUBLINGUAL
  Filled 2015-08-22: qty 1

## 2015-08-22 MED ORDER — METHOCARBAMOL 500 MG PO TABS: 500.0000 mg | ORAL_TABLET | Freq: Four times a day (QID) | ORAL | Status: DC | PRN

## 2015-08-22 MED ORDER — ASPIRIN EC 325 MG PO TBEC
325.0000 mg | DELAYED_RELEASE_TABLET | Freq: Every day | ORAL | Status: DC
  Administered 2015-08-22: 325 mg via ORAL
  Filled 2015-08-22: qty 1

## 2015-08-22 NOTE — Progress Notes (Signed)
Patient admitted this morning, see detailed H&P by Dr. Toniann Fail. Subjective: 54 y.o. female history of hypertension has been explained chest pain off and on for last 1 week. Patient's chest pain is mostly retrosternal radiating to left. Patient feels it like a pressure-like. Yesterday it became more persistent. Patient's chest pain is present even at rest. Has some mild shortness of breath during chest pain episodes. Denies any associated productive cough fever chills.  Filed Vitals:   08/22/15 1345  BP: 179/81  Pulse:   Temp:   Resp:     Chest: Clear Bilaterally Heart : S1S2 RRR Abdomen: Soft, nontender Ext : No edema Neuro: Alert, oriented x 3  A/P Chest pain Hypertension, uncontrolled History of GERD  Patient was seen by cardiology and plan for nuclear Myoview. Will await the final recommendations per cardiology. Continue amlodipine, will start hydralazine 25 mg by mouth every 6 hours when necessary for BP greater than 160/100.    Meredeth Ide [facility] [phone]

## 2015-08-22 NOTE — Progress Notes (Signed)
Patient barely finished 1 day treadmill myoview due to fatigue, however did reach target HR for >1 min. Pending final result by [facility] reader.  She was hypertensive with SBP 190s both before and after treadmill myoview, likely need further BP control  Signed, Azalee Course PA Pager: [phone]

## 2015-08-22 NOTE — Discharge Summary (Addendum)
Physician Discharge Summary  ABRIL CAPPIELLO ZOX:[id number] DOB: [birth year] DOA: 08/21/2015  PCP: No primary care provider on file.  Admit date: 08/21/2015 Discharge date: 08/22/2015  Time spent: *25 minutes  Recommendations for Outpatient Follow-up:  1. *Follow up PCP in 2 weeks  Discharge Diagnoses:  Active Problems:   Hypertension, uncontrolled   Chest pain with moderate risk of acute coronary syndrome   GERD (gastroesophageal reflux disease)   Discharge Condition: Stable  Diet recommendation: Low salt diet  Filed Weights   08/22/15 0127  Weight: 63.821 kg (140 lb 11.2 oz)    History of present illness:  54 y.o. female history of hypertension has been explained chest pain off and on for last 1 week. Patient's chest pain is mostly retrosternal radiating to left. Patient feels it like a pressure-like. Yesterday it became more persistent. Patient's chest pain is present even at rest. Has some mild shortness of breath during chest pain episodes. Denies any associated productive cough fever chills.   Hospital Course:   Chest pain- patient was seen by cardiology, underwent nuclear Myoview which was negative. At this time cardiology recommends patient be discharged, cardiology will follow-up as outpatient for echocardiogram. Patient likely has atypical chest pain which is now resolved.  Hypertension- blood pressure control, patient only takes amlodipine 5 mg by mouth daily. She was taking chlorthalidone in the past and she is not taking anymore. Will start Hydrocort thiazide 12.5 mg by mouth daily in addition to amlodipine 5 mg by mouth daily.  History of GERD- continue omeprazole.  Procedures:   Myoview  Consultations:  Cardiology  Discharge Exam: Filed Vitals:   08/22/15 1634  BP: 147/59  Pulse: 66  Temp: 98.5 F (36.9 C)  Resp: 18    General: Appears in no acute distress Cardiovascular: S1-S2 regular Respiratory: Clear to auscultation bilaterally  Discharge  Instructions   Discharge Instructions    Diet - low sodium heart healthy    Complete by:  As directed      Increase activity slowly    Complete by:  As directed           Current Discharge Medication List    START taking these medications   Details  hydrochlorothiazide (HYDRODIURIL) 12.5 MG tablet Take 1 tablet (12.5 mg total) by mouth daily. Qty: 30 tablet, Refills: 3      CONTINUE these medications which have CHANGED   Details  amLODipine (NORVASC) 5 MG tablet Take 1 tablet (5 mg total) by mouth daily. Qty: 30 tablet, Refills: 3   Associated Diagnoses: Essential hypertension      CONTINUE these medications which have NOT CHANGED   Details  methocarbamol (ROBAXIN) 500 MG tablet Take 500 mg by mouth every 6 (six) hours as needed for muscle spasms.     dexlansoprazole (DEXILANT) 60 MG capsule Take 1 capsule (60 mg total) by mouth daily. Qty: 30 capsule, Refills: 5    meloxicam (MOBIC) 15 MG tablet Take 1 tablet (15 mg total) by mouth daily. Take with food, no other NSAIDs Qty: 30 tablet, Refills: 0   Associated Diagnoses: Sprain and strain    omeprazole (PRILOSEC) 20 MG capsule Take 1 capsule (20 mg total) by mouth daily. Qty: 30 capsule, Refills: 11    polyethylene glycol powder (GLYCOLAX/MIRALAX) powder Take 17 g by mouth 2 (two) times daily as needed. Qty: 255 g, Refills: 2    ranitidine (ZANTAC) 150 MG tablet Take 1 tablet (150 mg total) by mouth 2 (two) times daily. Qty: 60  tablet, Refills: 0   Associated Diagnoses: Gastroesophageal reflux disease without esophagitis    traMADol (ULTRAM) 50 MG tablet TAKE ONE TABLET BY MOUTH EVERY EIGHT HOURS AS NEEDED Qty: 20 tablet, Refills: 0   Associated Diagnoses: Joint pain      STOP taking these medications     ibuprofen (ADVIL,MOTRIN) 200 MG tablet      amoxicillin-clarithromycin-lansoprazole (PREVPAC) combo pack      chlorthalidone (HYGROTON) 25 MG tablet        No Known Allergies Follow-up Information     Follow up with WEAVER, SCOTT D.   Specialty:  Chiropractic Medicine   Why:  office will contact you   Contact information:   [address] [place] [address] [phone]        The results of significant diagnostics from this hospitalization (including imaging, microbiology, ancillary and laboratory) are listed below for reference.    Significant Diagnostic Studies: Dg Chest 2 View  08/21/2015   CLINICAL DATA:  Chest pain  EXAM: CHEST  2 VIEW  COMPARISON:  10/06/2013  FINDINGS: The heart is mildly enlarged. Normal vascularity. Round density in the right upper lung zone is likely related to an overlying object. No consolidation or mass. No pneumothorax or pleural effusion.  IMPRESSION: Cardiomegaly without decompensation.   Electronically Signed   By: Jolaine Click M.D.   On: 08/21/2015 18:31   Nm Myocar Multi W/spect W/wall Motion / Ef  08/22/2015    Blood pressure demonstrated a blunted response to exercise.  There was no ST segment deviation noted during stress.  This is a low risk study.  The left ventricular ejection fraction is hyperdynamic (>65%).   1. Small, mild mid anterior fixed perfusion defect.  Prominent breast  shadow on planar images, suspect artifact. No ischemia. Low risk study.  2. Normal EF and wall motion.     Microbiology: No results found for this or any previous visit (from the past 240 hour(s)).   Labs: Basic Metabolic Panel:  Recent Labs Lab 08/21/15 1808 08/22/15 0442  NA 139  --   K 3.5  --   CL 104  --   CO2 30  --   GLUCOSE 112*  --   BUN 14  --   CREATININE 0.75 0.78  CALCIUM 8.9  --    Liver Function Tests: No results for input(s): AST, ALT, ALKPHOS, BILITOT, PROT, ALBUMIN in the last 168 hours. No results for input(s): LIPASE, AMYLASE in the last 168 hours. No results for input(s): AMMONIA in the last 168 hours. CBC:  Recent Labs Lab 08/21/15 1808 08/22/15 0442  WBC 7.4 6.4  HGB 12.9 12.6  HCT 39.3 38.5  MCV  82.0 82.6  PLT 275 257   Cardiac Enzymes:  Recent Labs Lab 08/22/15 0442 08/22/15 1045 08/22/15 1609  TROPONINI <0.03 <0.03 <0.03       Signed:  Guiselle Mian S  [facility] 08/22/2015, 5:03 PM

## 2015-08-22 NOTE — Progress Notes (Signed)
Myoview normal. Pt can be discharged. We will arrange OV f/u and echo if not done already. Dr Sharl Ma notified via [facility] text message.  Corine Shelter PA-C 08/22/2015 4:44 PM

## 2015-08-22 NOTE — H&P (Signed)
[facility] History and Physical  Pam Hart ZOX:[id number] DOB: 11/23/1960 DOA: 08/21/2015  Referring physician: Roxy Horseman. PCP: No primary care provider on file. [place] [facility]. Specialists: None.  Chief Complaint: Chest pain.  HPI: Pam Hart is a 54 y.o. female history of hypertension has been explained chest pain off and on for last 1 week. Patient's chest pain is mostly retrosternal radiating to left. Patient feels it like a pressure-like. Yesterday it became more persistent. Patient's chest pain is present even at rest. Has some mild shortness of breath during chest pain episodes. Denies any associated productive cough fever chills. In the ER patient was given morphine for the patient's chest pain improved. Patient has been admitted for further management. Presently patient is chest pain-free. Patient otherwise denies any abdominal pain nausea vomiting diarrhea fever chills.   Review of Systems: As presented in the history of presenting illness, rest negative.  Past Medical History  Diagnosis Date  . Arthritis   . Anemia   . Thyroid disease   . Hypertension    Past Surgical History  Procedure Laterality Date  . Appendectomy    . Eye surgery Right 2011    lasix    Social History:  reports that she has never smoked. She has never used smokeless tobacco. She reports that she does not drink alcohol or use illicit drugs. Where does patient live home. Can patient participate in ADLs? Yes.  No Known Allergies  Family History:  Family History  Problem Relation Age of Onset  . Arthritis Maternal Aunt   . Hypertension Mother   . Heart disease Mother   . Alzheimer's disease Father   . Glaucoma Father   . Colon cancer Neg Hx   . Colon polyps Neg Hx   . Esophageal cancer Neg Hx   . Kidney disease Neg Hx   . Gallbladder disease Neg Hx       Prior to Admission medications   Medication Sig Start Date End Date Taking? Authorizing Provider  amLODipine  (NORVASC) 5 MG tablet Take 1 tablet (5 mg total) by mouth daily. 08/18/14  Yes Lanier Clam V, PA-C  ibuprofen (ADVIL,MOTRIN) 200 MG tablet Take 600 mg by mouth every 6 (six) hours as needed for moderate pain.   Yes Historical Provider, MD  methocarbamol (ROBAXIN) 500 MG tablet Take 500 mg by mouth every 6 (six) hours as needed for muscle spasms.  08/16/15  Yes Historical Provider, MD  amoxicillin-clarithromycin-lansoprazole [facility]) combo pack Take by mouth 2 (two) times daily. Follow package directions.  Dispense as generic components Patient not taking: Reported on 08/21/2015 03/27/15   Carmelina Dane, MD  chlorthalidone (HYGROTON) 25 MG tablet Take 1 tablet (25 mg total) by mouth daily. Patient not taking: Reported on 08/21/2015 08/18/14   Dorna Leitz, PA-C  dexlansoprazole (DEXILANT) 60 MG capsule Take 1 capsule (60 mg total) by mouth daily. Patient not taking: Reported on 08/21/2015 03/23/15   Carmelina Dane, MD  meloxicam (MOBIC) 15 MG tablet Take 1 tablet (15 mg total) by mouth daily. Take with food, no other NSAIDs Patient not taking: Reported on 08/21/2015 08/18/14   Dorna Leitz, PA-C  omeprazole (PRILOSEC) 20 MG capsule Take 1 capsule (20 mg total) by mouth daily. Patient not taking: Reported on 08/21/2015 04/12/15   Amy S Esterwood, PA-C  polyethylene glycol powder (GLYCOLAX/MIRALAX) powder Take 17 g by mouth 2 (two) times daily as needed. Patient not taking: Reported on 08/21/2015 04/12/15   Amy S  Esterwood, PA-C  ranitidine (ZANTAC) 150 MG tablet Take 1 tablet (150 mg total) by mouth 2 (two) times daily. Patient not taking: Reported on 08/21/2015 08/18/14   Dorna Leitz, PA-C  traMADol (ULTRAM) 50 MG tablet TAKE ONE TABLET BY MOUTH EVERY EIGHT HOURS AS NEEDED Patient not taking: Reported on 08/21/2015 05/29/15   Dorna Leitz, PA-C    Physical Exam: Filed Vitals:   08/21/15 1759 08/21/15 2106 08/22/15 0013 08/22/15 0127  BP: 179/63 189/81 180/92 174/71  Pulse: 61 60 60 49  Temp: 98  F (36.7 C)   97.8 F (36.6 C)  TempSrc: Oral   Oral  Resp: Height:     (1.549 m)  Weight:    63.821 kg (140 lb 11.2 oz)  SpO2: 100% 100% 100% 100%     General:  Moderately built and nourished.  Eyes: Anicteric no pallor.  ENT: No discharge from the ears eyes nose or mouth.  Neck: No mass felt. No JVD felt.  Cardiovascular: S1-S2 heard.  Respiratory: No rhonchi or crepitations.  Abdomen: Soft nontender bowel sounds present.  Skin: No rash.  Musculoskeletal: No edema.  Psychiatric: Appears normal.  Neurologic: Alert awake oriented to time place and person. Moves all extremities.  Labs on Admission:  Basic Metabolic Panel:  Recent Labs Lab 08/21/15 1808  NA 139  K 3.5  CL 104  CO2 30  GLUCOSE 112*  BUN 14  CREATININE 0.75  CALCIUM 8.9   Liver Function Tests: No results for input(s): AST, ALT, ALKPHOS, BILITOT, PROT, ALBUMIN in the last 168 hours. No results for input(s): LIPASE, AMYLASE in the last 168 hours. No results for input(s): AMMONIA in the last 168 hours. CBC:  Recent Labs Lab 08/21/15 1808  WBC 7.4  HGB 12.9  HCT 39.3  MCV 82.0  PLT 275   Cardiac Enzymes: No results for input(s): CKTOTAL, CKMB, CKMBINDEX, TROPONINI in the last 168 hours.  BNP (last 3 results) No results for input(s): BNP in the last 8760 hours.  ProBNP (last 3 results) No results for input(s): PROBNP in the last 8760 hours.  CBG: No results for input(s): GLUCAP in the last 168 hours.  Radiological Exams on Admission: Dg Chest 2 View  08/21/2015   CLINICAL DATA:  Chest pain  EXAM: CHEST  2 VIEW  COMPARISON:  10/06/2013  FINDINGS: The heart is mildly enlarged. Normal vascularity. Round density in the right upper lung zone is likely related to an overlying object. No consolidation or mass. No pneumothorax or pleural effusion.  IMPRESSION: Cardiomegaly without decompensation.   Electronically Signed   By: Jolaine Click M.D.   On: 08/21/2015 18:31     EKG: Independently reviewed. Normal sinus rhythm.  Assessment/Plan Principal Problem:   Chest pain Active Problems:   Hypertension, uncontrolled   1. Chest pain - appears atypical. Patient blood pressures also uncontrolled at this time. We will cycle cardiac markers checked today, keep patient nothing by mouth in anticipation of possible procedure. Check d-dimer. Aspirin. When necessary nitroglycerin. 2. Hypertension uncontrolled - continue home medications and I have placed patient on when necessary IV hydralazine. Closely follow blood pressure trends. 3. History of GERD on PPI.  I have reviewed patient's old charts and labs. Personally reviewed patient's chest x-ray and EKG.   DVT Prophylaxis Lovenox.  Code Status: Full code.  Family Communication: Discussed with patient.  Disposition Plan: Admit for observation.    Islam Eichinger N. [facility] Pager [phone].  If  7PM-7AM, please contact night-coverage [email or web address] Password [email or web address] 08/22/2015, 2:48 AM

## 2015-08-22 NOTE — Consult Note (Signed)
Reason for Consult:   Chest pain  Requesting Physician: [facility] Primary Cardiologist New- (saw Tereso Newcomer in 2010)  HPI:   54 y/o [place] female we are asked to se fort chest pain. Pt was seen by Tereso Newcomer in 2010 for HTN. No history of CAD, MI, or chest pain. No prior ischemic evaluation. Pt says she ran out of her Norvasc a few days ago. She presented to the ED at [facility] early this am with complaints of chest pain. Patient's chest pain is mostly retrosternal radiating to left. Patient feels it like a pressure-like. Yesterday it became more persistent. Patient's chest pain is present even at rest. Has some mild shortness of breath during chest pain episodes. Denies any associated productive cough fever chills. In the ER patient was given morphine for the patient's chest pain improved. She says her symptoms are improved now but still present.   PMHx:  Past Medical History  Diagnosis Date  . Arthritis   . Anemia   . Thyroid disease   . Hypertension     Past Surgical History  Procedure Laterality Date  . Appendectomy    . Eye surgery Right 2011    lasix     SOCHx:  reports that she has never smoked. She has never used smokeless tobacco. She reports that she does not drink alcohol or use illicit drugs.  FAMHx: Family History  Problem Relation Age of Onset  . Arthritis Maternal Aunt   . Hypertension Mother   . Heart disease Mother   . Alzheimer's disease Father   . Glaucoma Father   . Colon cancer Neg Hx   . Colon polyps Neg Hx   . Esophageal cancer Neg Hx   . Kidney disease Neg Hx   . Gallbladder disease Neg Hx     ALLERGIES: No Known Allergies  ROS: Review of Systems: General: negative for chills, fever, night sweats or weight changes.  Cardiovascular: negative for chest pain, dyspnea on exertion, edema, orthopnea, palpitations, paroxysmal nocturnal dyspnea or shortness of breath HEENT: negative for any visual disturbances, blindness,  glaucoma Dermatological: negative for rash Respiratory: negative for cough, hemoptysis, or wheezing Urologic: negative for hematuria or dysuria Abdominal: negative for nausea, vomiting, diarrhea, bright red blood per rectum, melena, or hematemesis Neurologic: negative for visual changes, syncope, or dizziness Musculoskeletal: negative for back pain, joint pain, or swelling Psych: cooperative and appropriate All other systems reviewed and are otherwise negative except as noted above.   HOME MEDICATIONS: Prior to Admission medications   Medication Sig Start Date End Date Taking? Authorizing Provider  amLODipine (NORVASC) 5 MG tablet Take 1 tablet (5 mg total) by mouth daily. 08/18/14  Yes Lanier Clam V, PA-C  ibuprofen (ADVIL,MOTRIN) 200 MG tablet Take 600 mg by mouth every 6 (six) hours as needed for moderate pain.   Yes Historical Provider, MD  methocarbamol (ROBAXIN) 500 MG tablet Take 500 mg by mouth every 6 (six) hours as needed for muscle spasms.  08/16/15  Yes Historical Provider, MD  amoxicillin-clarithromycin-lansoprazole [place]) combo pack Take by mouth 2 (two) times daily. Follow package directions.  Dispense as generic components Patient not taking: Reported on 08/21/2015 03/27/15   Carmelina Dane, MD  chlorthalidone (HYGROTON) 25 MG tablet Take 1 tablet (25 mg total) by mouth daily. Patient not taking: Reported on 08/21/2015 08/18/14   Dorna Leitz, PA-C  dexlansoprazole (DEXILANT) 60 MG capsule Take 1 capsule (60 mg total) by mouth daily. Patient  not taking: Reported on 08/21/2015 03/23/15   Carmelina Dane, MD  meloxicam (MOBIC) 15 MG tablet Take 1 tablet (15 mg total) by mouth daily. Take with food, no other NSAIDs Patient not taking: Reported on 08/21/2015 08/18/14   Dorna Leitz, PA-C  omeprazole (PRILOSEC) 20 MG capsule Take 1 capsule (20 mg total) by mouth daily. Patient not taking: Reported on 08/21/2015 04/12/15   Amy S Esterwood, PA-C  polyethylene glycol powder  (GLYCOLAX/MIRALAX) powder Take 17 g by mouth 2 (two) times daily as needed. Patient not taking: Reported on 08/21/2015 04/12/15   Amy S Esterwood, PA-C  ranitidine (ZANTAC) 150 MG tablet Take 1 tablet (150 mg total) by mouth 2 (two) times daily. Patient not taking: Reported on 08/21/2015 08/18/14   Dorna Leitz, PA-C  traMADol (ULTRAM) 50 MG tablet TAKE ONE TABLET BY MOUTH EVERY EIGHT HOURS AS NEEDED Patient not taking: Reported on 08/21/2015 05/29/15   Dorna Leitz, [facility] MEDICATIONS: I have reviewed the patient's current medications.  VITALS: Blood pressure 129/56, pulse 49, temperature 97.6 F (36.4 C), temperature source Oral, resp. rate 18, height 5\' 1"  (1.549 m), weight 140 lb 11.2 oz (63.821 kg), SpO2 100 %.  PHYSICAL EXAM: General appearance: alert, cooperative and no distress Neck: no carotid bruit and no JVD Lungs: clear to auscultation bilaterally Heart: regular rate and rhythm, S1, S2 normal, no murmur, click, rub or gallop Abdomen: soft, non-tender; bowel sounds normal; no masses,  no organomegaly Extremities: extremities normal, atraumatic, no cyanosis or edema Pulses: 2+ and symmetric Skin: Skin color, texture, turgor normal. No rashes or lesions Neurologic: Grossly normal  LABS: Results for orders placed or performed during the hospital encounter of 08/21/15 (from the past 24 hour(s))  Basic metabolic panel     Status: Abnormal   Collection Time: 08/21/15  6:08 PM  Result Value Ref Range   Sodium 139 135 - 145 mmol/L   Potassium 3.5 3.5 - 5.1 mmol/L   Chloride 104 101 - 111 mmol/L   CO2 30 22 - 32 mmol/L   Glucose, Bld 112 (H) 65 - 99 mg/dL   BUN 14 6 - 20 mg/dL   Creatinine, Ser 1.61 0.44 - 1.00 mg/dL   Calcium 8.9 8.9 - 09.6 mg/dL   GFR calc non Af Amer >60 >60 mL/min   GFR calc Af Amer >60 >60 mL/min   Anion gap 5 5 - 15  CBC     Status: None   Collection Time: 08/21/15  6:08 PM  Result Value Ref Range   WBC 7.4 4.0 - 10.5 K/uL   RBC 4.79 3.87 -  5.11 MIL/uL   Hemoglobin 12.9 12.0 - 15.0 g/dL   HCT 04.5 40.9 - 81.1 %   MCV 82.0 78.0 - 100.0 fL   MCH 26.9 26.0 - 34.0 pg   MCHC 32.8 30.0 - 36.0 g/dL   RDW 91.4 78.2 - 95.6 %   Platelets 275 150 - 400 K/uL  I-stat troponin, ED     Status: None   Collection Time: 08/21/15  6:20 PM  Result Value Ref Range   Troponin i, poc 0.00 0.00 - 0.08 ng/mL   Comment 3          I-Stat Troponin, ED (not at [facility])     Status: None   Collection Time: 08/21/15 10:10 PM  Result Value Ref Range   Troponin i, poc 0.00 0.00 - 0.08 ng/mL   Comment 3  Troponin I (q 6hr x 3)     Status: None   Collection Time: 08/22/15  4:42 AM  Result Value Ref Range   Troponin I <0.03 <0.031 ng/mL  D-dimer, quantitative (not at [facility])     Status: None   Collection Time: 08/22/15  4:42 AM  Result Value Ref Range   D-Dimer, Quant <0.27 0.00 - 0.48 ug/mL-FEU  CBC     Status: None   Collection Time: 08/22/15  4:42 AM  Result Value Ref Range   WBC 6.4 4.0 - 10.5 K/uL   RBC 4.66 3.87 - 5.11 MIL/uL   Hemoglobin 12.6 12.0 - 15.0 g/dL   HCT 16.1 09.6 - 04.5 %   MCV 82.6 78.0 - 100.0 fL   MCH 27.0 26.0 - 34.0 pg   MCHC 32.7 30.0 - 36.0 g/dL   RDW 40.9 81.1 - 91.4 %   Platelets 257 150 - 400 K/uL  Creatinine, serum     Status: None   Collection Time: 08/22/15  4:42 AM  Result Value Ref Range   Creatinine, Ser 0.78 0.44 - 1.00 mg/dL   GFR calc non Af Amer >60 >60 mL/min   GFR calc Af Amer >60 >60 mL/min    EKG: NSR  IMAGING: Dg Chest 2 View  08/21/2015   CLINICAL DATA:  Chest pain  EXAM: CHEST  2 VIEW  COMPARISON:  10/06/2013  FINDINGS: The heart is mildly enlarged. Normal vascularity. Round density in the right upper lung zone is likely related to an overlying object. No consolidation or mass. No pneumothorax or pleural effusion.  IMPRESSION: Cardiomegaly without decompensation.   Electronically Signed   By: Jolaine Click M.D.   On: 08/21/2015 18:31    IMPRESSION: Active Problems:   Chest pain with  moderate risk of acute coronary syndrome   Hypertension, uncontrolled   GERD (gastroesophageal reflux disease)   RECOMMENDATION: Reviewed with Dr Eden Emms. Plan Myoview and echo.  Time Spent Directly with Patient: 40 minutes  Pam Hart [phone] beeper 08/22/2015, 10:32 AM   Patient examined chart reviewed.  Exam remarkable for S4 gallop.  ECG is normal Some atypical features to pain.  R/O  For myovue study today.  BP better with  Rx has been non compliant.  OK to d/c latter today if myovue normal and she can  Get new scripts for BP meds.    Charlton Haws

## 2015-08-22 NOTE — ED Provider Notes (Signed)
CSN: [id number]     Arrival date & time 08/21/15  1747 History   First MD Initiated Contact with Patient 08/21/15 2104     Chief Complaint  Patient presents with  . Chest Pain  . Shortness of Breath     (Consider location/radiation/quality/duration/timing/severity/associated sxs/prior Treatment) HPI Comments: Patient presents to the emergency department with chief complaint of chest pain. She states that the pain started today. She states that radiates to her left shoulder and to her back. She reports associated shortness breath, diaphoresis, and fatigue. She denies any nausea or vomiting. She denies history of cardiac problems. Cardiac risk factors include hypertension. She has not taken anything to alleviate her symptoms. The symptoms are aggravated with exertion.  The history is provided by the patient. No language interpreter was used.    Past Medical History  Diagnosis Date  . Arthritis   . Anemia   . Thyroid disease   . Hypertension    Past Surgical History  Procedure Laterality Date  . Appendectomy    . Eye surgery Right 2011    lasix    Family History  Problem Relation Age of Onset  . Arthritis Maternal Aunt   . Hypertension Mother   . Heart disease Mother   . Alzheimer's disease Father   . Glaucoma Father   . Colon cancer Neg Hx   . Colon polyps Neg Hx   . Esophageal cancer Neg Hx   . Kidney disease Neg Hx   . Gallbladder disease Neg Hx    Social History  Substance Use Topics  . Smoking status: Never Smoker   . Smokeless tobacco: Never Used  . Alcohol Use: No   OB History    No data available     Review of Systems  Constitutional: Negative for fever and chills.  Respiratory: Positive for shortness of breath.   Cardiovascular: Positive for chest pain.  Gastrointestinal: Negative for nausea, vomiting, diarrhea and constipation.  Genitourinary: Negative for dysuria.  All other systems reviewed and are negative.     Allergies  Review of patient's  allergies indicates no known allergies.  Home Medications   Prior to Admission medications   Medication Sig Start Date End Date Taking? Authorizing Provider  amLODipine (NORVASC) 5 MG tablet Take 1 tablet (5 mg total) by mouth daily. 08/18/14  Yes Lanier Clam V, PA-C  ibuprofen (ADVIL,MOTRIN) 200 MG tablet Take 600 mg by mouth every 6 (six) hours as needed for moderate pain.   Yes Historical Provider, MD  methocarbamol (ROBAXIN) 500 MG tablet Take 500 mg by mouth every 6 (six) hours as needed for muscle spasms.  08/16/15  Yes Historical Provider, MD  amoxicillin-clarithromycin-lansoprazole [facility]) combo pack Take by mouth 2 (two) times daily. Follow package directions.  Dispense as generic components Patient not taking: Reported on 08/21/2015 03/27/15   Carmelina Dane, MD  chlorthalidone (HYGROTON) 25 MG tablet Take 1 tablet (25 mg total) by mouth daily. Patient not taking: Reported on 08/21/2015 08/18/14   Dorna Leitz, PA-C  dexlansoprazole (DEXILANT) 60 MG capsule Take 1 capsule (60 mg total) by mouth daily. Patient not taking: Reported on 08/21/2015 03/23/15   Carmelina Dane, MD  meloxicam (MOBIC) 15 MG tablet Take 1 tablet (15 mg total) by mouth daily. Take with food, no other NSAIDs Patient not taking: Reported on 08/21/2015 08/18/14   Dorna Leitz, PA-C  omeprazole (PRILOSEC) 20 MG capsule Take 1 capsule (20 mg total) by mouth daily. Patient not taking: Reported on  08/21/2015 04/12/15   Amy S Esterwood, PA-C  polyethylene glycol powder (GLYCOLAX/MIRALAX) powder Take 17 g by mouth 2 (two) times daily as needed. Patient not taking: Reported on 08/21/2015 04/12/15   Amy S Esterwood, PA-C  ranitidine (ZANTAC) 150 MG tablet Take 1 tablet (150 mg total) by mouth 2 (two) times daily. Patient not taking: Reported on 08/21/2015 08/18/14   Dorna Leitz, PA-C  traMADol (ULTRAM) 50 MG tablet TAKE ONE TABLET BY MOUTH EVERY EIGHT HOURS AS NEEDED Patient not taking: Reported on 08/21/2015 05/29/15   Dorna Leitz, PA-C   BP 189/81 mmHg  Pulse 60  Temp(Src) 98 F (36.7 C) (Oral)  Resp 16  SpO2 100% Physical Exam  Constitutional: She is oriented to person, place, and time. She appears well-developed and well-nourished.  HENT:  Head: Normocephalic and atraumatic.  Eyes: Conjunctivae and EOM are normal. Pupils are equal, round, and reactive to light.  Neck: Normal range of motion. Neck supple.  Cardiovascular: Normal rate and regular rhythm.  Exam reveals no gallop and no friction rub.   No murmur heard. Pulmonary/Chest: Effort normal and breath sounds normal. No respiratory distress. She has no wheezes. She has no rales. She exhibits no tenderness.  Abdominal: Soft. Bowel sounds are normal. She exhibits no distension and no mass. There is no tenderness. There is no rebound and no guarding.  Musculoskeletal: Normal range of motion. She exhibits no edema or tenderness.  Neurological: She is alert and oriented to person, place, and time.  Skin: Skin is warm and dry.  Psychiatric: She has a normal mood and affect. Her behavior is normal. Judgment and thought content normal.  Nursing note and vitals reviewed.   ED Course  Procedures (including critical care time) Labs Review Labs Reviewed  BASIC METABOLIC PANEL - Abnormal; Notable for the following:    Glucose, Bld 112 (*)    All other components within normal limits  CBC  I-STAT TROPOININ, ED  Rosezena Sensor, ED    Imaging Review Dg Chest 2 View  08/21/2015   CLINICAL DATA:  Chest pain  EXAM: CHEST  2 VIEW  COMPARISON:  10/06/2013  FINDINGS: The heart is mildly enlarged. Normal vascularity. Round density in the right upper lung zone is likely related to an overlying object. No consolidation or mass. No pneumothorax or pleural effusion.  IMPRESSION: Cardiomegaly without decompensation.   Electronically Signed   By: Jolaine Click M.D.   On: 08/21/2015 18:31   I have personally reviewed and evaluated these images and lab results as part  of my medical decision-making.   EKG Interpretation   Date/Time:  Tuesday August 21 2015 18:08:16 EDT Ventricular Rate:  60 PR Interval:  156 QRS Duration: 79 QT Interval:  416 QTC Calculation: 416 R Axis:   40 Text Interpretation:  Sinus rhythm Probable left atrial enlargement  Confirmed by ALLEN  MD, ANTHONY ([id number]) on 08/21/2015 10:18:01 PM      MDM   Final diagnoses:  Chest pain, unspecified chest pain type    Patient with chest pain. Associated symptoms include shortness breath, exertional pain/SOB, diaphoresis, and radiating symptoms. Heart score is 4. Patient discussed with Dr. Freida Busman, who recommends observation admission for chest pain rule out. Patient given morphine and aspirin in the emergency department.      Roxy Horseman, PA-C 08/22/15 0004  Lorre Nick, MD 08/23/15 [phone]

## 2015-08-27 ENCOUNTER — Other Ambulatory Visit (HOSPITAL_COMMUNITY): Payer: BLUE CROSS/BLUE SHIELD

## 2015-08-31 ENCOUNTER — Other Ambulatory Visit: Payer: Self-pay

## 2015-08-31 ENCOUNTER — Ambulatory Visit (HOSPITAL_COMMUNITY): Payer: BLUE CROSS/BLUE SHIELD | Attending: Internal Medicine

## 2015-08-31 DIAGNOSIS — E785 Hyperlipidemia, unspecified: Secondary | ICD-10-CM | POA: Insufficient documentation

## 2015-08-31 DIAGNOSIS — R079 Chest pain, unspecified: Secondary | ICD-10-CM | POA: Diagnosis not present

## 2015-08-31 DIAGNOSIS — I10 Essential (primary) hypertension: Secondary | ICD-10-CM

## 2015-08-31 DIAGNOSIS — I517 Cardiomegaly: Secondary | ICD-10-CM | POA: Diagnosis not present

## 2015-09-03 ENCOUNTER — Ambulatory Visit (INDEPENDENT_AMBULATORY_CARE_PROVIDER_SITE_OTHER): Payer: BLUE CROSS/BLUE SHIELD | Admitting: Nurse Practitioner

## 2015-09-03 ENCOUNTER — Encounter: Payer: Self-pay | Admitting: Nurse Practitioner

## 2015-09-03 VITALS — BP 130/80 | HR 70 | Ht 61.0 in | Wt 143.6 lb

## 2015-09-03 DIAGNOSIS — R0789 Other chest pain: Secondary | ICD-10-CM

## 2015-09-03 DIAGNOSIS — I10 Essential (primary) hypertension: Secondary | ICD-10-CM

## 2015-09-03 DIAGNOSIS — R5383 Other fatigue: Secondary | ICD-10-CM

## 2015-09-03 LAB — BASIC METABOLIC PANEL
BUN: 10 mg/dL (ref 7–25)
CO2: 28 mmol/L (ref 20–31)
Calcium: 8.9 mg/dL (ref 8.6–10.4)
Chloride: 102 mmol/L (ref 98–110)
Creat: 0.81 mg/dL (ref 0.50–1.05)
Glucose, Bld: 112 mg/dL — ABNORMAL HIGH (ref 65–99)
Potassium: 3.5 mmol/L (ref 3.5–5.3)
Sodium: 143 mmol/L (ref 135–146)

## 2015-09-03 LAB — TSH: TSH: 1.461 u[IU]/mL (ref 0.350–4.500)

## 2015-09-03 NOTE — Patient Instructions (Addendum)
We will be checking the following labs today - BMET and TSH  Medication Instructions:    Continue with your current medicines.     Testing/Procedures To Be Arranged:  N/A  Follow-Up:   See Dr. Eden EmmsNishan back as needed.     Other Special Instructions:   N/A  Call the [facility] office at [phone] if you have any questions, problems or concerns.

## 2015-09-03 NOTE — Progress Notes (Signed)
CARDIOLOGY OFFICE NOTE  Date:  09/03/2015    Pam Hart Date of Birth: [birth year] Medical Record #[id number]  PCP:  No PCP Per Patient  Cardiologist:  Eden Emms    Chief Complaint  Patient presents with  . Chest Pain    Post hospital visit - seen for Dr. Eden Emms.     History of Present Illness: Pam Hart is a 54 y.o. female who presents today for a post hospital visit. Seen for Dr. Eden Emms.   She is from [place]. Recently at [facility] with atypical chest pain. BP not controlled - had been off of her medicines. Had negative Myoview. Was to follow up for echocardiogram. HCTZ added to her regimen.   Comes in today. Here alone. She says she continue to have this "muscle pain all over". She is taking a variety of medicines. Several GI meds - says she takes different ones at different times. Some palpable chest wall pain. BP has improved. Followed at Urgent Care out at [facility]. She is "so tired". No regular exercise. Review of her labs were ok - no recent TSH.    Past Medical History  Diagnosis Date  . Arthritis   . Anemia   . Thyroid disease   . Hypertension     Past Surgical History  Procedure Laterality Date  . Appendectomy    . Eye surgery Right 2011    lasix      Medications: Current Outpatient Prescriptions  Medication Sig Dispense Refill  . amLODipine (NORVASC) 5 MG tablet Take 1 tablet (5 mg total) by mouth daily. 30 tablet 3  . dexlansoprazole (DEXILANT) 60 MG capsule Take 1 capsule (60 mg total) by mouth daily. 30 capsule 5  . hydrochlorothiazide (HYDRODIURIL) 12.5 MG tablet Take 1 tablet (12.5 mg total) by mouth daily. 30 tablet 3  . meloxicam (MOBIC) 15 MG tablet Take 1 tablet (15 mg total) by mouth daily. Take with food, no other NSAIDs 30 tablet 0  . methocarbamol (ROBAXIN) 500 MG tablet Take 500 mg by mouth every 6 (six) hours as needed for muscle spasms.     Marland Kitchen omeprazole (PRILOSEC) 20 MG capsule Take 1 capsule (20 mg total) by mouth daily. 30 capsule 11   . traMADol (ULTRAM) 50 MG tablet TAKE ONE TABLET BY MOUTH EVERY EIGHT HOURS AS NEEDED 20 tablet 0  . ranitidine (ZANTAC) 150 MG tablet Take 1 tablet (150 mg total) by mouth 2 (two) times daily. (Patient not taking: Reported on 08/21/2015) 60 tablet 0   No current facility-administered medications for this visit.    Allergies: No Known Allergies  Social History: The patient  reports that she has never smoked. She has never used smokeless tobacco. She reports that she does not drink alcohol or use illicit drugs.   Family History: The patient's family history includes Alzheimer's disease in her father; Arthritis in her maternal aunt; Glaucoma in her father; Heart disease in her mother; Hypertension in her mother. There is no history of Colon cancer, Colon polyps, Esophageal cancer, Kidney disease, or Gallbladder disease.   Review of Systems: Please see the history of present illness.   Otherwise, the review of systems is positive for none.   All other systems are reviewed and negative.   Physical Exam: VS:  BP 130/80 mmHg  Pulse 70  Ht  (1.549 m)  Wt 143 lb 9.6 oz (65.137 kg)  BMI 27.15 kg/m2  SpO2 99% .  BMI Body mass index is 27.15 kg/(m^2).  Wt Readings from Last 3 Encounters:  09/03/15 143 lb 9.6 oz (65.137 kg)  08/22/15 140 lb 11.2 oz (63.821 kg)  04/12/15 148 lb 2 oz (67.189 kg)    General: Pleasant. Alert. She is in no acute distress.  HEENT: Normal. Neck: Supple, no JVD, carotid bruits, or masses noted.  Cardiac: Regular rate and rhythm. No murmurs, rubs, or gallops. No edema.  Respiratory:  Lungs are clear to auscultation bilaterally with normal work of breathing.  GI: Soft and nontender.  MS: No deformity or atrophy. Gait and ROM intact. Skin: Warm and dry. Color is normal.  Neuro:  Strength and sensation are intact and no gross focal deficits noted.  Psych: Alert, appropriate and with normal affect.   LABORATORY DATA:  EKG:  EKG is not ordered today.  Lab  Results  Component Value Date   WBC 6.4 08/22/2015   HGB 12.6 08/22/2015   HCT 38.5 08/22/2015   PLT 257 08/22/2015   GLUCOSE 112* 08/21/2015   CHOL 216* 08/18/2014   TRIG 212* 08/18/2014   HDL 57 08/18/2014   LDLCALC 117* 08/18/2014   ALT 22 02/21/2015   AST 20 02/21/2015   NA 139 08/21/2015   K 3.5 08/21/2015   CL 104 08/21/2015   CREATININE 0.78 08/22/2015   BUN 14 08/21/2015   CO2 30 08/21/2015   TSH 2.084 08/18/2014   HGBA1C 5.5 02/21/2015    BNP (last 3 results) No results for input(s): BNP in the last 8760 hours.  ProBNP (last 3 results) No results for input(s): PROBNP in the last 8760 hours.   Other Studies Reviewed Today:  Echo Study Conclusions from 08/2013  - Left ventricle: The cavity size was normal. Systolic function was normal. The estimated ejection fraction was in the range of 60% to 65%. Doppler parameters are consistent with abnormal left ventricular relaxation (grade 1 diastolic dysfunction). - Pulmonary arteries: PA peak pressure: 35 mm Hg (S).   Myoview Study Result from 08/2015      Blood pressure demonstrated a blunted response to exercise.  There was no ST segment deviation noted during stress.  This is a low risk study.  The left ventricular ejection fraction is hyperdynamic (>65%).  1. Small, mild mid anterior fixed perfusion defect. Prominent breast shadow on planar images, suspect artifact. No ischemia. Low risk study.  2. Normal EF and wall motion.     Assessment/Plan: 1. Atypical chest pain - seems more musculoskeletal in origin. Encouraged her to follow back up with Urgent Care/Primary Care. Her cardiac studies were ok - do not feel that further testing is warranted at this time. Seems a little depressed to me today but this was not explored. Will see back prn.   2. HTN - BP has improved. Needs repeat BMET.   3. GERD - on PPI therapy.   4. Fatigue - seems chronic. Will check TSH today.   Current medicines are  reviewed with the patient today.  The patient does not have concerns regarding medicines other than what has been noted above.  The following changes have been made:  See above.  Labs/ tests ordered today include:    Orders Placed This Encounter  Procedures  . Basic metabolic panel  . TSH     Disposition:   FU here prn.  Would defer further testing to Urgent Care/Primary Care.   Patient is agreeable to this plan and will call if any problems develop in the interim.   Signed: Rosalio MacadamiaLori C. Ramona Ruark, RN, ANP-C  09/03/2015 3:29 PM  [facility] [address] [place], [place]  [address] Phone: [phone] Fax: [phone]

## 2015-09-04 ENCOUNTER — Telehealth: Payer: Self-pay | Admitting: Nurse Practitioner

## 2015-09-04 ENCOUNTER — Other Ambulatory Visit (HOSPITAL_COMMUNITY): Payer: BLUE CROSS/BLUE SHIELD

## 2015-09-04 NOTE — Telephone Encounter (Signed)
New Message  Pt req a call back to determine if the lab results are in. Please call

## 2015-10-17 IMAGING — US US ABDOMEN COMPLETE
1 series · 14 of 25 positions shown · non-contrast
Comparison: September 09, 2007

CLINICAL DATA: Epigastric and upper abdominal pain for 3 month

EXAM:
ULTRASOUND ABDOMEN COMPLETE

[Series 1: us abdomen complete · 0.18mm/px · 14 of 84 slices shown]
[im 1/84]
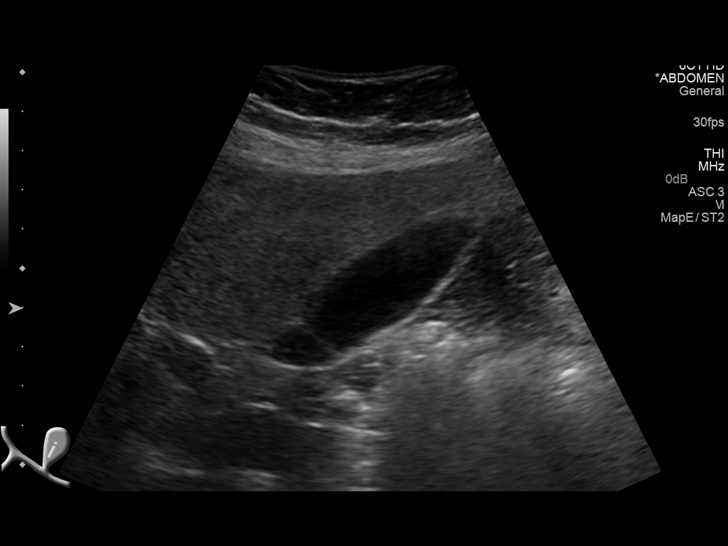
[im 7/84]
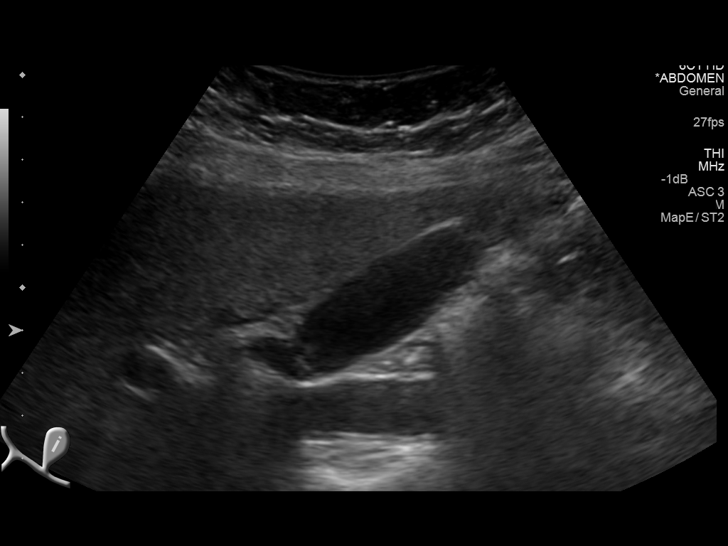
[im 14/84]
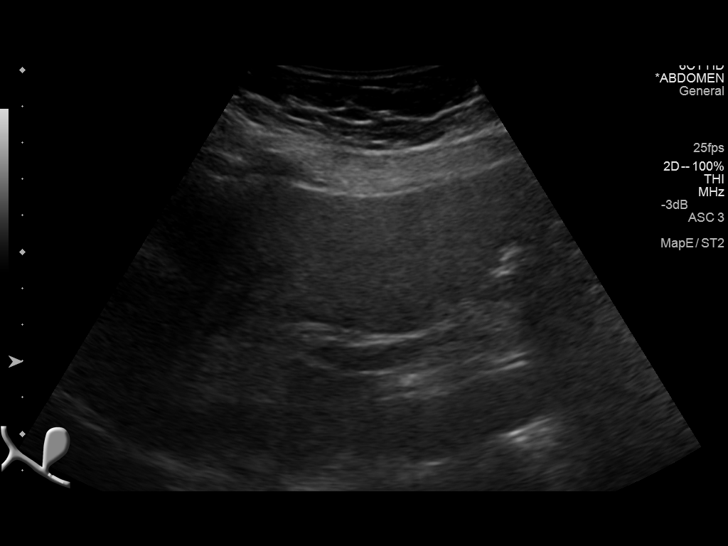
[im 21/84]
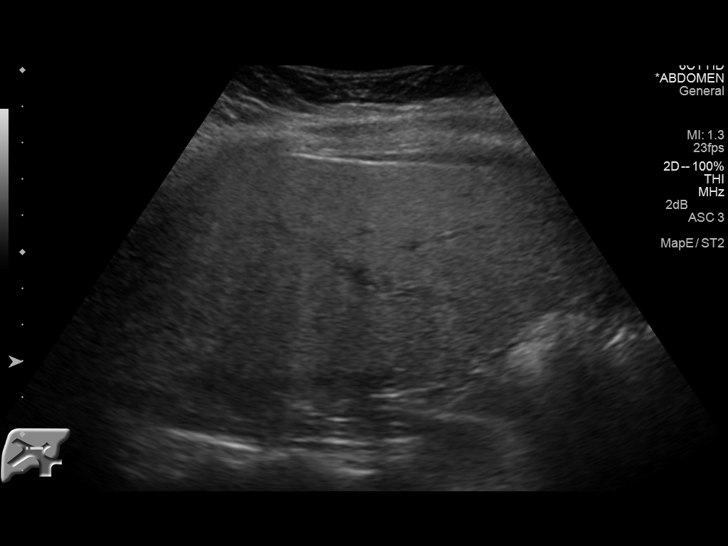
[im 28/84]
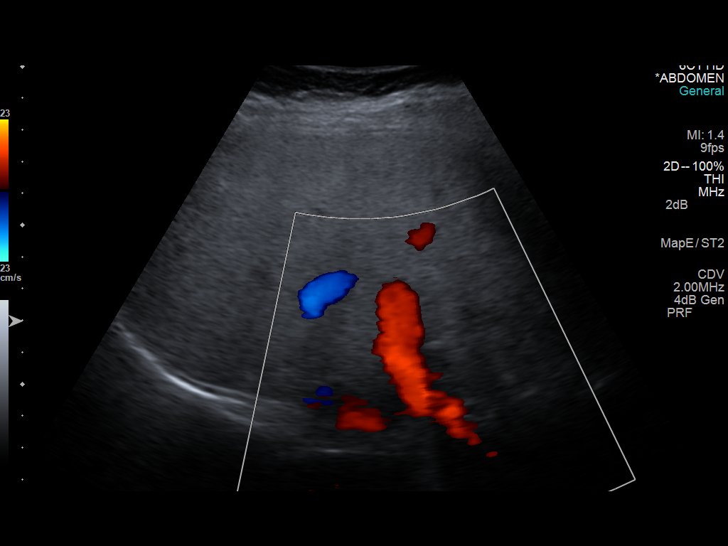
[im 32/84]
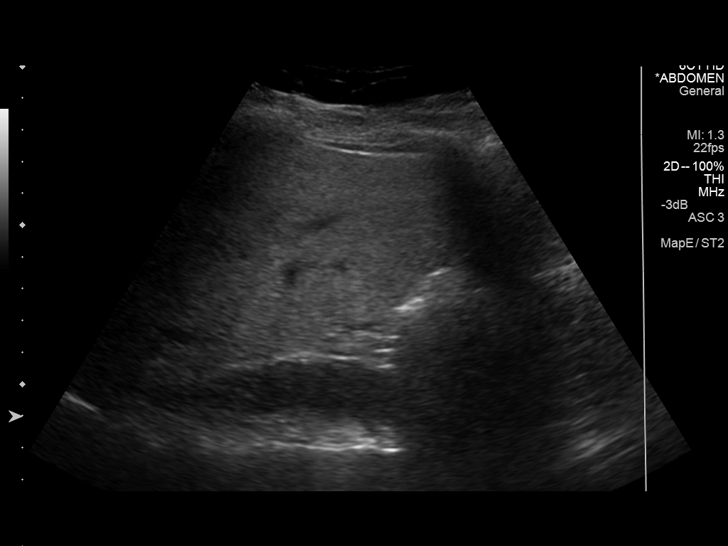
[im 39/84]
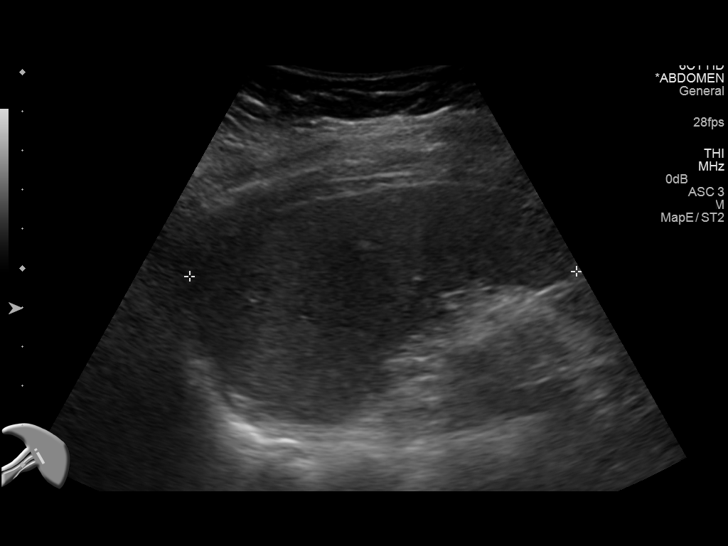
[im 45/84]
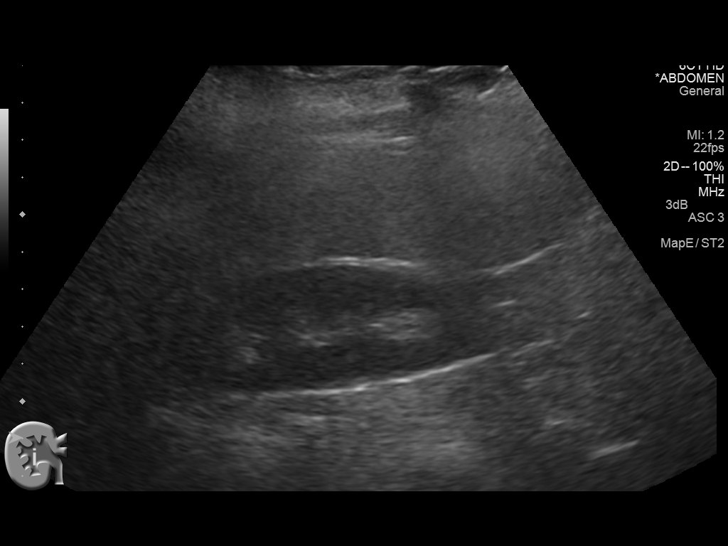
[im 52/84]
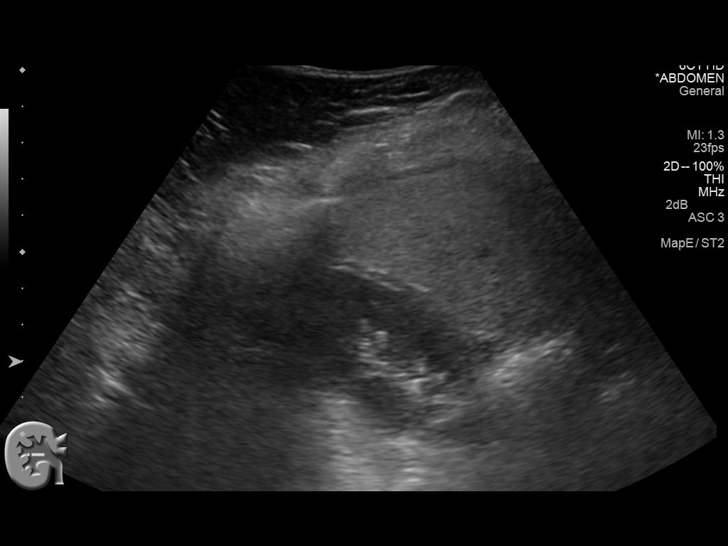
[im 56/84]
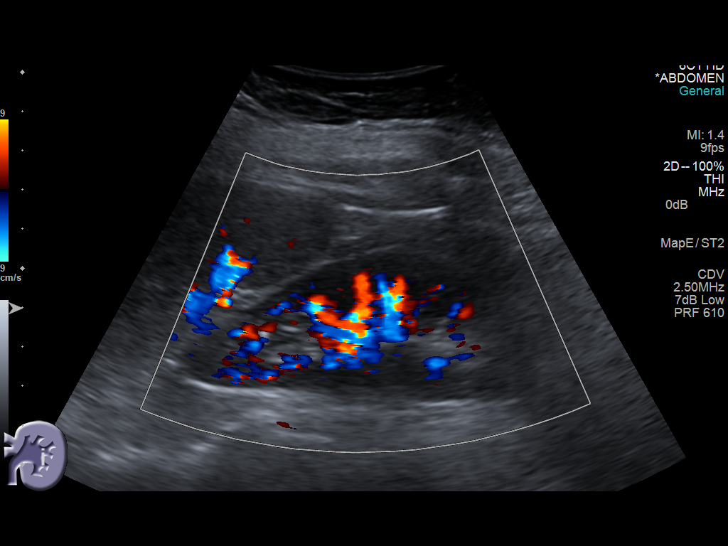
[im 63/84]
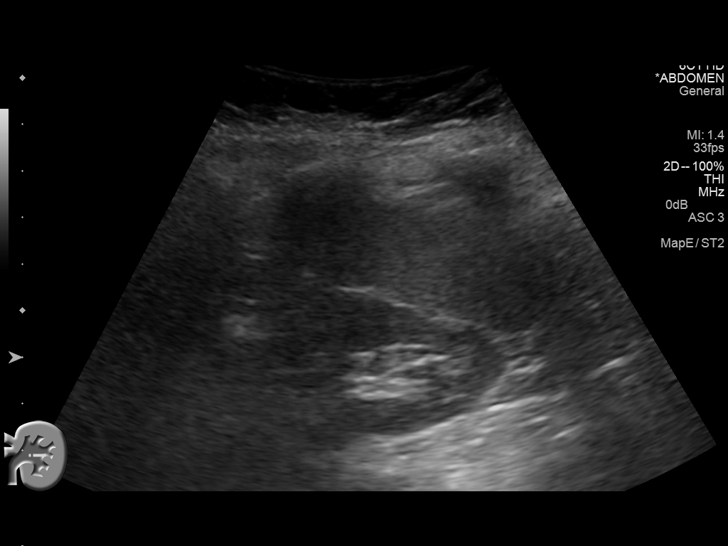
[im 70/84]
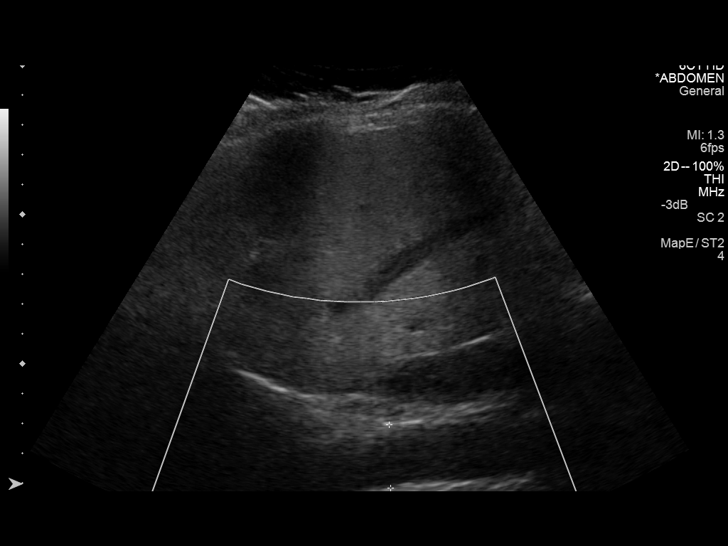
[im 77/84]
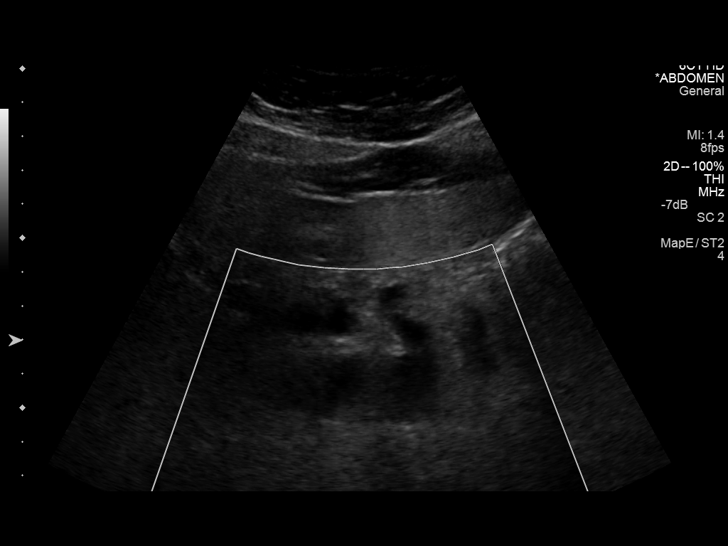
[im 84/84]
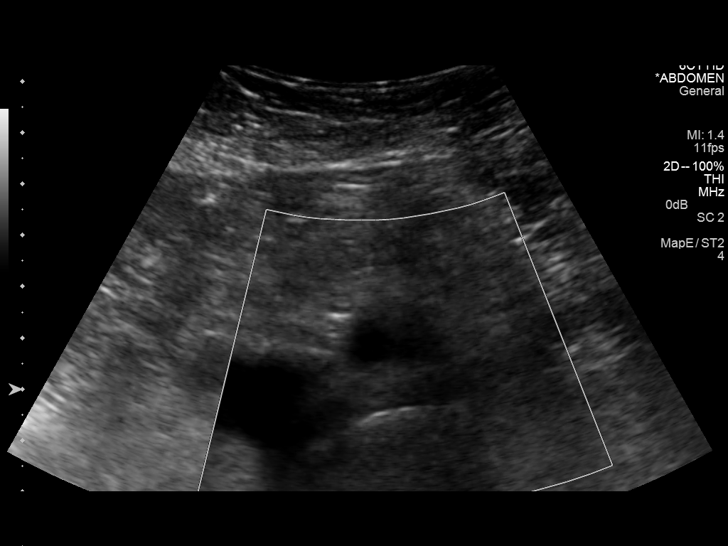

[14 of 25 positions shown; findings below may reference images not displayed]

FINDINGS: Gallbladder: No gallstones or wall thickening visualized. There is
no pericholecystic fluid. No sonographic Murphy sign noted.

Common bile duct: Diameter: 3 mm. There is no intrahepatic, common
hepatic, or common bile duct dilatation.

Liver: No focal lesion identified. Liver echogenicity is diffusely
increased

IVC: No abnormality visualized.

Pancreas: Vis no mass or inflammatory focus.

Spleen: Size and appearance within normal limits.

Right Kidney: Length: 10.0 cm. Echogenicity within normal limits. No
mass or hydronephrosis visualized.

Left Kidney: Length: 9.8 cm. Echogenicity within normal limits. No
mass or hydronephrosis visualized.

Abdominal aorta: No aneurysm visualized.

Other findings: No demonstrable ascites.
IMPRESSION: Diffuse increased liver echotexture, probably due to hepatic
steatosis. While no focal liver lesions are identified, it must be
cautioned that the sensitivity of ultrasound for focal liver lesions
is diminished significantly in this circumstance. Study otherwise
unremarkable.

## 2015-10-24 ENCOUNTER — Ambulatory Visit (INDEPENDENT_AMBULATORY_CARE_PROVIDER_SITE_OTHER): Payer: BLUE CROSS/BLUE SHIELD | Admitting: Physician Assistant

## 2015-10-24 ENCOUNTER — Encounter: Payer: Self-pay | Admitting: Physician Assistant

## 2015-10-24 VITALS — BP 140/80 | HR 64 | Temp 98.3°F | Resp 16 | Ht 60.75 in | Wt 141.6 lb

## 2015-10-24 DIAGNOSIS — Z111 Encounter for screening for respiratory tuberculosis: Secondary | ICD-10-CM | POA: Diagnosis not present

## 2015-10-24 DIAGNOSIS — K219 Gastro-esophageal reflux disease without esophagitis: Secondary | ICD-10-CM | POA: Diagnosis not present

## 2015-10-24 DIAGNOSIS — Z13220 Encounter for screening for lipoid disorders: Secondary | ICD-10-CM

## 2015-10-24 DIAGNOSIS — Z23 Encounter for immunization: Secondary | ICD-10-CM | POA: Diagnosis not present

## 2015-10-24 DIAGNOSIS — M2550 Pain in unspecified joint: Secondary | ICD-10-CM | POA: Diagnosis not present

## 2015-10-24 DIAGNOSIS — Z0000 Encounter for general adult medical examination without abnormal findings: Secondary | ICD-10-CM

## 2015-10-24 DIAGNOSIS — Z1239 Encounter for other screening for malignant neoplasm of breast: Secondary | ICD-10-CM

## 2015-10-24 DIAGNOSIS — Z131 Encounter for screening for diabetes mellitus: Secondary | ICD-10-CM

## 2015-10-24 DIAGNOSIS — M542 Cervicalgia: Secondary | ICD-10-CM | POA: Diagnosis not present

## 2015-10-24 DIAGNOSIS — I10 Essential (primary) hypertension: Secondary | ICD-10-CM

## 2015-10-24 LAB — POCT URINALYSIS DIP (MANUAL ENTRY)
BILIRUBIN UA: NEGATIVE
GLUCOSE UA: NEGATIVE
Ketones, POC UA: NEGATIVE
NITRITE UA: NEGATIVE
PH UA: 6
Protein Ur, POC: NEGATIVE
Spec Grav, UA: 1.02
UROBILINOGEN UA: 0.2

## 2015-10-24 LAB — CBC
HEMATOCRIT: 41.7 % (ref 36.0–46.0)
HEMOGLOBIN: 13.7 g/dL (ref 12.0–15.0)
MCH: 26.6 pg (ref 26.0–34.0)
MCHC: 32.9 g/dL (ref 30.0–36.0)
MCV: 81 fL (ref 78.0–100.0)
MPV: 10.4 fL (ref 8.6–12.4)
Platelets: 314 10*3/uL (ref 150–400)
RBC: 5.15 MIL/uL — ABNORMAL HIGH (ref 3.87–5.11)
RDW: 14 % (ref 11.5–15.5)
WBC: 7.6 10*3/uL (ref 4.0–10.5)

## 2015-10-24 LAB — COMPREHENSIVE METABOLIC PANEL
ALBUMIN: 4.8 g/dL (ref 3.6–5.1)
ALT: 18 U/L (ref 6–29)
AST: 19 U/L (ref 10–35)
Alkaline Phosphatase: 79 U/L (ref 33–130)
BILIRUBIN TOTAL: 0.4 mg/dL (ref 0.2–1.2)
BUN: 8 mg/dL (ref 7–25)
CALCIUM: 9.8 mg/dL (ref 8.6–10.4)
CO2: 28 mmol/L (ref 20–31)
Chloride: 100 mmol/L (ref 98–110)
Creat: 0.69 mg/dL (ref 0.50–1.05)
GLUCOSE: 87 mg/dL (ref 65–99)
POTASSIUM: 3.5 mmol/L (ref 3.5–5.3)
Sodium: 138 mmol/L (ref 135–146)
Total Protein: 7.7 g/dL (ref 6.1–8.1)

## 2015-10-24 LAB — LIPID PANEL
CHOL/HDL RATIO: 3.5 ratio (ref <=5.0)
Cholesterol: 226 mg/dL — ABNORMAL HIGH (ref 125–200)
HDL: 64 mg/dL (ref >=46)
LDL CALC: 135 mg/dL — AB (ref <130)
TRIGLYCERIDES: 137 mg/dL (ref <150)
VLDL: 27 mg/dL (ref <30)

## 2015-10-24 MED ORDER — METHOCARBAMOL 500 MG PO TABS: 500.0000 mg | ORAL_TABLET | Freq: Four times a day (QID) | ORAL | Status: DC | PRN

## 2015-10-24 MED ORDER — TRAMADOL HCL 50 MG PO TABS: 50.0000 mg | ORAL_TABLET | Freq: Three times a day (TID) | ORAL | Status: DC | PRN

## 2015-10-24 MED ORDER — DEXLANSOPRAZOLE 60 MG PO CPDR: 60.0000 mg | DELAYED_RELEASE_CAPSULE | Freq: Every day | ORAL | Status: DC

## 2015-10-24 MED ORDER — LISINOPRIL 10 MG PO TABS: 10.0000 mg | ORAL_TABLET | Freq: Every day | ORAL | Status: DC

## 2015-10-24 MED ORDER — AMLODIPINE BESYLATE 5 MG PO TABS: 5.0000 mg | ORAL_TABLET | Freq: Every day | ORAL | Status: DC

## 2015-10-24 MED ORDER — MELOXICAM 15 MG PO TABS: 15.0000 mg | ORAL_TABLET | Freq: Every day | ORAL | Status: DC

## 2015-10-24 NOTE — Progress Notes (Signed)
[facility] [address] [place] [address] [phone]  Date:  10/24/2015   Name:  Pam Hart   DOB:  July 19, 1961   MRN:  [id number]  PCP:  No PCP Per Patient    Chief Complaint: Annual Exam; Medication Refill; and Immunizations   History of Present Illness:  This is a 54 y.o. female with PMH HTN, HLD, GERD, neck pain who is presenting for CPE.  HTN: uncontrolled. She is rx'd amlodipine 5 mg QD and hctz 12.5 mg QD. She takes amlodipine daily but admits she doesn't take hctz consistently. She states it makes her urinate too much and she doesn't like this. If she takes it, she takes at 2 pm so that she isn't urinating during the day at work, but then she gets up during the night to urinate. She presented to ED on 08/22/15 with chest pain and elevated bp. EKG without ischemic changes. Nuclear myoview normal. She was discharged with instruction to f/u with cards outpatient. At outpatient f/u, cp seemed to be more MSK in nature and no further studies ordered. She is stating today that cp has recurred a few times but not as bad, usually in mornings. She takes her BP at home and SB usually 140-150s. Infrequently under 140.  Neck pain - pt has been complaining of neck pain x >5 years. Radiographs show mild degenerative changes with most recent radiograph in 2014. MRI in 2014 showed mild disc bulging at c3-4 and c4-5. Also some spurring at c5-6 without stenosis. She takes mobic 15 mg daily. She takes tramadol and robaxin prn - each 1-4 times a week depending.  GERD - pt has been out of dexilant 60 mg so she has been taking omeprazole 20 mg prn. She takes 2 times a week prn. She states the dexilant works much better than the omeprazole.  Pt needing labs/immunizations for work.  LMP: postmenopausal Last pap: last pap at CPE 08/2014 and negative Sexual history: yes with husband, doesn't want any STD testing today Immunizations: tdap 01/2015, flu today Dentist: regular  visits Eye: no vision changes Diet/Exercise: eats healthy, isn't exercising regularly currently. Usually walks in mornings in warmer months, hasn't been walking bc too cold Fam hx: mother with htn and cad, sister with DM. No breast cancer, colon cancer Tobacco/alcohol/substance use: no/no/no Mammogram: 08/2014, wants referral Colonoscopy: never. Does not want currently. Doesn't want to do office or home hemoccult cards either. 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 No problems with urine/BM  Review of Systems:  Review of Systems  Constitutional: Negative.   HENT: Negative.   Eyes: Negative.   Respiratory: Negative.   Cardiovascular: Positive for chest pain. Negative for palpitations and leg swelling.  Gastrointestinal: Negative.   Endocrine: Negative.   Genitourinary: Negative.   Musculoskeletal: Positive for arthralgias and neck pain.  Skin: Negative.   Allergic/Immunologic: Negative.   Neurological: Negative.   Hematological: Negative.   Psychiatric/Behavioral: Negative.     Patient Active Problem List   Diagnosis Date Noted  . Hypertension, uncontrolled 08/22/2015  . Chest pain with moderate risk of acute coronary syndrome 08/22/2015  . GERD (gastroesophageal reflux disease) 08/22/2015  . Hyperlipidemia 03/17/2013  . FATIGUE 08/05/2007    Prior to Admission medications   Medication Sig Start Date End Date Taking? Authorizing Provider  amLODipine (NORVASC) 5 MG tablet Take 1 tablet (5 mg total) by mouth daily.  08/22/15  Yes Meredeth Ide, MD  dexlansoprazole (DEXILANT) 60 MG capsule Take 1 capsule (60 mg total) by mouth daily. 03/23/15  Yes Carmelina Dane, MD  hydrochlorothiazide (HYDRODIURIL) 12.5 MG tablet Take 1 tablet (12.5 mg total) by mouth daily. 08/22/15  Yes Meredeth Ide, MD  meloxicam (MOBIC) 15 MG tablet Take 1 tablet (15 mg total) by mouth daily. Take with food, no other NSAIDs  08/18/14  Yes Lanier Clam V, PA-C  methocarbamol (ROBAXIN) 500 MG tablet Take 500 mg by mouth every 6 (six) hours as needed for muscle spasms.  08/16/15  Yes Historical Provider, MD  omeprazole (PRILOSEC) 20 MG capsule Take 1 capsule (20 mg total) by mouth daily. 04/12/15  Yes Amy S Esterwood, PA-C  traMADol (ULTRAM) 50 MG tablet TAKE ONE TABLET BY MOUTH EVERY EIGHT HOURS AS NEEDED 05/29/15  Yes Dorna Leitz, PA-C           No Known Allergies  Past Surgical History  Procedure Laterality Date  . Appendectomy    . Eye surgery Right 2011    lasix     Social History  Substance Use Topics  . Smoking status: Never Smoker   . Smokeless tobacco: Never Used  . Alcohol Use: No    Family History  Problem Relation Age of Onset  . Arthritis Maternal Aunt   . Hypertension Mother   . Heart disease Mother   . Alzheimer's disease Father   . Glaucoma Father   . Colon cancer Neg Hx   . Colon polyps Neg Hx   . Esophageal cancer Neg Hx   . Kidney disease Neg Hx   . Gallbladder disease Neg Hx   . Diabetes Sister   . Hypertension Sister     Medication list has been reviewed and updated.  Physical Examination:  Physical Exam  Constitutional: She is oriented to person, place, and time.  HENT:  Head: Normocephalic and atraumatic.  Right Ear: Hearing, tympanic membrane, external ear and ear canal normal.  Left Ear: Hearing, tympanic membrane, external ear and ear canal normal.  Nose: Nose normal.  Mouth/Throat: Uvula is midline, oropharynx is clear and moist and mucous membranes are normal.  Eyes: Conjunctivae, EOM and lids are normal. Right eye exhibits no discharge. Left eye exhibits no discharge. No scleral icterus.  Neck: Trachea normal. Carotid bruit is not present. No thyromegaly present.  Cardiovascular: Normal rate, regular rhythm, normal heart sounds, intact distal pulses and normal pulses.   No murmur heard. Pulmonary/Chest: Effort normal and breath sounds normal. She has no  wheezes. She has no rhonchi. She has no rales.  Pt declined, wants mammogram only  Abdominal: Soft. Normal appearance and bowel sounds are normal. She exhibits no abdominal bruit. There is no tenderness.  Musculoskeletal: Normal range of motion.  Lymphadenopathy:       Head (right side): No submental, no submandibular, no tonsillar, no preauricular, no posterior auricular and no occipital adenopathy present.       Head (left side): No submental, no submandibular, no tonsillar, no preauricular, no posterior auricular and no occipital adenopathy present.    She has no cervical adenopathy.       Right: No supraclavicular adenopathy present.       Left: No supraclavicular adenopathy present.  Neurological: She is alert and oriented to person, place, and time. She has normal strength and normal reflexes. No cranial nerve deficit or sensory deficit. Coordination and gait normal.  Skin: Skin is warm, dry and intact.  No lesion and no rash noted.  Psychiatric: She has a normal mood and affect. Her speech is normal and behavior is normal. Thought content normal.   BP 140/80 mmHg  Pulse 64  Temp(Src) 98.3 F (36.8 C) (Oral)  Resp 16  Ht 5' 0.75" (1.543 m)  Wt 141 lb 9.6 oz (64.229 kg)  BMI 26.98 kg/m2  SpO2 98%  Assessment and Plan:  1. Annual physical exam Referred for mammogram Pt declined colon cancer screening. Up to date on immunizations Up to date on pap Discussed the importance of regular exercise. - CBC - POCT urinalysis dipstick  2. Needs flu shot 3. Encounter for immunization - Flu Vaccine QUAD 36+ mos IM - Measles/Mumps/Rubella Immunity - Varicella zoster antibody, IgG  4. Screening-pulmonary TB - Quantiferon tb gold assay  5. Lipid screening - Lipid panel  6. Diabetes mellitus screening - Comprehensive metabolic panel  7. Essential hypertension HTN not controlled. Pt does not like taking hctz because makes her urinate too much - takes inconsistently. She will stop  hctz. Start lisinopril 10 mg and continue amlodipine 5 mg. Return in 3 months for f/u. - lisinopril (PRINIVIL,ZESTRIL) 10 MG tablet; Take 1 tablet (10 mg total) by mouth daily.  Dispense: 30 tablet; Refill: 5 - amLODipine (NORVASC) 5 MG tablet; Take 1 tablet (5 mg total) by mouth daily.  Dispense: 30 tablet; Refill: 5  8. Joint pain 9. Neck pain Meloxicam daily, robaxin and tramadol prn. Controls her pain. Refilled. - meloxicam (MOBIC) 15 MG tablet; Take 1 tablet (15 mg total) by mouth daily. Take with food, no other NSAIDs  Dispense: 30 tablet; Refill: 5 - methocarbamol (ROBAXIN) 500 MG tablet; Take 1 tablet (500 mg total) by mouth every 6 (six) hours as needed for muscle spasms.  Dispense: 30 tablet; Refill: 5 - traMADol (ULTRAM) 50 MG tablet; Take 1 tablet (50 mg total) by mouth every 8 (eight) hours as needed.  Dispense: 30 tablet; Refill: 2 - traMADol (ULTRAM) 50 MG tablet; Take 1 tablet (50 mg total) by mouth every 8 (eight) hours as needed.  Dispense: 30 tablet; Refill: 2   10. Gastroesophageal reflux disease without esophagitis Pt takes dexilant only as needed, 1-2 times per week. I am ok with high dose since not using consistently. May need to consider lower dose more consistent therapy d/t recent episodes cp. Will discuss at f/u in 3 months. - dexlansoprazole (DEXILANT) 60 MG capsule; Take 1 capsule (60 mg total) by mouth daily.  Dispense: 30 capsule; Refill: 5  11. Breast cancer screening - MM Digital Screening; Future   Roswell MinersNicole V. Dyke BrackettBush, PA-C, MHS [facility]  10/29/2015

## 2015-10-24 NOTE — Patient Instructions (Signed)
I will print out lab results when ready for you to pick up. All of your meds have been refilled. Stop hydrochlorithiazide. Start lisinopril instead. Keep checking bp often. Let me know if bp consistently >140/90 Return in 3 months for follow up.

## 2015-10-25 LAB — MEASLES/MUMPS/RUBELLA IMMUNITY
MUMPS IGG: 93 [AU]/ml — AB (ref <9.00)
Rubella: 23.1 Index — ABNORMAL HIGH (ref <0.90)
Rubeola IgG: >300 AU/mL — ABNORMAL HIGH (ref <25.00)

## 2015-10-25 LAB — VARICELLA ZOSTER ANTIBODY, IGG: Varicella IgG: 366.1 Index — ABNORMAL HIGH (ref <135.00)

## 2015-10-26 ENCOUNTER — Telehealth: Payer: Self-pay

## 2015-10-26 LAB — QUANTIFERON TB GOLD ASSAY (BLOOD)
INTERFERON GAMMA RELEASE ASSAY: NEGATIVE
QUANTIFERON TB AG MINUS NIL: 0.2 [IU]/mL
Quantiferon Nil Value: 0.03 IU/mL
TB Ag value: 0.23 IU/mL

## 2015-10-26 NOTE — Telephone Encounter (Signed)
PA approved for dexilant through 10/25/16, case # [id number]. Notified pharm.

## 2015-10-29 DIAGNOSIS — M542 Cervicalgia: Secondary | ICD-10-CM | POA: Insufficient documentation

## 2015-10-30 ENCOUNTER — Other Ambulatory Visit: Payer: Self-pay

## 2015-10-30 ENCOUNTER — Encounter: Payer: Self-pay | Admitting: Physician Assistant

## 2015-10-30 DIAGNOSIS — Z1231 Encounter for screening mammogram for malignant neoplasm of breast: Secondary | ICD-10-CM

## 2016-01-22 ENCOUNTER — Ambulatory Visit: Payer: BLUE CROSS/BLUE SHIELD | Admitting: Physician Assistant

## 2016-03-12 ENCOUNTER — Other Ambulatory Visit: Payer: Self-pay | Admitting: Physician Assistant

## 2016-03-17 ENCOUNTER — Other Ambulatory Visit: Payer: Self-pay | Admitting: Physician Assistant

## 2016-07-11 ENCOUNTER — Other Ambulatory Visit: Payer: Self-pay | Admitting: Orthopedic Surgery

## 2016-07-11 DIAGNOSIS — M5412 Radiculopathy, cervical region: Secondary | ICD-10-CM

## 2016-07-22 ENCOUNTER — Ambulatory Visit
Admission: RE | Admit: 2016-07-22 | Discharge: 2016-07-22 | Disposition: A | Discharge status: Home / Self Care | Payer: BLUE CROSS/BLUE SHIELD | Source: Ambulatory Visit | Attending: Orthopedic Surgery | Admitting: Orthopedic Surgery

## 2016-07-22 DIAGNOSIS — M5412 Radiculopathy, cervical region: Secondary | ICD-10-CM

## 2016-07-28 ENCOUNTER — Ambulatory Visit: Payer: BLUE CROSS/BLUE SHIELD

## 2016-07-29 ENCOUNTER — Ambulatory Visit (INDEPENDENT_AMBULATORY_CARE_PROVIDER_SITE_OTHER): Payer: BLUE CROSS/BLUE SHIELD | Admitting: Family Medicine

## 2016-07-29 ENCOUNTER — Encounter: Payer: Self-pay | Admitting: Family Medicine

## 2016-07-29 VITALS — BP 136/82 | HR 73 | Temp 98.2°F | Resp 17 | Ht 60.0 in | Wt 143.0 lb

## 2016-07-29 DIAGNOSIS — M542 Cervicalgia: Secondary | ICD-10-CM

## 2016-07-29 DIAGNOSIS — E041 Nontoxic single thyroid nodule: Secondary | ICD-10-CM

## 2016-07-29 LAB — TSH: TSH: 2.4 mIU/L

## 2016-07-29 NOTE — Assessment & Plan Note (Addendum)
Reviewed MRI with pt, she is seeing Dr. Yevette Edwardsumonski, a spine specialist.  Recommend continue with his medications and follow up if has continuing pain.

## 2016-07-29 NOTE — Assessment & Plan Note (Addendum)
Incidental finding on cervical spine MRI, sister has hypothyroidism.  Will get TSH, thyroid ultrasound.  Follow up 1 week to go over results and further management.  Did discuss possibilities- benign mass, hot nodule, cancer.  Reviewed possible treatment options as well.  Could not palpate thyroid nodule on exam.

## 2016-07-29 NOTE — Progress Notes (Signed)
   Subjective:    Patient ID: Pam Hart, female    DOB: [birth year], 55 y.o.   MRN: [id number]  HPI Presents for follow up from MRI, ordered by spine specialist, Dr. Yevette Edwardsumonski.  He is treating her neck pain with medications and recommended considering an injection if these do not help.  Incidentally a thyroid mass of 1.6 cm on right thyroid was found.  She denies heat or cold intolerance, constipation, diarrhea, changes in weight.  Her sister has a history of hypothyroidism.  No history of cancer.  She does not smoke.      Review of Systems  Constitutional: Negative for chills, fatigue and fever.  Respiratory: Negative for cough, chest tightness and shortness of breath.   Cardiovascular: Negative for chest pain and leg swelling.  Endocrine: Negative for cold intolerance and heat intolerance.  Musculoskeletal: Positive for neck pain. Negative for joint swelling.  Skin: Negative for rash and wound.  Neurological: Negative for weakness and numbness.  Psychiatric/Behavioral: Negative for agitation and confusion.  All other systems reviewed and are negative.      Objective:   Physical Exam  Constitutional: She is oriented to person, place, and time. She appears well-developed and well-nourished. No distress.  HENT:  Head: Normocephalic and atraumatic.  Right Ear: External ear normal.  Left Ear: External ear normal.  Neck: Normal range of motion. Neck supple. No thyromegaly present.  Pulmonary/Chest: Effort normal. No respiratory distress.  Musculoskeletal: Normal range of motion. She exhibits no edema.  Neurological: She is alert and oriented to person, place, and time.  Skin: Skin is warm. No rash noted. She is not diaphoretic. No erythema.  Psychiatric: She has a normal mood and affect. Her behavior is normal. Judgment and thought content normal.  Nursing note and vitals reviewed.  BP 136/82 (BP Location: Right Arm, Patient Position: Sitting, Cuff Size: Large)   Pulse 73   Temp  98.2 F (36.8 C) (Oral)   Resp 17   Ht 5' (1.524 m)   Wt 143 lb (64.9 kg)   SpO2 100%   BMI 27.93 kg/m         Assessment & Plan:  Thyroid nodule Incidental finding on cervical spine MRI, sister has hypothyroidism.  Will get TSH, thyroid ultrasound.  Follow up 1 week to go over results and further management.  Did discuss possibilities- benign mass, hot nodule, cancer.  Reviewed possible treatment options as well.  Could not palpate thyroid nodule on exam.    Neck pain Reviewed MRI with pt, she is seeing Dr. Yevette Edwardsumonski, a spine specialist.  Recommend continue with his medications and follow up if pain requires epidural spinal injection.

## 2016-07-29 NOTE — Progress Notes (Deleted)
PROCEDURE NOTE: Ingrown Toenail Removal  Verbal Consent Obtained. Right hallux wiped with alcohol prep pad, then digital block with 5cc of 1% Xylocaine with 3cc of 0.05% Marcaine. Sterile prep and drape. *** nail lifted and excised, *** aspect of *** nail left intact. *** nail lifted and excised in its entirety. Proximal aspect of nail bed explored revealing no nail remnants. Ingrown tissue debrided. No active bleeding. Xeroform dressing applied. Cleansed and dressed. Wound care instructions including precautions reviewed with patient.  Benjiman CoreBrittany Harbour Nordmeyer, PA-C  [facility] 07/29/2016 2:06 PM

## 2016-07-29 NOTE — Patient Instructions (Addendum)
[facility].  Follow up next week for results and further management.  Continue with Dr. Marshell Levanumonski's management for your neck pain and consider neck injection by him if needed.    IF you received an x-ray today, you will receive an invoice from [facility]. Please contact [facility] at [phone] with questions or concerns regarding your invoice.   IF you received labwork today, you will receive an invoice from [facility]. Please contact [facility] at [phone] with questions or concerns regarding your invoice.   Our billing staff will not be able to assist you with questions regarding bills from these companies.  You will be contacted with the lab results as soon as they are available. The fastest way to get your results is to activate your My Chart account. Instructions are located on the last page of this paperwork. If you have not heard from us regarding the results in 2 weeks, please contact this office.

## 2016-08-06 ENCOUNTER — Ambulatory Visit
Admission: RE | Admit: 2016-08-06 | Discharge: 2016-08-06 | Disposition: A | Discharge status: Home / Self Care | Payer: BLUE CROSS/BLUE SHIELD | Source: Ambulatory Visit | Attending: Student | Admitting: Student

## 2016-08-06 DIAGNOSIS — E041 Nontoxic single thyroid nodule: Secondary | ICD-10-CM

## 2016-08-07 ENCOUNTER — Ambulatory Visit (INDEPENDENT_AMBULATORY_CARE_PROVIDER_SITE_OTHER): Payer: BLUE CROSS/BLUE SHIELD | Admitting: Family Medicine

## 2016-08-07 ENCOUNTER — Encounter: Payer: Self-pay | Admitting: Family Medicine

## 2016-08-07 VITALS — BP 144/80 | HR 74 | Temp 98.5°F | Resp 16 | Ht 60.25 in | Wt 144.0 lb

## 2016-08-07 DIAGNOSIS — K219 Gastro-esophageal reflux disease without esophagitis: Secondary | ICD-10-CM

## 2016-08-07 DIAGNOSIS — L659 Nonscarring hair loss, unspecified: Secondary | ICD-10-CM

## 2016-08-07 DIAGNOSIS — E042 Nontoxic multinodular goiter: Secondary | ICD-10-CM | POA: Diagnosis not present

## 2016-08-07 DIAGNOSIS — R5383 Other fatigue: Secondary | ICD-10-CM | POA: Diagnosis not present

## 2016-08-07 LAB — CBC WITH DIFFERENTIAL/PLATELET
BASOS PCT: 0 %
Basophils Absolute: 0 cells/uL (ref 0–200)
EOS ABS: 132 {cells}/uL (ref 15–500)
Eosinophils Relative: 2 %
HEMATOCRIT: 39.4 % (ref 35.0–45.0)
HEMOGLOBIN: 13.2 g/dL (ref 11.7–15.5)
LYMPHS ABS: 2574 {cells}/uL (ref 850–3900)
LYMPHS PCT: 39 %
MCH: 26.9 pg — ABNORMAL LOW (ref 27.0–33.0)
MCHC: 33.5 g/dL (ref 32.0–36.0)
MCV: 80.4 fL (ref 80.0–100.0)
MONO ABS: 462 {cells}/uL (ref 200–950)
MPV: 10.4 fL (ref 7.5–12.5)
Monocytes Relative: 7 %
Neutro Abs: 3432 cells/uL (ref 1500–7800)
Neutrophils Relative %: 52 %
Platelets: 308 10*3/uL (ref 140–400)
RBC: 4.9 MIL/uL (ref 3.80–5.10)
RDW: 14.3 % (ref 11.0–15.0)
WBC: 6.6 10*3/uL (ref 3.8–10.8)

## 2016-08-07 LAB — COMPLETE METABOLIC PANEL WITH GFR
ALT: 15 U/L (ref 6–29)
AST: 16 U/L (ref 10–35)
Albumin: 4.2 g/dL (ref 3.6–5.1)
Alkaline Phosphatase: 71 U/L (ref 33–130)
BUN: 8 mg/dL (ref 7–25)
CHLORIDE: 105 mmol/L (ref 98–110)
CO2: 29 mmol/L (ref 20–31)
CREATININE: 0.74 mg/dL (ref 0.50–1.05)
Calcium: 9.1 mg/dL (ref 8.6–10.4)
GFR, Est Non African American: >89 mL/min (ref >=60)
Glucose, Bld: 92 mg/dL (ref 65–99)
POTASSIUM: 4 mmol/L (ref 3.5–5.3)
Sodium: 142 mmol/L (ref 135–146)
Total Bilirubin: 0.3 mg/dL (ref 0.2–1.2)
Total Protein: 6.6 g/dL (ref 6.1–8.1)

## 2016-08-07 LAB — T3, FREE: T3 FREE: 2.9 pg/mL (ref 2.3–4.2)

## 2016-08-07 LAB — T4, FREE: Free T4: 1.1 ng/dL (ref 0.8–1.8)

## 2016-08-07 MED ORDER — OMEPRAZOLE 20 MG PO CPDR: 20.0000 mg | DELAYED_RELEASE_CAPSULE | Freq: Every day | ORAL | 5 refills | Status: DC

## 2016-08-07 NOTE — Patient Instructions (Addendum)
I will check your blood counts, electrolytes, and 2 other thyroid tests to further evaluate fatigue. If these tests are normal, return to discuss fatigue further.  There were multiple nodules noted on your thyroid, but as we discussed none our meeting criteria for biopsy or other evaluation at this time. We can recheck an ultrasound in 6 months to a year to make sure this remains stable.  Continue acid blocker medication as needed.    Fatigue Fatigue is feeling tired all of the time, a lack of energy, or a lack of motivation. Occasional or mild fatigue is often a normal response to activity or life in general. However, long-lasting (chronic) or extreme fatigue may indicate an underlying medical condition. HOME CARE INSTRUCTIONS  Watch your fatigue for any changes. The following actions may help to lessen any discomfort you are feeling:  Talk to your health care provider about how much sleep you need each night. Try to get the required amount every night.  Take medicines only as directed by your health care provider.  Eat a healthy and nutritious diet. Ask your health care provider if you need help changing your diet.  Drink enough fluid to keep your urine clear or pale yellow.  Practice ways of relaxing, such as yoga, meditation, massage therapy, or acupuncture.  Exercise regularly.   Change situations that cause you stress. Try to keep your work and personal routine reasonable.  Do not abuse illegal drugs.  Limit alcohol intake to no more than 1 drink per day for nonpregnant women and 2 drinks per day for men. One drink equals 12 ounces of beer, 5 ounces of wine, or 1 ounces of hard liquor.  Take a multivitamin, if directed by your health care provider. SEEK MEDICAL CARE IF:   Your fatigue does not get better.  You have a fever.   You have unintentional weight loss or gain.  You have headaches.   You have difficulty:   Falling asleep.  Sleeping throughout the  night.  You feel angry, guilty, anxious, or sad.   You are unable to have a bowel movement (constipation).   You skin is dry.   Your legs or another part of your body is swollen.  SEEK IMMEDIATE MEDICAL CARE IF:   You feel confused.   Your vision is blurry.  You feel faint or pass out.   You have a severe headache.   You have severe abdominal, pelvic, or back pain.   You have chest pain, shortness of breath, or an irregular or fast heartbeat.   You are unable to urinate or you urinate less than normal.   You develop abnormal bleeding, such as bleeding from the rectum, vagina, nose, lungs, or nipples.  You vomit blood.   You have thoughts about harming yourself or committing suicide.   You are worried that you might harm someone else.    This information is not intended to replace advice given to you by your health care provider. Make sure you discuss any questions you have with your health care provider.   Document Released: 08/31/2007 Document Revised: 11/24/2014 Document Reviewed: 03/07/2014 Elsevier Interactive Patient Education 2016 [employer]     IF you received an x-ray today, you will receive an invoice from [facility]. Please contact [facility] at [phone] with questions or concerns regarding your invoice.   IF you received labwork today, you will receive an invoice from [facility]. Please contact [facility] at [phone] with questions or concerns  regarding your invoice.   Our billing staff will not be able to assist you with questions regarding bills from these companies.  You will be contacted with the lab results as soon as they are available. The fastest way to get your results is to activate your My Chart account. Instructions are located on the last page of this paperwork. If you have not heard from [place] regarding the results in 2 weeks, please contact this office.

## 2016-08-07 NOTE — Progress Notes (Signed)
Subjective:  By signing my name below, I, Moises Blood, attest that this documentation has been prepared under the direction and in the presence of Merri Ray, MD. Electronically Signed: Moises Blood, [place]. 08/07/2016 , 4:28 PM .  Patient was seen in Room 4 .   Patient ID: Pam Hart, female    DOB: 09-Dec-1960, 55 y.o.   MRN: [id number] Chief Complaint  Patient presents with   Follow-up    THYROID RESULTS   Medication Refill    Omeprazole   HPI Pam Hart is a 55 y.o. female Here for follow up. Patient was last seen on Sept 12th for a thyroid mass that was incidentally found from cervical spine MRI. She was seen by Dr. Drema Dallas last week. Her TSH was normal. She had an ultrasound of the thyroid performed yesterday, which showed bilateral thyroid nodules but none of which met criteria for biopsy based on size.   FINDINGS from thyroid ultrasound:  Center part of thyroid: none Right lobe: 1.6 x 1.3 x 1.3 cm nodule on upper part; nodule does NOT meet TI-RADS criteria Left lobe: small cyst on the upper part; nodule does NOT meet TI-RADS criteria Left lobe: small nodule on lower part; nodule does NOT meet TI-RADS criteria  IMPRESSION:  Bilateral thyroid nodules which do not meet criteria for biopsy or dedicated follow up.   Patient notes losing hair and fatigue over the past 2~3 months. She denies dysphoric mood or anxiety. She does mention low potassium in the past.   She also requests medication refill of omeprazole. She's usually takes it about 2~3 times a week prn.   She notes still taking meloxicam 2~3 times a week. She received a refill recently. She also takes tramadol 1~2 times a week prn for her neck pain.   Patient Active Problem List   Diagnosis Date Noted   Thyroid nodule 07/29/2016   Neck pain 10/29/2015   Hypertension, uncontrolled 08/22/2015   Chest pain with moderate risk of acute coronary syndrome 08/22/2015   GERD (gastroesophageal reflux  disease) 08/22/2015   Hyperlipidemia 03/17/2013   FATIGUE 08/05/2007   Past Medical History:  Diagnosis Date   Anemia    Arthritis    Hypertension    Thyroid disease    Past Surgical History:  Procedure Laterality Date   APPENDECTOMY     EYE SURGERY Right 2011   lasix    No Known Allergies Prior to Admission medications   Medication Sig Start Date End Date Taking? Authorizing Provider  amLODipine (NORVASC) 5 MG tablet Take 1 tablet (5 mg total) by mouth daily. 10/24/15  Yes Ezekiel Slocumb, PA-C  lisinopril (PRINIVIL,ZESTRIL) 10 MG tablet Take 1 tablet (10 mg total) by mouth daily. 10/24/15  Yes Ezekiel Slocumb, PA-C  meloxicam (MOBIC) 15 MG tablet Take 1 tablet (15 mg total) by mouth daily. Take with food, no other NSAIDs 10/24/15  Yes Ezekiel Slocumb, PA-C  methocarbamol (ROBAXIN) 500 MG tablet Take 1 tablet (500 mg total) by mouth every 6 (six) hours as needed for muscle spasms. 10/24/15  Yes Ezekiel Slocumb, PA-C  omeprazole (PRILOSEC) 20 MG capsule Take 1 capsule (20 mg total) by mouth daily. 04/12/15  Yes Amy S Esterwood, PA-C  traMADol (ULTRAM) 50 MG tablet Take 1 tablet (50 mg total) by mouth every 8 (eight) hours as needed. 10/24/15  Yes Ezekiel Slocumb, PA-C   Social History   Social History   Marital status: Married    Spouse name:  N/A   Number of children: 4   Years of education: N/A   Occupational History   Interpreter    Social History Main Topics   Smoking status: Never Smoker   Smokeless tobacco: Never Used   Alcohol use No   Drug use: No   Sexual activity: Not on file   Other Topics Concern   Not on file   Social History Narrative   Married   Education: College   Exercise: No   Review of Systems  Constitutional: Positive for fatigue. Negative for chills, fever and unexpected weight change.  HENT:       Hair loss  Respiratory: Negative for cough.   Gastrointestinal: Negative for constipation, diarrhea, nausea and vomiting.  Musculoskeletal:  Positive for neck pain.  Skin: Negative for rash and wound.  Neurological: Negative for dizziness, weakness and headaches.  Psychiatric/Behavioral: Negative for dysphoric mood. The patient is not nervous/anxious.        Objective:   Physical Exam  Constitutional: She is oriented to person, place, and time. She appears well-developed and well-nourished. No distress.  HENT:  Head: Normocephalic and atraumatic.  Eyes: EOM are normal. Pupils are equal, round, and reactive to light.  Neck: Neck supple. No thyromegaly (no palpable nodules) present.  Cardiovascular: Normal rate, regular rhythm and normal heart sounds.   No murmur heard. Pulmonary/Chest: Effort normal and breath sounds normal. No respiratory distress.  Musculoskeletal: Normal range of motion.  Neurological: She is alert and oriented to person, place, and time.  Skin: Skin is warm and dry.  Psychiatric: She has a normal mood and affect. Her behavior is normal.  Nursing note and vitals reviewed.   Vitals:   08/07/16 1547  BP: (!) 160/80  Pulse: 74  Resp: 16  Temp: 98.5 F (36.9 C)  TempSrc: Oral  SpO2: 97%  Weight: 144 lb (65.3 kg)  Height: 5' 0.25" (1.53 m)      Assessment & Plan:   Pam Hart is a 55 y.o. female Gastroesophageal reflux disease, esophagitis presence not specified - Plan: omeprazole (PRILOSEC) 20 MG capsule  - Omeprazole as needed, trigger avoidance, RTC precautions.  Other fatigue - Plan: COMPLETE METABOLIC PANEL WITH GFR, CBC with Differential/Platelet, T4, Free, T3, Free  - Will check T4 and T3 although prior TSH okay. CMP, CBC, and follow-up to discuss further.  Multiple thyroid nodules - Plan: T4, Free, T3, Free  - As above, no apparent indication for aspiration or biopsy. Consider repeat ultrasound in the next 6 months to year to make sure these are stable. Based on her symptoms will check T3 and T4, consider endocrine eval if persistent symptoms.  Falling hair - Plan: T4, Free, T3,  Free  - As above.  Elevated BP - elevated at this visit, but normal prior visit. Plan on repeat visit for fatigue if labs normal and can recheck/discss meds at that time if still elevated.   Meds ordered this encounter  Medications   omeprazole (PRILOSEC) 20 MG capsule    Sig: Take 1 capsule (20 mg total) by mouth daily.    Dispense:  30 capsule    Refill:  5   Patient Instructions   I will check your blood counts, electrolytes, and 2 other thyroid tests to further evaluate fatigue. If these tests are normal, return to discuss fatigue further.  There were multiple nodules noted on your thyroid, but as we discussed none our meeting criteria for biopsy or other evaluation at this time. We can  recheck an ultrasound in 6 months to a year to make sure this remains stable.  Continue acid blocker medication as needed.    Fatigue Fatigue is feeling tired all of the time, a lack of energy, or a lack of motivation. Occasional or mild fatigue is often a normal response to activity or life in general. However, long-lasting (chronic) or extreme fatigue may indicate an underlying medical condition. HOME CARE INSTRUCTIONS  Watch your fatigue for any changes. The following actions may help to lessen any discomfort you are feeling:  Talk to your health care provider about how much sleep you need each night. Try to get the required amount every night.  Take medicines only as directed by your health care provider.  Eat a healthy and nutritious diet. Ask your health care provider if you need help changing your diet.  Drink enough fluid to keep your urine clear or pale yellow.  Practice ways of relaxing, such as yoga, meditation, massage therapy, or acupuncture.  Exercise regularly.   Change situations that cause you stress. Try to keep your work and personal routine reasonable.  Do not abuse illegal drugs.  Limit alcohol intake to no more than 1 drink per day for nonpregnant women and 2  drinks per day for men. One drink equals 12 ounces of beer, 5 ounces of wine, or 1 ounces of hard liquor.  Take a multivitamin, if directed by your health care provider. SEEK MEDICAL CARE IF:   Your fatigue does not get better.  You have a fever.   You have unintentional weight loss or gain.  You have headaches.   You have difficulty:   Falling asleep.  Sleeping throughout the night.  You feel angry, guilty, anxious, or sad.   You are unable to have a bowel movement (constipation).   You skin is dry.   Your legs or another part of your body is swollen.  SEEK IMMEDIATE MEDICAL CARE IF:   You feel confused.   Your vision is blurry.  You feel faint or pass out.   You have a severe headache.   You have severe abdominal, pelvic, or back pain.   You have chest pain, shortness of breath, or an irregular or fast heartbeat.   You are unable to urinate or you urinate less than normal.   You develop abnormal bleeding, such as bleeding from the rectum, vagina, nose, lungs, or nipples.  You vomit blood.   You have thoughts about harming yourself or committing suicide.   You are worried that you might harm someone else.    This information is not intended to replace advice given to you by your health care provider. Make sure you discuss any questions you have with your health care provider.   Document Released: 08/31/2007 Document Revised: 11/24/2014 Document Reviewed: 03/07/2014 Elsevier Interactive Patient Education 2016 [employer].     IF you received an x-ray today, you will receive an invoice from [facility]. Please contact [facility] at [phone] with questions or concerns regarding your invoice.   IF you received labwork today, you will receive an invoice from [employer]. Please contact [facility] at [phone] with questions or concerns regarding your invoice.   Our billing staff will not  be able to assist you with questions regarding bills from these companies.  You will be contacted with the lab results as soon as they are available. The fastest way to get your results is to activate your My Chart account. Instructions  are located on the last page of this paperwork. If you have not heard from [place] regarding the results in 2 weeks, please contact this office.       I personally performed the services described in this documentation, which was scribed in my presence. The recorded information has been reviewed and considered, and addended by me as needed.   Signed,   Merri Ray, MD [facility].  08/10/16 11:51 AM

## 2016-08-22 ENCOUNTER — Encounter: Payer: Self-pay | Admitting: *Deleted

## 2016-11-03 ENCOUNTER — Ambulatory Visit (INDEPENDENT_AMBULATORY_CARE_PROVIDER_SITE_OTHER): Payer: BLUE CROSS/BLUE SHIELD | Admitting: Family Medicine

## 2016-11-03 VITALS — BP 160/84 | HR 65 | Temp 98.2°F | Resp 17 | Ht 60.0 in | Wt 145.0 lb

## 2016-11-03 DIAGNOSIS — Z780 Asymptomatic menopausal state: Secondary | ICD-10-CM

## 2016-11-03 DIAGNOSIS — M25562 Pain in left knee: Secondary | ICD-10-CM | POA: Diagnosis not present

## 2016-11-03 DIAGNOSIS — Z23 Encounter for immunization: Secondary | ICD-10-CM | POA: Diagnosis not present

## 2016-11-03 DIAGNOSIS — I10 Essential (primary) hypertension: Secondary | ICD-10-CM

## 2016-11-03 DIAGNOSIS — K219 Gastro-esophageal reflux disease without esophagitis: Secondary | ICD-10-CM | POA: Diagnosis not present

## 2016-11-03 DIAGNOSIS — M25561 Pain in right knee: Secondary | ICD-10-CM

## 2016-11-03 DIAGNOSIS — Z0000 Encounter for general adult medical examination without abnormal findings: Secondary | ICD-10-CM | POA: Diagnosis not present

## 2016-11-03 DIAGNOSIS — R5382 Chronic fatigue, unspecified: Secondary | ICD-10-CM

## 2016-11-03 LAB — POCT CBC
Granulocyte percent: 51 %G (ref 37–80)
HCT, POC: 39 % (ref 37.7–47.9)
HEMOGLOBIN: 13.5 g/dL (ref 12.2–16.2)
LYMPH, POC: 3.4 (ref 0.6–3.4)
MCH: 27.5 pg (ref 27–31.2)
MCHC: 34.6 g/dL (ref 31.8–35.4)
MCV: 79.5 fL — AB (ref 80–97)
MID (cbc): 0.4 (ref 0–0.9)
MPV: 8.2 fL (ref 0–99.8)
POC GRANULOCYTE: 3.9 (ref 2–6.9)
POC LYMPH PERCENT: 43.9 %L (ref 10–50)
POC MID %: 5.1 %M (ref 0–12)
Platelet Count, POC: 244 10*3/uL (ref 142–424)
RBC: 4.9 M/uL (ref 4.04–5.48)
RDW, POC: 13.7 %
WBC: 7.7 10*3/uL (ref 4.6–10.2)

## 2016-11-03 MED ORDER — TRAMADOL HCL 50 MG PO TABS: 50.0000 mg | ORAL_TABLET | Freq: Three times a day (TID) | ORAL | 2 refills | Status: DC | PRN

## 2016-11-03 MED ORDER — OMEPRAZOLE 20 MG PO CPDR: 20.0000 mg | DELAYED_RELEASE_CAPSULE | Freq: Every day | ORAL | 11 refills | Status: DC

## 2016-11-03 NOTE — Progress Notes (Signed)
Chief Complaint  Patient presents with  . Annual Exam    Urine collected. PT GIVEN GOWN, PT ENCOURAGED TO FILL OUT PAPERWORK WHILE WAITING.     Subjective:  Pam Hart is a 55 y.o. female here for a health maintenance visit.  Patient is established pt  Hypertension She reports that she gets high blood pressure readings at home and gets a headache She usually checks her blood pressure when she is having a headache Her highest readings are 160-180s systolic Typically her blood pressures are 140s systolic    Patient Active Problem List   Diagnosis Date Noted  . Thyroid nodule 07/29/2016  . Neck pain 10/29/2015  . Hypertension, uncontrolled 08/22/2015  . Chest pain with moderate risk of acute coronary syndrome 08/22/2015  . GERD (gastroesophageal reflux disease) 08/22/2015  . Hyperlipidemia 03/17/2013  . FATIGUE 08/05/2007    Past Medical History:  Diagnosis Date  . Anemia   . Arthritis   . Hypertension   . Thyroid disease     Past Surgical History:  Procedure Laterality Date  . APPENDECTOMY    . EYE SURGERY Right 2011   lasix      Outpatient Medications Prior to Visit  Medication Sig Dispense Refill  . amLODipine (NORVASC) 5 MG tablet Take 1 tablet (5 mg total) by mouth daily. 30 tablet 5  . lisinopril (PRINIVIL,ZESTRIL) 10 MG tablet Take 1 tablet (10 mg total) by mouth daily. 30 tablet 5  . methocarbamol (ROBAXIN) 500 MG tablet Take 1 tablet (500 mg total) by mouth every 6 (six) hours as needed for muscle spasms. 30 tablet 5  . omeprazole (PRILOSEC) 20 MG capsule Take 1 capsule (20 mg total) by mouth daily. 30 capsule 5  . traMADol (ULTRAM) 50 MG tablet Take 1 tablet (50 mg total) by mouth every 8 (eight) hours as needed. 30 tablet 2   No facility-administered medications prior to visit.     No Known Allergies   Family History  Problem Relation Age of Onset  . Arthritis Maternal Aunt   . Hypertension Mother   . Heart disease Mother   . Alzheimer's  disease Father   . Glaucoma Father   . Colon cancer Neg Hx   . Colon polyps Neg Hx   . Esophageal cancer Neg Hx   . Kidney disease Neg Hx   . Gallbladder disease Neg Hx   . Diabetes Sister   . Hypertension Sister      Health Habits: Dental Exam: not up to date Eye Exam: up to date Exercise: 0 times/week on average Current exercise activities: none Diet: cooks most meals at home  Social History   Social History  . Marital status: Married    Spouse name: N/A  . Number of children: 4  . Years of education: N/A   Occupational History  . Interpreter    Social History Main Topics  . Smoking status: Never Smoker  . Smokeless tobacco: Never Used  . Alcohol use No  . Drug use: No  . Sexual activity: Not on file   Other Topics Concern  . Not on file   Social History Narrative   Married   Education: College   Exercise: No   History  Alcohol Use No   History  Smoking Status  . Never Smoker  Smokeless Tobacco  . Never Used   History  Drug Use No    GYN: Sexual Health LMP: No LMP recorded. Patient is postmenopausal. Last pap smear: see HM section  Health Maintenance: See under health Maintenance activity for review of completion dates as well. Immunization History  Administered Date(s) Administered  . Influenza Whole 08/30/2010  . Influenza,inj,Quad PF,36+ Mos 10/24/2015  . Td 11/17/2005  . Tdap 01/24/2015      Depression Screen-PHQ2/9 [facility] screen [facility] 11/03/2016 11/03/2016 08/07/2016 07/29/2016 10/24/2015  Decreased Interest 0 0 0 0 0  Down, Depressed, Hopeless 0 0 0 0 0  PHQ - 2 Score 0 0 0 0 0      Depression Severity and Treatment Recommendations:  0-4= None  5-9= Mild / Treatment: Support, educate to call if worse; return in one month  10-14= Moderate / Treatment: Support, watchful waiting; Antidepressant or Psycotherapy  15-19= Moderately severe / Treatment: Antidepressant OR Psychotherapy  >= 20 = Major depression, severe /  Antidepressant AND Psychotherapy    Review of Systems   Review of Systems  Constitutional: Negative for chills and fever.  HENT: Negative for ear pain, hearing loss and tinnitus.   Eyes: Negative for blurred vision and double vision.  Respiratory: Negative for cough and shortness of breath.   Cardiovascular: Negative for chest pain, palpitations and orthopnea.  Gastrointestinal: Negative for abdominal pain, nausea and vomiting.  Genitourinary: Negative for dysuria, frequency and urgency.  Neurological: Positive for headaches. Negative for dizziness and tremors.  Psychiatric/Behavioral: The patient is not nervous/anxious and does not have insomnia.     See HPI for ROS as well.    Objective:   Vitals:   11/03/16 1453  BP: (!) 160/84  Pulse: 65  Resp: 17  Temp: 98.2 F (36.8 C)  TempSrc: Oral  SpO2: 98%  Weight: 145 lb (65.8 kg)  Height: 5' (1.524 m)    Body mass index is 28.32 kg/m.  Physical Exam  Constitutional: She is oriented to person, place, and time. She appears well-developed and well-nourished.  HENT:  Head: Normocephalic and atraumatic.  Eyes: Conjunctivae and EOM are normal.  Neck: Normal range of motion. Neck supple.  Cardiovascular: Normal rate, regular rhythm and normal heart sounds.   Pulmonary/Chest: Effort normal and breath sounds normal. No respiratory distress. She has no wheezes.  Abdominal: Soft. Bowel sounds are normal. She exhibits no distension. There is no tenderness.  Musculoskeletal: Normal range of motion. She exhibits no edema.  Neurological: She is alert and oriented to person, place, and time. She has normal reflexes. No cranial nerve deficit.  Skin: Skin is warm. No erythema.  Psychiatric: She has a normal mood and affect. Her behavior is normal. Judgment and thought content normal.       Assessment/Plan:   Patient was seen for a health maintenance exam.  Counseled the patient on health maintenance issues. Reviewed her health  mainteance schedule and ordered appropriate tests (see orders.) Counseled on regular exercise and weight management. Recommend regular eye exams and dental cleaning.   The following issues were addressed today for health maintenance:   Pam Hart was seen today for annual exam.  Diagnoses and all orders for this visit:  Postmenopausal- discussed bone density screening -     DG Bone Density; Future -     VITAMIN D 25 Hydroxy (Vit-D Deficiency, Fractures) -     POCT CBC  Health maintenance examination- discussed bone density, mammogram as screenings Pt to get flu vaccine and pneumonia vaccine at [facility]  -     Lipid panel  Chronic fatigue- multifactoral Advised her to try screening for causes of anemia Discussed exercise to improve energy -  Vitamin B12 -     VITAMIN D 25 Hydroxy (Vit-D Deficiency, Fractures) -     Hemoglobin A1c -     POCT CBC  Uncontrolled hypertension- pt blood pressure is typically in 140s systolic range Advised her to resume her home meds after she eats She will need continued blood pressure monitoring -     Lipid panel -     Comprehensive metabolic panel -     Microalbumin, urine -     Hemoglobin A1c  Arthralgia of both knees- continue exercise, take prn pain meds -     traMADol (ULTRAM) 50 MG tablet; Take 1 tablet (50 mg total) by mouth every 8 (eight) hours as needed.  Gastroesophageal reflux disease, esophagitis presence not specified- since pt is taking NSAIDs advised to continue prilosec -     omeprazole (PRILOSEC) 20 MG capsule; Take 1 capsule (20 mg total) by mouth daily.    No Follow-up on file.    Body mass index is 28.32 kg/m.:  Discussed the patient's BMI with patient. The BMI body mass index is 28.32 kg/m.     No future appointments.  Patient Instructions       IF you received an x-ray today, you will receive an invoice from [facility]. Please contact [facility] at [phone] with questions or concerns  regarding your invoice.   IF you received labwork today, you will receive an invoice from [place]. Please contact [facility] at [phone] with questions or concerns regarding your invoice.   Our billing staff will not be able to assist you with questions regarding bills from these companies.  You will be contacted with the lab results as soon as they are available. The fastest way to get your results is to activate your My Chart account. Instructions are located on the last page of this paperwork. If you have not heard from [place] regarding the results in 2 weeks, please contact this office.     We recommend that you schedule a mammogram for breast cancer screening. Typically, you do not need a referral to do this. Please contact a local imaging center to schedule your mammogram.  [facility] - [phone]  *ask for the Radiology Department [facility]) - [phone] or [phone]  [facility] - [phone] [facility] - [phone] [facility] - [phone]  *ask for the Radiology Department [facility] - [phone]  *ask for the Radiology Department [facility] [place] - [phone]  *ask for the Mammography Department [facility] - [phone]

## 2016-11-03 NOTE — Patient Instructions (Addendum)
     IF you received an x-ray today, you will receive an invoice from [facility]. Please contact [facility] at [phone] with questions or concerns regarding your invoice.   IF you received labwork today, you will receive an invoice from [facility]. Please contact [facility] at [phone] with questions or concerns regarding your invoice.   Our billing staff will not be able to assist you with questions regarding bills from these companies.  You will be contacted with the lab results as soon as they are available. The fastest way to get your results is to activate your My Chart account. Instructions are located on the last page of this paperwork. If you have not heard from us regarding the results in 2 weeks, please contact this office.    We recommend that you schedule a mammogram for breast cancer screening. Typically, you do not need a referral to do this. Please contact a local imaging center to schedule your mammogram.  [facility] - [phone]  *ask for the Radiology Department [facility] - [phone] or [phone]  [facility] - [phone] [facility] - [phone] [facility] - [phone]  *ask for the Radiology Department  [facility] - [phone]  *ask for the Radiology Department [facility] - [phone]  *ask for the Mammography Department [facility] - [phone] 

## 2016-11-04 LAB — COMPREHENSIVE METABOLIC PANEL
ALBUMIN: 4.3 g/dL (ref 3.5–5.5)
ALK PHOS: 78 IU/L (ref 39–117)
ALT: 20 IU/L (ref 0–32)
AST: 15 IU/L (ref 0–40)
Albumin/Globulin Ratio: 1.7 (ref 1.2–2.2)
BUN / CREAT RATIO: 20 (ref 9–23)
BUN: 11 mg/dL (ref 6–24)
Bilirubin Total: 0.2 mg/dL (ref 0.0–1.2)
CALCIUM: 8.9 mg/dL (ref 8.7–10.2)
CO2: 27 mmol/L (ref 18–29)
CREATININE: 0.56 mg/dL — AB (ref 0.57–1.00)
Chloride: 101 mmol/L (ref 96–106)
GFR calc Af Amer: 121 mL/min/{1.73_m2} (ref >59)
GFR, EST NON AFRICAN AMERICAN: 105 mL/min/{1.73_m2} (ref >59)
GLOBULIN, TOTAL: 2.6 g/dL (ref 1.5–4.5)
GLUCOSE: 78 mg/dL (ref 65–99)
Potassium: 3.3 mmol/L — ABNORMAL LOW (ref 3.5–5.2)
SODIUM: 143 mmol/L (ref 134–144)
Total Protein: 6.9 g/dL (ref 6.0–8.5)

## 2016-11-04 LAB — LIPID PANEL
CHOLESTEROL TOTAL: 209 mg/dL — AB (ref 100–199)
Chol/HDL Ratio: 3.9 ratio units (ref 0.0–4.4)
HDL: 54 mg/dL (ref >39)
LDL Calculated: 103 mg/dL — ABNORMAL HIGH (ref 0–99)
Triglycerides: 259 mg/dL — ABNORMAL HIGH (ref 0–149)
VLDL CHOLESTEROL CAL: 52 mg/dL — AB (ref 5–40)

## 2016-11-04 LAB — VITAMIN B12: VITAMIN B 12: 497 pg/mL (ref 232–1245)

## 2016-11-04 LAB — HEMOGLOBIN A1C
Est. average glucose Bld gHb Est-mCnc: 117 mg/dL
HEMOGLOBIN A1C: 5.7 % — AB (ref 4.8–5.6)

## 2016-11-04 LAB — MICROALBUMIN, URINE: MICROALBUM., U, RANDOM: 5.1 ug/mL

## 2016-11-04 LAB — VITAMIN D 25 HYDROXY (VIT D DEFICIENCY, FRACTURES): VIT D 25 HYDROXY: 11.8 ng/mL — AB (ref 30.0–100.0)

## 2016-11-04 NOTE — Progress Notes (Signed)
[facility] no longer preforms bone density exams. The only [facility] locations that do are the [facility] ([place], [place], [place]) and [facility] in [place]. We typically send these to [facility] or [facility].

## 2016-11-05 ENCOUNTER — Other Ambulatory Visit: Payer: Self-pay | Admitting: Family Medicine

## 2016-11-05 MED ORDER — VITAMIN D (ERGOCALCIFEROL) 1.25 MG (50000 UNIT) PO CAPS: 50000.0000 [IU] | ORAL_CAPSULE | ORAL | 0 refills | Status: DC

## 2016-11-05 NOTE — Progress Notes (Signed)
Vit d sent in 

## 2016-11-12 ENCOUNTER — Other Ambulatory Visit: Payer: Self-pay | Admitting: Family Medicine

## 2016-11-12 MED ORDER — CHOLECALCIFEROL 50 MCG (2000 UT) PO TABS: 2000.0000 [IU] | ORAL_TABLET | Freq: Every day | ORAL | 6 refills | Status: DC

## 2016-11-12 NOTE — Progress Notes (Signed)
it

## 2016-11-18 ENCOUNTER — Ambulatory Visit (INDEPENDENT_AMBULATORY_CARE_PROVIDER_SITE_OTHER): Payer: BLUE CROSS/BLUE SHIELD

## 2016-11-18 ENCOUNTER — Ambulatory Visit (INDEPENDENT_AMBULATORY_CARE_PROVIDER_SITE_OTHER): Payer: BLUE CROSS/BLUE SHIELD | Admitting: Physician Assistant

## 2016-11-18 VITALS — BP 185/82 | HR 70 | Temp 97.7°F | Resp 16 | Ht 60.0 in | Wt 148.2 lb

## 2016-11-18 DIAGNOSIS — M542 Cervicalgia: Secondary | ICD-10-CM

## 2016-11-18 LAB — POCT CBC
Granulocyte percent: 62.4 %G (ref 37–80)
HCT, POC: 38.2 % (ref 37.7–47.9)
HEMOGLOBIN: 13.6 g/dL (ref 12.2–16.2)
LYMPH, POC: 2.5 (ref 0.6–3.4)
MCH: 27.8 pg (ref 27–31.2)
MCHC: 35.6 g/dL — AB (ref 31.8–35.4)
MCV: 78.1 fL — AB (ref 80–97)
MID (cbc): 0.3 (ref 0–0.9)
MPV: 8.6 fL (ref 0–99.8)
PLATELET COUNT, POC: 244 10*3/uL (ref 142–424)
POC Granulocyte: 4.7 (ref 2–6.9)
POC LYMPH PERCENT: 33.8 %L (ref 10–50)
POC MID %: 3.8 % (ref 0–12)
RBC: 4.89 M/uL (ref 4.04–5.48)
RDW, POC: 13.5 %
WBC: 7.5 10*3/uL (ref 4.6–10.2)

## 2016-11-18 MED ORDER — HYDROCODONE-ACETAMINOPHEN 5-325 MG PO TABS: 1.0000 | ORAL_TABLET | Freq: Four times a day (QID) | ORAL | 0 refills | Status: DC | PRN

## 2016-11-18 NOTE — Progress Notes (Signed)
11/18/2016 12:33 PM   DOB: July 31, 1961 / MRN: [id number]  SUBJECTIVE:  Pam Hart is a 56 y.o. female presenting for neck pain that started two days ago.  She has a history of cervical arthritis however she reports that this is different. She does associates some bilateral shoulder pain. She reports that the pain radiates to her jaw. She denies fever, chills, photophobia.  She has a history of uncontrolled HTN.    She has No Known Allergies.   She  has a past medical history of Anemia; Arthritis; Hypertension; and Thyroid disease.    She  reports that she has never smoked. She has never used smokeless tobacco. She reports that she does not drink alcohol or use drugs. She  has no sexual activity history on file. The patient  has a past surgical history that includes Appendectomy and Eye surgery (Right, 2011).  Her family history includes Alzheimer's disease in her father; Arthritis in her maternal aunt; Diabetes in her sister; Glaucoma in her father; Heart disease in her mother; Hypertension in her mother and sister.  Review of Systems  Constitutional: Negative for chills and fever.  Musculoskeletal: Positive for myalgias and neck pain. Negative for back pain, falls and joint pain.  Skin: Negative for rash.    The problem list and medications were reviewed and updated by myself where necessary and exist elsewhere in the encounter.   OBJECTIVE:  BP (!) 185/82   Pulse 70   Temp 97.7 F (36.5 C) (Oral)   Resp 16   Ht 5' (1.524 m)   Wt 148 lb 3.2 oz (67.2 kg)   SpO2 100%   BMI 28.94 kg/m   BP Readings from Last 3 Encounters:  11/18/16 (!) 185/82  11/03/16 (!) 160/84  08/07/16 (!) 144/80   EKG: NSR with normal axis.  No evidence of hypertrophy, ischemia or infarction.    Physical Exam  Constitutional: She is oriented to person, place, and time. She appears well-developed and well-nourished.  Cardiovascular: Normal rate, regular rhythm and normal heart sounds.   No murmur  heard. Pulmonary/Chest: Effort normal and breath sounds normal. No respiratory distress. She has no wheezes.  Musculoskeletal: Normal range of motion. She exhibits tenderness.  Neurological: She is alert and oriented to person, place, and time. She has normal strength and normal reflexes. No cranial nerve deficit or sensory deficit. Gait normal. GCS eye subscore is 4. GCS verbal subscore is 5. GCS motor subscore is 6.  Negative Brudzinski's and Kernig's.  Negative for hyperreflexia.     Lab Results  Component Value Date   CHOL 209 (H) 11/03/2016   HDL 54 11/03/2016   LDLCALC 103 (H) 11/03/2016   TRIG 259 (H) 11/03/2016   CHOLHDL 3.9 11/03/2016   Lab Results  Component Value Date   CREATININE 0.56 (L) 11/03/2016   BUN 11 11/03/2016   NA 143 11/03/2016   K 3.3 (L) 11/03/2016   CL 101 11/03/2016   CO2 27 11/03/2016   Lab Results  Component Value Date   ALT 20 11/03/2016   AST 15 11/03/2016   ALKPHOS 78 11/03/2016   BILITOT 0.2 11/03/2016   Results for orders placed or performed in visit on 11/18/16 (from the past 72 hour(s))  POCT CBC     Status: Abnormal   Collection Time: 11/18/16 12:31 PM  Result Value Ref Range   WBC 7.5 4.6 - 10.2 K/uL   Lymph, poc 2.5 0.6 - 3.4   POC LYMPH PERCENT 33.8 10 -  50 %L   MID (cbc) 0.3 0 - 0.9   POC MID % 3.8 0 - 12 %M   POC Granulocyte 4.7 2 - 6.9   Granulocyte percent 62.4 37 - 80 %G   RBC 4.89 4.04 - 5.48 M/uL   Hemoglobin 13.6 12.2 - 16.2 g/dL   HCT, POC 82.938.2 56.237.7 - 47.9 %   MCV 78.1 (A) 80 - 97 fL   MCH, POC 27.8 27 - 31.2 pg   MCHC 35.6 (A) 31.8 - 35.4 g/dL   RDW, POC 13.013.5 %   Platelet Count, POC 244 142 - 424 K/uL   MPV 8.6 0 - 99.8 fL   Lab Results  Component Value Date   HGBA1C 5.7 (H) 11/03/2016     Dg Cervical Spine 2 Or 3 Views  Result Date: 11/18/2016 CLINICAL DATA:  Neck pain for 2 days EXAM: CERVICAL SPINE - 2-3 VIEW COMPARISON:  None. FINDINGS: There is no evidence of cervical spine fracture or prevertebral soft  tissue swelling. Alignment is normal. No other significant bone abnormalities are identified. Bilateral facet arthropathy at C3-4 and C4-5. IMPRESSION: No acute osseous injury of the cervical spine. Electronically Signed   By: Elige KoHetal  Patel   On: 11/18/2016 12:08   ASSESSMENT AND PLAN  Jeri was seen today for neck pain.  Diagnoses and all orders for this visit:  Neck pain: I don't know why her neck is hurting today.  Her EKG is normal and her rads are showing some arthropathy.  Neurologically she is normal. I would like to give her a shot of toradol however her BP is too high, and she tells me that she has BP medication and will go home and take it. This will either resolve with time or she will be coming back in three days for further evaluation.  I am intentionally avoiding NSAIDS due to her BP.  Tylenol will likely do little to help as she reports tramadol and muscle relaxer has not helped. We will have to see how this evolves. Prednisone may be a good option if she is not improving and I thought about that today for her however she is not having any real significant radiculopathy.  -     EKG 12-Lead -     DG Cervical Spine 2 or 3 views; Future -     POCT CBC    The patient is advised to call or return to clinic if she does not see an improvement in symptoms, or to seek the care of the closest emergency department if she worsens with the above plan.   Deliah BostonMichael Gentry Pilson, MHS, PA-C [facility] 11/18/2016 12:33 PM

## 2016-11-18 NOTE — Patient Instructions (Signed)
     IF you received an x-ray today, you will receive an invoice from [facility]. Please contact [facility] at [phone] with questions or concerns regarding your invoice.   IF you received labwork today, you will receive an invoice from [facility]. Please contact [facility] at [phone] with questions or concerns regarding your invoice.   Our billing staff will not be able to assist you with questions regarding bills from these companies.  You will be contacted with the lab results as soon as they are available. The fastest way to get your results is to activate your My Chart account. Instructions are located on the last page of this paperwork. If you have not heard from us regarding the results in 2 weeks, please contact this office.     

## 2016-11-19 ENCOUNTER — Encounter (HOSPITAL_COMMUNITY): Payer: Self-pay | Admitting: Emergency Medicine

## 2016-11-19 ENCOUNTER — Emergency Department (HOSPITAL_COMMUNITY)
Admission: EM | Admit: 2016-11-19 | Discharge: 2016-11-19 | Disposition: A | Discharge status: Home / Self Care | Payer: BLUE CROSS/BLUE SHIELD | Attending: Emergency Medicine | Admitting: Emergency Medicine

## 2016-11-19 ENCOUNTER — Emergency Department (HOSPITAL_COMMUNITY): Payer: BLUE CROSS/BLUE SHIELD

## 2016-11-19 DIAGNOSIS — I10 Essential (primary) hypertension: Secondary | ICD-10-CM | POA: Diagnosis not present

## 2016-11-19 DIAGNOSIS — R51 Headache: Secondary | ICD-10-CM | POA: Insufficient documentation

## 2016-11-19 DIAGNOSIS — M542 Cervicalgia: Secondary | ICD-10-CM | POA: Diagnosis present

## 2016-11-19 DIAGNOSIS — Z79899 Other long term (current) drug therapy: Secondary | ICD-10-CM | POA: Insufficient documentation

## 2016-11-19 DIAGNOSIS — M436 Torticollis: Secondary | ICD-10-CM | POA: Insufficient documentation

## 2016-11-19 MED ORDER — DIAZEPAM 5 MG/ML IJ SOLN
5.0000 mg | Freq: Once | INTRAMUSCULAR | Status: AC
  Administered 2016-11-19: 5 mg via INTRAMUSCULAR
  Filled 2016-11-19: qty 2

## 2016-11-19 MED ORDER — PREDNISONE 20 MG PO TABS: 40.0000 mg | ORAL_TABLET | Freq: Every day | ORAL | 0 refills | Status: DC

## 2016-11-19 MED ORDER — OXYCODONE-ACETAMINOPHEN 5-325 MG PO TABS: 1.0000 | ORAL_TABLET | ORAL | 0 refills | Status: DC | PRN

## 2016-11-19 MED ORDER — PREDNISONE 20 MG PO TABS
60.0000 mg | ORAL_TABLET | Freq: Once | ORAL | Status: AC
  Administered 2016-11-19: 60 mg via ORAL
  Filled 2016-11-19: qty 3

## 2016-11-19 MED ORDER — METHOCARBAMOL 500 MG PO TABS: 500.0000 mg | ORAL_TABLET | Freq: Two times a day (BID) | ORAL | 0 refills | Status: DC

## 2016-11-19 NOTE — ED Notes (Signed)
Getting patient ready for discharge 

## 2016-11-19 NOTE — ED Notes (Signed)
Tatyana PA at bedside   

## 2016-11-19 NOTE — ED Triage Notes (Addendum)
Patient reports persistent pain at posterior neck radiating down to both shoulders and upper back onset 3 days ago , denies injury or fall , she was seen [facility] discharged home with prescription Hydrocodone . Pain increases with palpation and movement .

## 2016-11-19 NOTE — Discharge Instructions (Signed)
Continue robaxin for spasms. Try heating pads, stretches. Take percocet for severe pain. Do not take together with vicodin, because they are similar medications. Prednisone until all gone. Follow up with primary care doctor in two days.

## 2016-11-19 NOTE — ED Provider Notes (Signed)
[facility]-EMERGENCY DEPT Provider Note   CSN: [id number] Arrival date & time: 11/19/16  [id number]  By signing my name below, I, Majel HomerPeyton Lee, attest that this documentation has been prepared under the direction and in the presence of  Trevonn Hallum, PA-C. Electronically Signed: Majel HomerPeyton Lee, Scribe. 11/19/2016. 9:36 AM.  History   Chief Complaint Chief Complaint  Patient presents with  . Neck Pain  . Back Pain  . Shoulder Pain   The history is provided by the patient. No language interpreter was used.   HPI Comments: Pam Hart is a 56 y.o. female with PMHx of arthritis and HTN, who presents to the Emergency Department complaining of gradually worsening, constant, "sharp," posterior neck pain that began 2 days ago. Pt reports her pain radiates into her bilateral shoulders and into her upper back.  She notes her pain is exacerbated with movement of her head and "resting her head on a pillow." She states associated posterior and temporal headache. She notes she visited [facility] yesterday for similar symptoms and was prescribed hydrocodone and muscle relaxants which she took yesterday and this morning with no relief. She states she has also tried stretching exercises with no relief. Pt denies recent falls, injury to her neck or head, fever, nausea, and vomiting.  No visual changes  Past Medical History:  Diagnosis Date  . Anemia   . Arthritis   . Hypertension   . Thyroid disease     Patient Active Problem List   Diagnosis Date Noted  . Thyroid nodule 07/29/2016  . Neck pain 10/29/2015  . Hypertension, uncontrolled 08/22/2015  . Chest pain with moderate risk of acute coronary syndrome 08/22/2015  . GERD (gastroesophageal reflux disease) 08/22/2015  . Hyperlipidemia 03/17/2013  . FATIGUE 08/05/2007    Past Surgical History:  Procedure Laterality Date  . APPENDECTOMY    . EYE SURGERY Right 2011   lasix     OB History    No data available     Home Medications    Prior to  Admission medications   Medication Sig Start Date End Date Taking? Authorizing Provider  amLODipine (NORVASC) 5 MG tablet Take 1 tablet (5 mg total) by mouth daily. 10/24/15   Dorna LeitzNicole V Bush, PA-C  Cholecalciferol 2000 units TABS Take 1 tablet (2,000 Units total) by mouth daily. 11/12/16   Doristine BosworthZoe A Stallings, MD  HYDROcodone-acetaminophen (NORCO) 5-325 MG tablet Take 1-1.5 tablets by mouth every 6 (six) hours as needed for severe pain (May cause constipation). For severe pain only. 11/18/16   Ofilia NeasMichael L Clark, PA-C  lisinopril (PRINIVIL,ZESTRIL) 10 MG tablet Take 1 tablet (10 mg total) by mouth daily. 10/24/15   Dorna LeitzNicole V Bush, PA-C  meloxicam (MOBIC) 15 MG tablet  08/04/16   Historical Provider, MD  methocarbamol (ROBAXIN) 500 MG tablet Take 1 tablet (500 mg total) by mouth every 6 (six) hours as needed for muscle spasms. 10/24/15   Dorna LeitzNicole V Bush, PA-C  omeprazole (PRILOSEC) 20 MG capsule Take 1 capsule (20 mg total) by mouth daily. 11/03/16   Doristine BosworthZoe A Stallings, MD  traMADol (ULTRAM) 50 MG tablet Take 1 tablet (50 mg total) by mouth every 8 (eight) hours as needed. 11/03/16   Doristine BosworthZoe A Stallings, MD  Vitamin D, Ergocalciferol, (DRISDOL) 50000 units CAPS capsule Take 1 capsule (50,000 Units total) by mouth every 7 (seven) days. 11/05/16   Doristine BosworthZoe A Stallings, MD    Family History Family History  Problem Relation Age of Onset  . Arthritis Maternal Aunt   .  Hypertension Mother   . Heart disease Mother   . Alzheimer's disease Father   . Glaucoma Father   . Diabetes Sister   . Hypertension Sister   . Colon cancer Neg Hx   . Colon polyps Neg Hx   . Esophageal cancer Neg Hx   . Kidney disease Neg Hx   . Gallbladder disease Neg Hx     Social History Social History  Substance Use Topics  . Smoking status: Never Smoker  . Smokeless tobacco: Never Used  . Alcohol use No     Allergies   Patient has no known allergies.   Review of Systems Review of Systems  Constitutional: Negative for fever.    Gastrointestinal: Negative for nausea and vomiting.  Musculoskeletal: Positive for arthralgias, back pain and neck pain.  Neurological: Positive for headaches.   Physical Exam Updated Vital Signs BP 159/69 (BP Location: Left Arm)   Pulse 74   Temp 97.5 F (36.4 C) (Oral)   Resp 16   SpO2 100%   Physical Exam  Constitutional: She is oriented to person, place, and time. She appears well-developed and well-nourished.  HENT:  Head: Normocephalic and atraumatic.  Eyes: Conjunctivae and EOM are normal. Pupils are equal, round, and reactive to light.  Neck: Normal range of motion.  No midline cervical spine tenderness. Bilateral paravertebral tenderness, tenderness over bilateral trapezius and sternocleidomastoid. Pain with any range of motion of the neck.  Cardiovascular: Normal rate and regular rhythm.   Pulmonary/Chest: Effort normal and breath sounds normal.  Abdominal: She exhibits no distension.  Musculoskeletal: Normal range of motion.  Full range of motion bilateral lower and upper extremities. Strength is 5 out of 5 and equal bilaterally. Grip strength is 5 out of 5 and equal.  Neurological: She is alert and oriented to person, place, and time. She displays normal reflexes. No sensory deficit. She exhibits normal muscle tone.  Skin: Skin is warm. Capillary refill takes less than 2 seconds.  Psychiatric: She has a normal mood and affect.  Nursing note and vitals reviewed.  ED Treatments / Results  Labs (all labs ordered are listed, but only abnormal results are displayed) Labs Reviewed - No data to display  EKG  EKG Interpretation None      Radiology Dg Cervical Spine 2 Or 3 Views  Result Date: 11/18/2016 CLINICAL DATA:  Neck pain for 2 days EXAM: CERVICAL SPINE - 2-3 VIEW COMPARISON:  None. FINDINGS: There is no evidence of cervical spine fracture or prevertebral soft tissue swelling. Alignment is normal. No other significant bone abnormalities are identified. Bilateral  facet arthropathy at C3-4 and C4-5. IMPRESSION: No acute osseous injury of the cervical spine. Electronically Signed   By: Elige Ko   On: 11/18/2016 12:08   Ct Head Wo Contrast  Result Date: 11/19/2016 CLINICAL DATA:  Persistent posterior neck pain radiating into both shoulders for the last 3 days. Associated headache. EXAM: CT HEAD WITHOUT CONTRAST CT CERVICAL SPINE WITHOUT CONTRAST TECHNIQUE: Multidetector CT imaging of the head and cervical spine was performed following the standard protocol without intravenous contrast. Multiplanar CT image reconstructions of the cervical spine were also generated. COMPARISON:  None. FINDINGS: CT HEAD FINDINGS Brain: There is no evidence for acute hemorrhage, hydrocephalus, mass lesion, or abnormal extra-axial fluid collection. No definite CT evidence for acute infarction. Vascular: No hyperdense vessel or unexpected calcification. Skull: No evidence for fracture. No worrisome lytic or sclerotic lesion. Sinuses/Orbits: Opacification posterior right ethmoid air cell noted with remaining visualized paranasal  sinuses and mastoid air cells clear. Visualized portions of the globes and intraorbital fat are unremarkable. Other: None. CT CERVICAL SPINE FINDINGS Alignment: Straightening of lordosis. No subluxation. Facets are well aligned bilaterally. Skull base and vertebrae: No evidence of fracture. Soft tissues and spinal canal: 1.4 cm right thyroid nodule noted. No prevertebral fluid or swelling. No visible canal hematoma. Disc levels: Intervertebral disc height preserved. Facet osteoarthritis noted on the left at C3-4. Upper chest: Unremarkable. Other: None. IMPRESSION: 1. Normal CT evaluation of the brain. 2. Opacification posterior right ethmoid air cell suggest chronic sinusitis. 3. 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 4. Loss of cervical lordosis. This can be related to patient positioning, muscle spasm or soft tissue injury. 5. 1.4 cm right thyroid nodule. This was  better evaluated by ultrasound on 08/06/2016. Electronically Signed   By: Kennith Center M.D.   On: 11/19/2016 10:50   Ct Cervical Spine Wo Contrast  Result Date: 11/19/2016 CLINICAL DATA:  Persistent posterior neck pain radiating into both shoulders for the last 3 days. Associated headache. EXAM: CT HEAD WITHOUT CONTRAST CT CERVICAL SPINE WITHOUT CONTRAST TECHNIQUE: Multidetector CT imaging of the head and cervical spine was performed following the standard protocol without intravenous contrast. Multiplanar CT image reconstructions of the cervical spine were also generated. COMPARISON:  None. FINDINGS: CT HEAD FINDINGS Brain: There is no evidence for acute hemorrhage, hydrocephalus, mass lesion, or abnormal extra-axial fluid collection. No definite CT evidence for acute infarction. Vascular: No hyperdense vessel or unexpected calcification. Skull: No evidence for fracture. No worrisome lytic or sclerotic lesion. Sinuses/Orbits: Opacification posterior right ethmoid air cell noted with remaining visualized paranasal sinuses and mastoid air cells clear. Visualized portions of the globes and intraorbital fat are unremarkable. Other: None. CT CERVICAL SPINE FINDINGS Alignment: Straightening of lordosis. No subluxation. Facets are well aligned bilaterally. Skull base and vertebrae: No evidence of fracture. Soft tissues and spinal canal: 1.4 cm right thyroid nodule noted. No prevertebral fluid or swelling. No visible canal hematoma. Disc levels: Intervertebral disc height preserved. Facet osteoarthritis noted on the left at C3-4. Upper chest: Unremarkable. Other: None. IMPRESSION: 1. Normal CT evaluation of the brain. 2. Opacification posterior right ethmoid air cell suggest chronic sinusitis. 3. 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 4. Loss of cervical lordosis. This can be related to patient positioning, muscle spasm or soft tissue injury. 5. 1.4 cm right thyroid nodule. This was better evaluated by ultrasound on  08/06/2016. Electronically Signed   By: Kennith Center M.D.   On: 11/19/2016 10:50    Procedures Procedures (including critical care time)  Medications Ordered in ED Medications - No data to display  DIAGNOSTIC STUDIES:  Oxygen Saturation is 100% on RA, normal by my interpretation.    COORDINATION OF CARE:  9:32 AM Discussed treatment plan with pt at bedside and pt agreed to plan.  Initial Impression / Assessment and Plan / ED Course  I have reviewed the triage vital signs and the nursing notes.  Pertinent labs & imaging results that were available during my care of the patient were reviewed by me and considered in my medical decision making (see chart for details).  Clinical Course     Patient in the emergency department with 2 days of bilateral neck pain, neck stiffness, headache. Headache onset gradual, doubt intracranial bleeding with normal neurological exam. Patient was prescribed hydrocodone and she is already taking Robaxin which has not helped. Patient admits she is worried she may have a tumor in her  brain, and requesting imaging of her head. We'll get CT head and cervical spine. She is neurovascular intact, doubt cord compression at this time. I reviewed her MRI from September which show some degenerative changes. Will try Valium IM and prednisone. She took hydrocodone prior to arrival which is not helping.  11:32 AM Imaging as described above. Pt states she is not feeling any better. Exam most consistent with muscle spasms. Pain worse with movement, sharp pain comes and goes with movement. No neurological symptoms or exam findings. Will add steroids, will switch to percocet for better pain control for few days. Follow up with pcp   Vitals:   11/19/16 0654 11/19/16 1202  BP: 159/69 141/69  Pulse: 74 70  Resp: 16 20  Temp: 97.5 F (36.4 C) 97.9 F (36.6 C)  TempSrc: Oral Oral  SpO2: 100% 98%    I personally performed the services described in this documentation,  which was scribed in my presence. The recorded information has been reviewed and is accurate.   Final Clinical Impressions(s) / ED Diagnoses   Final diagnoses:  Torticollis    New Prescriptions Discharge Medication List as of 11/19/2016 12:00 PM    START taking these medications   Details  oxyCODONE-acetaminophen (PERCOCET) 5-325 MG tablet Take 1 tablet by mouth every 4 (four) hours as needed for severe pain., Starting Wed 11/19/2016, Print    predniSONE (DELTASONE) 20 MG tablet Take 2 tablets (40 mg total) by mouth daily., Starting Wed 11/19/2016, Print         Jaynie Crumble, PA-C 11/19/16 1630    Alvira Monday, MD 11/22/16 1147

## 2016-11-19 NOTE — ED Notes (Signed)
..  Video visit coupon number and handout given to patient on discharge

## 2017-01-05 ENCOUNTER — Ambulatory Visit (INDEPENDENT_AMBULATORY_CARE_PROVIDER_SITE_OTHER): Payer: BLUE CROSS/BLUE SHIELD | Admitting: Family Medicine

## 2017-01-05 DIAGNOSIS — M25521 Pain in right elbow: Secondary | ICD-10-CM

## 2017-01-05 DIAGNOSIS — M79641 Pain in right hand: Secondary | ICD-10-CM

## 2017-01-05 MED ORDER — MELOXICAM 15 MG PO TABS: 15.0000 mg | ORAL_TABLET | Freq: Every day | ORAL | 0 refills | Status: DC

## 2017-01-05 NOTE — Patient Instructions (Addendum)
Thank you for coming in,   Please take the mobic for 10 days straight then as needed after that.   I have provided home exercises that you can try.   Please follow up with us in 3 weeks if your pain is still occurring.    Please feel free to call with any questions or concerns at any time, at [phone]. --Dr. Jordan LikesSchmitz    IF you received an x-ray today, you will receive an invoice from [facility]. Please contact [facility] at [phone] with questions or concerns regarding your invoice.   IF you received labwork today, you will receive an invoice from [facility]. Please contact [facility] at [phone] with questions or concerns regarding your invoice.   Our billing staff will not be able to assist you with questions regarding bills from these companies.  You will be contacted with the lab results as soon as they are available. The fastest way to get your results is to activate your My Chart account. Instructions are located on the last page of this paperwork. If you have not heard from us regarding the results in 2 weeks, please contact this office.

## 2017-01-05 NOTE — Assessment & Plan Note (Signed)
Pain over the thenar eminence also the CMC and MCP in that area.  Possible to really do any underlying arthritis althoughan x-ray from 2013 was not significant for that. - Started Mobic - Advised to follow-up in 2-3 weeks.  If still having pain can consider x-rays as well as a CMC injection.

## 2017-01-05 NOTE — Assessment & Plan Note (Signed)
Most likely her pain is lateral epicondylitis but this is significantly acute in nature.  She does not do any repetitive motion as a source. - Started Mobic - Provided home exercises - if still having pain then advised to follow-up in 2-3 weeks can consider physical therapy at that time.

## 2017-01-05 NOTE — Progress Notes (Signed)
  Stephens NovemberHanan M Neiswonger - 56 y.o. female MRN [id number]  Date of birth: 09/16/1961  SUBJECTIVE:  Including CC & ROS.  Chief Complaint  Patient presents with  . Arm Pain    pain in both arms; worse in right arm; ongoing issue   Ms. Passe is a 56 yo is presenting with lateral elbow pain and right thumb pain. The pain started yesterday on her lateral elbow. She hasn't tried anything. 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 She denies any prior pain in her elbow. The pain is sharp. The pain is localized.   Reports the pain at the base of her thumb for one week. She reports the pain is worse. She has tramadol and mobic that she takes as needed. She has had carpal tunnel release in both arms.   ROS: No unexpected weight loss, fever, chills, swelling, instability, muscle pain, numbness/tingling, redness, otherwise see HPI     HISTORY: Past Medical, Surgical, Social, and Family History Reviewed & Updated per EMR.   Pertinent Historical Findings include: PMSHx -  Anemia, arthritis, carpal tunnel release b/l   PSHx -  No tobacco or alcohol use  FHx -  HTN  DATA REVIEWED: 02/17/2012: right hand: no abnormality   PHYSICAL EXAM:  VS: BP:(!) 150/70  HR:80bpm  TEMP:98 F (36.7 C)(Oral)  RESP:99 %  HT:5' (152.4 cm)   WT:147 lb 6.4 oz (66.9 kg)  BMI:28.8 PHYSICAL EXAM: Gen: NAD, alert, cooperative with exam, well-appearing HEENT: clear conjunctiva, EOMI CV:  no edema, capillary refill brisk,  Resp: non-labored, normal speech Skin: no rashes, normal turgor  Neuro: no gross deficits.  Psych:  alert and oriented Right elbow/wrist/hand: Some tenderness to palpation of the lateral upper condyle. No tenderness to palpation over the olecranon or medial epicondyle. No obvious effusion or ecchymosis. No tenderness to palpation over the common extensors or common flexors Some pain with resistance to supination. Normal finger abduction and abduction. Some tenderness palpation over the thenar  eminence. Negative grind test. Normal thumb opposition, abduction, and extension no pain with resistance to middle finger extension Normal wrist range of motion    ASSESSMENT & PLAN:   Right elbow pain Most likely her pain is lateral epicondylitis but this is significantly acute in nature.  She does not do any repetitive motion as a source. - Started Mobic - Provided home exercises - if still having pain then advised to follow-up in 2-3 weeks can consider physical therapy at that time.  Right hand pain Pain over the thenar eminence also the CMC and MCP in that area.  Possible to really do any underlying arthritis althoughan x-ray from 2013 was not significant for that. - Started Mobic - Advised to follow-up in 2-3 weeks.  If still having pain can consider x-rays as well as a CMC injection.

## 2017-03-10 ENCOUNTER — Other Ambulatory Visit: Payer: Self-pay | Admitting: Emergency Medicine

## 2017-03-10 DIAGNOSIS — I10 Essential (primary) hypertension: Secondary | ICD-10-CM

## 2017-03-10 MED ORDER — AMLODIPINE BESYLATE 5 MG PO TABS: 5.0000 mg | ORAL_TABLET | Freq: Every day | ORAL | 3 refills | Status: DC

## 2017-03-10 MED ORDER — LISINOPRIL 10 MG PO TABS: 10.0000 mg | ORAL_TABLET | Freq: Every day | ORAL | 3 refills | Status: DC

## 2017-04-20 ENCOUNTER — Other Ambulatory Visit: Payer: Self-pay | Admitting: Family Medicine

## 2017-04-28 ENCOUNTER — Other Ambulatory Visit: Payer: Self-pay | Admitting: *Deleted

## 2017-04-28 IMAGING — US US SOFT TISSUE HEAD/NECK
1 series · 13 of 25 positions shown · non-contrast
Comparison: MRI of July 22, 2016.

CLINICAL DATA: Thyroid nodule seen on MRI.

EXAM:
THYROID ULTRASOUND
TECHNIQUE: Ultrasound examination of the thyroid gland and adjacent soft
tissues was performed.

[Series 1: us soft tissue head/neck · 0.06mm/px · 13 of 40 slices shown]
[im 1/40]
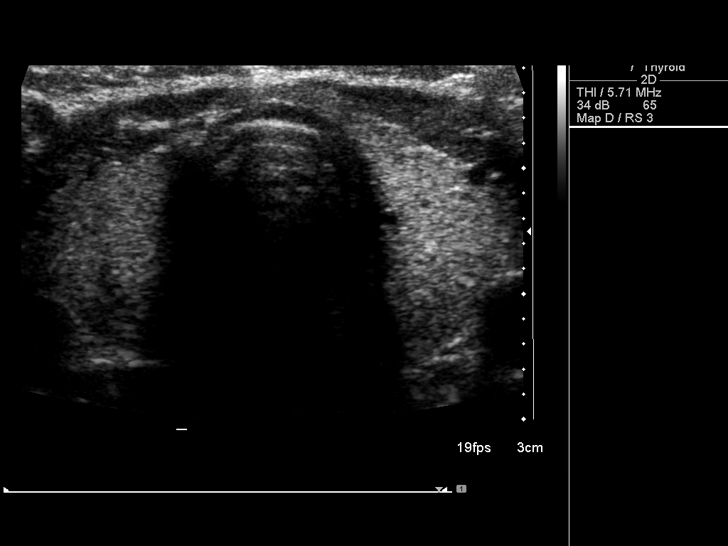
[im 4/40]
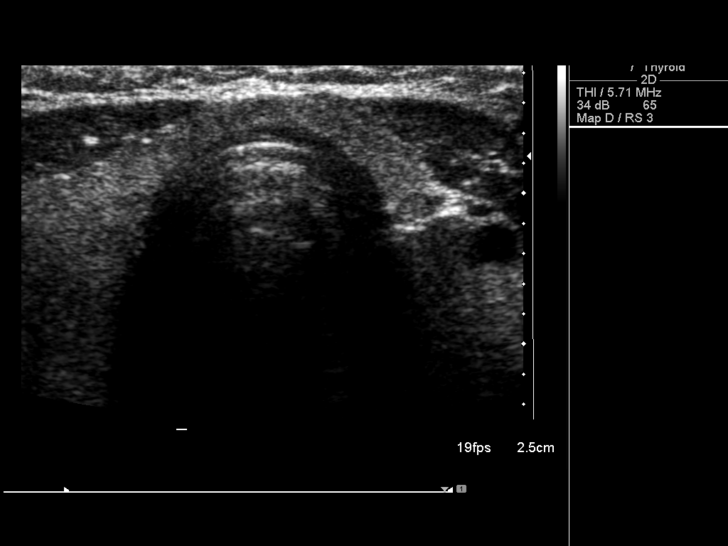
[im 7/40]
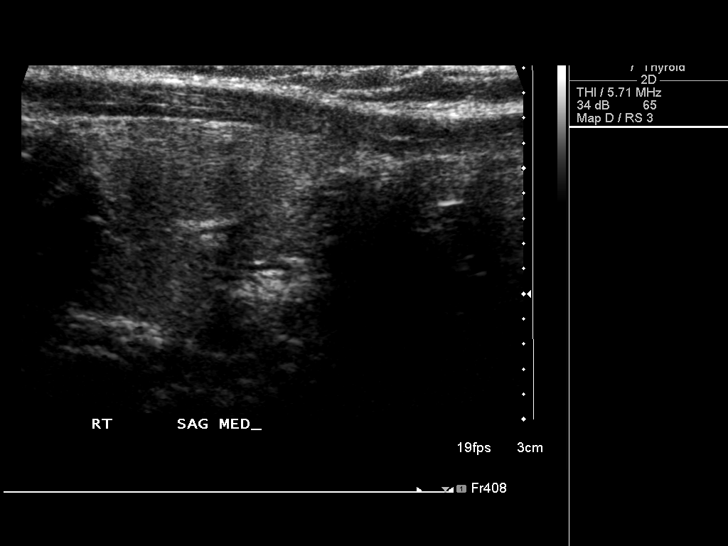
[im 10/40]
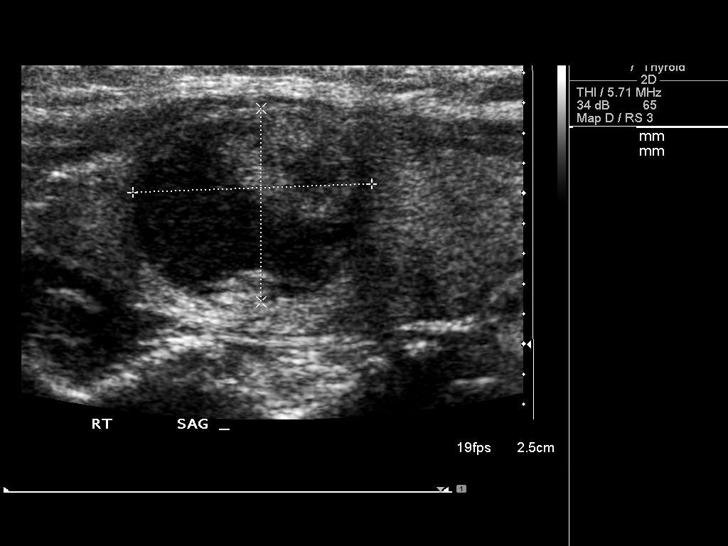
[im 14/40]
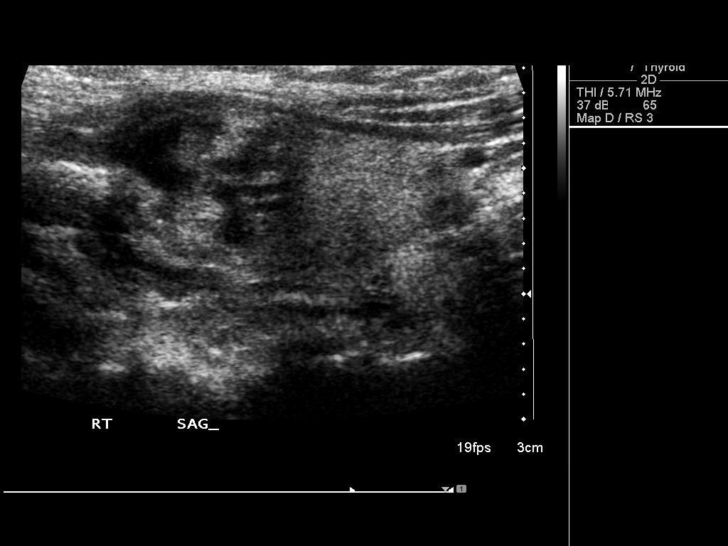
[im 17/40]
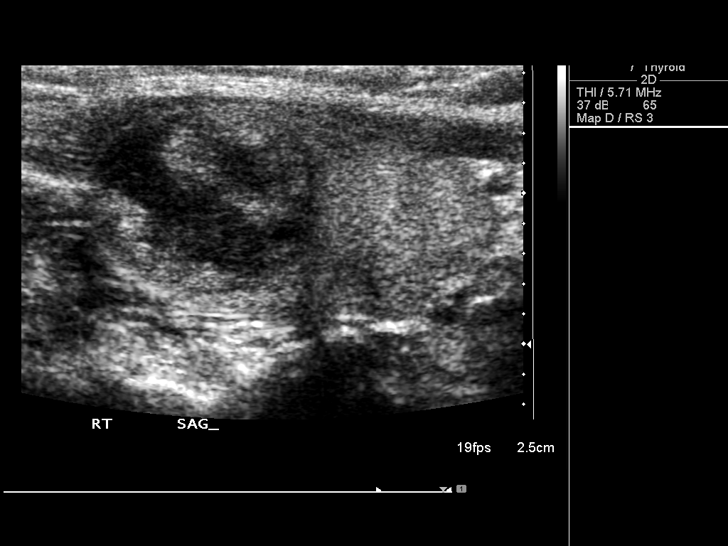
[im 20/40]
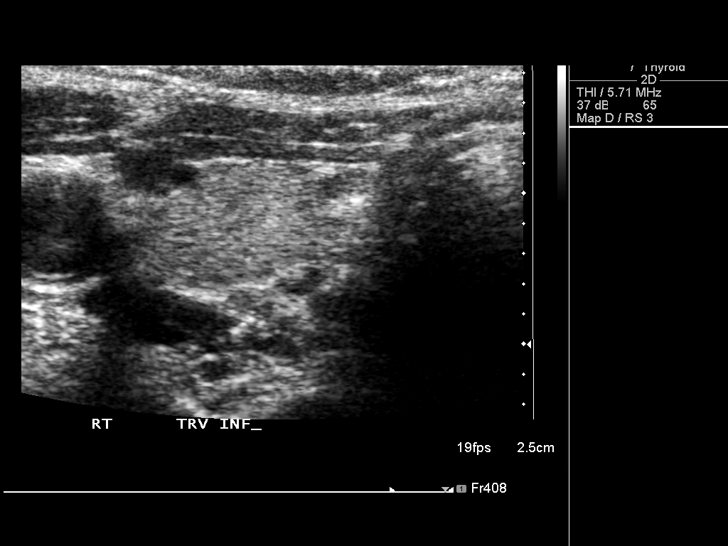
[im 23/40]
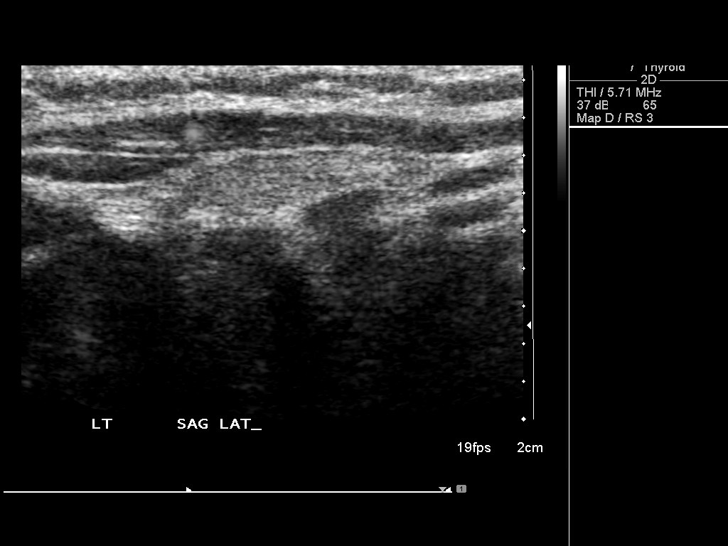
[im 27/40]
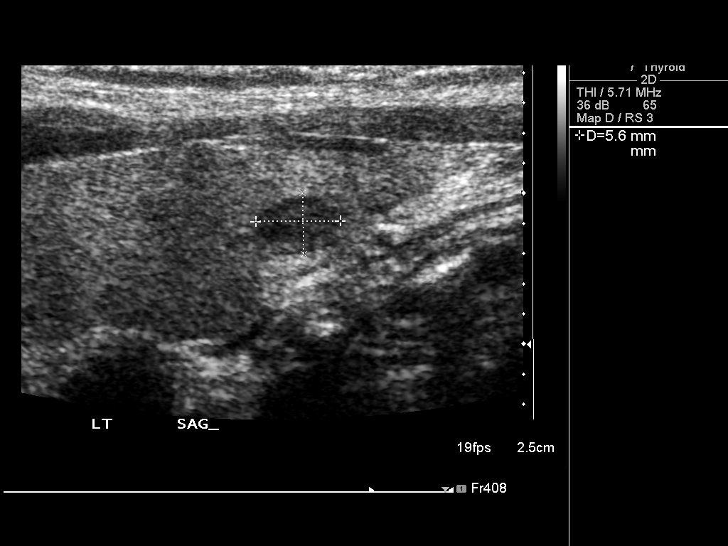
[im 30/40]
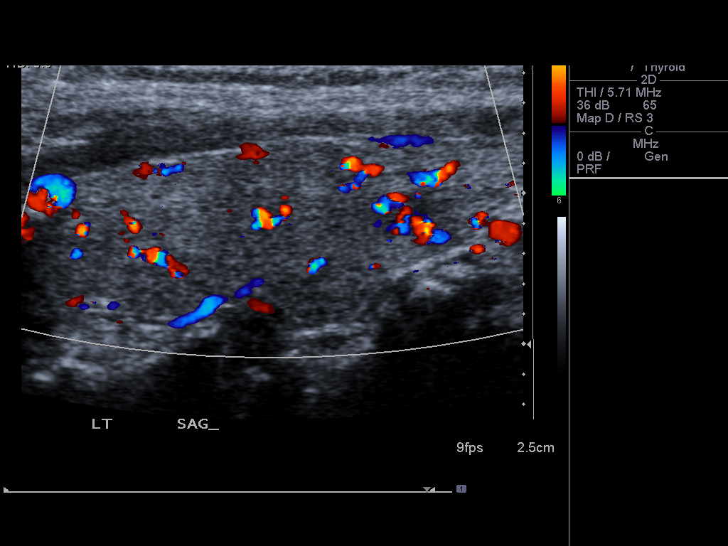
[im 33/40]
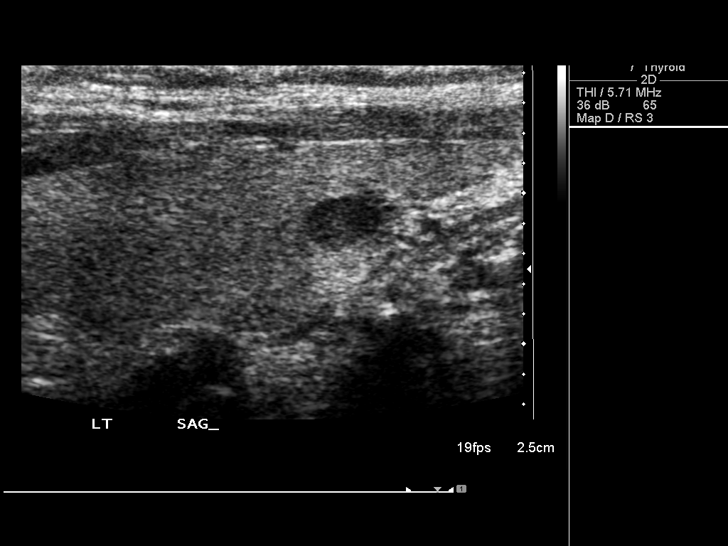
[im 36/40]
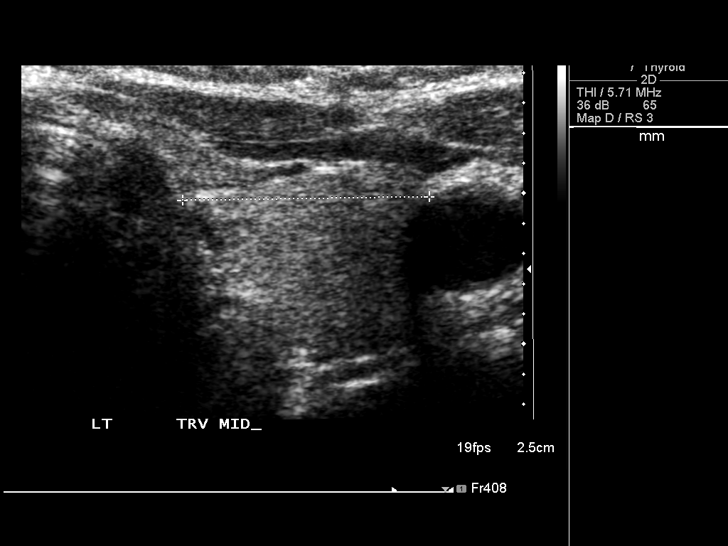
[im 40/40]
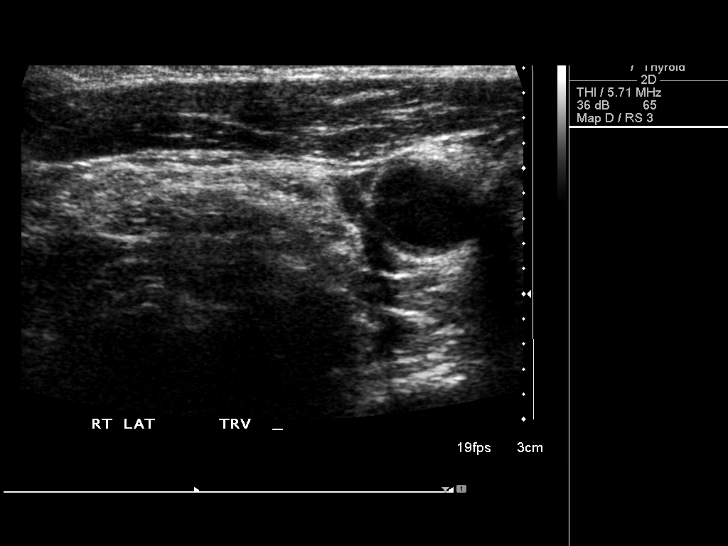

[13 of 25 positions shown; findings below may reference images not displayed]

FINDINGS: Parenchymal Echotexture: Mildly heterogenous

Estimated total number of nodules >/= 1 cm: 1

Number of spongiform nodules > 2 cm not described below (TR1): 0

Number of mixed cystic and solid nodules > 1.5 cm not described
below (TR2): 0

_________________________________________________________

Isthmus: 0.2 cm.

No discrete nodules are identified within the thyroid isthmus.

_________________________________________________________

Right lobe: 3.6 x 1.7 x 1.6 cm.

Nodule # 1:

Location: Right; superior

Size: 1.6 x 1.3 x 1.3 cm.

Composition: mixed cystic and solid (1)

Echogenicity: isoechoic (1)

Shape: not taller-than-wide (0)

Margins: smooth (0)

Echogenic foci: none (0)

ACR TI-RADS total points: 2.

ACR TI-RADS risk category: TR2 (2 points).

ACR TI-RADS recommendations:

This nodule does NOT meet TI-RADS criteria for biopsy or dedicated
follow-up.

_________________________________________________________

Left lobe: 3.7 x 1.6 x 1.4 cm.

Nodule # 1:

Location: Left; superior

Size: 0.8 x 0.5 x 0.5 cm.

Composition: cystic/almost completely cystic (0)

Echogenicity: anechoic (0)

Shape: not taller-than-wide (0)

Margins: smooth (0)

Echogenic foci: none (0)

ACR TI-RADS total points: 0.

ACR TI-RADS risk category: TR1 (0 points).

ACR TI-RADS recommendations:

This nodule does NOT meet TI-RADS criteria for biopsy or dedicated
follow-up.

Nodule # 2:

Location: Left; inferior

Size: 0.6 x 0.6 x 0.4 cm.

Composition: solid/almost completely solid (2)

Echogenicity: hypoechoic (2)

Shape: not taller-than-wide (0)

Margins: ill-defined (0)

Echogenic foci: none (0)

ACR TI-RADS total points: 4.

ACR TI-RADS risk category: TR4 (4-6 points).

ACR TI-RADS recommendations:

Given size (<0.9 cm) and appearance, this nodule does NOT meet
TI-RADS criteria for biopsy or dedicated follow-up.
IMPRESSION: Bilateral thyroid nodules which do not meet criteria for biopsy or
dedicated followup.

The above is in keeping with the ACR TI-RADS recommendations - [HOSPITAL] 3127;[DATE].

## 2017-04-28 MED ORDER — MELOXICAM 15 MG PO TABS: 15.0000 mg | ORAL_TABLET | Freq: Every day | ORAL | 0 refills | Status: DC

## 2017-08-19 ENCOUNTER — Encounter: Payer: Self-pay | Admitting: Physician Assistant

## 2017-08-19 ENCOUNTER — Ambulatory Visit (INDEPENDENT_AMBULATORY_CARE_PROVIDER_SITE_OTHER): Payer: BLUE CROSS/BLUE SHIELD | Admitting: Physician Assistant

## 2017-08-19 VITALS — BP 142/88 | HR 74 | Temp 99.1°F | Resp 16 | Ht 60.0 in | Wt 139.4 lb

## 2017-08-19 DIAGNOSIS — R070 Pain in throat: Secondary | ICD-10-CM

## 2017-08-19 DIAGNOSIS — J020 Streptococcal pharyngitis: Secondary | ICD-10-CM

## 2017-08-19 LAB — POCT RAPID STREP A (OFFICE): Rapid Strep A Screen: POSITIVE — AB

## 2017-08-19 MED ORDER — AMOXICILLIN 500 MG PO CAPS: 500.0000 mg | ORAL_CAPSULE | Freq: Two times a day (BID) | ORAL | 0 refills | Status: AC

## 2017-08-19 NOTE — Patient Instructions (Signed)
You can continue to gargle with salt water.  Please take the anitbiotics to completion.  This is important.  You can use tylenol or ibuprofen for pain or fever.  You can use cepacol lozenges for throat pain as well.  Strep Throat Strep throat is an infection of the throat. It is caused by germs. Strep throat spreads from person to person because of coughing, sneezing, or close contact. Follow these instructions at home: Medicines  Take over-the-counter and prescription medicines only as told by your doctor.  Take your antibiotic medicine as told by your doctor. Do not stop taking the medicine even if you feel better.  Have family members who also have a sore throat or fever go to a doctor. Eating and drinking  Do not share food, drinking cups, or personal items.  Try eating soft foods until your sore throat feels better.  Drink enough fluid to keep your pee (urine) clear or pale yellow. General instructions  Rinse your mouth (gargle) with a salt-water mixture 3-4 times per day or as needed. To make a salt-water mixture, stir -1 tsp of salt into 1 cup of warm water.  Make sure that all people in your house wash their hands well.  Rest.  Stay home from school or work until you have been taking antibiotics for 24 hours.  Keep all follow-up visits as told by your doctor. This is important. Contact a doctor if:  Your neck keeps getting bigger.  You get a rash, cough, or earache.  You cough up thick liquid that is green, yellow-brown, or bloody.  You have pain that does not get better with medicine.  Your problems get worse instead of getting better.  You have a fever. Get help right away if:  You throw up (vomit).  You get a very bad headache.  You neck hurts or it feels stiff.  You have chest pain or you are short of breath.  You have drooling, very bad throat pain, or changes in your voice.  Your neck is swollen or the skin gets red and tender.  Your mouth is  dry or you are peeing less than normal.  You keep feeling more tired or it is hard to wake up.  Your joints are red or they hurt. This information is not intended to replace advice given to you by your health care provider. Make sure you discuss any questions you have with your health care provider. Document Released: 04/21/2008 Document Revised: 07/02/2016 Document Reviewed: 02/26/2015 Elsevier Interactive Patient Education  [employer].

## 2017-08-19 NOTE — Progress Notes (Signed)
PRIMARY CARE AT [facility] [address], [place] [address] [phone]  Date:  08/19/2017   Name:  Pam Hart   DOB:  10-Feb-1961   MRN:  [id number]  PCP:  Patient, No Pcp Per    History of Present Illness:  Amely MALEAH RABAGO is a 56 y.o. female patient who presents to PCP with  Chief Complaint  Patient presents with  . Sore Throat     Patient states that she has intermittent sore throat for the last few months.  She no nasal congestion or cough.  She notes subjective fever and chills.  [place] with [place]x, but she never completed her course.    Patient Active Problem List   Diagnosis Date Noted  . Right elbow pain 01/05/2017  . Right hand pain 01/05/2017  . Thyroid nodule 07/29/2016  . Neck pain 10/29/2015  . Hypertension, uncontrolled 08/22/2015  . Chest pain with moderate risk of acute coronary syndrome 08/22/2015  . GERD (gastroesophageal reflux disease) 08/22/2015  . Hyperlipidemia 03/17/2013  . FATIGUE 08/05/2007    Past Medical History:  Diagnosis Date  . Anemia   . Arthritis   . Hypertension   . Thyroid disease     Past Surgical History:  Procedure Laterality Date  . APPENDECTOMY    . EYE SURGERY Right 2011   lasix     Social History  Substance Use Topics  . Smoking status: Never Smoker  . Smokeless tobacco: Never Used  . Alcohol use No    Family History  Problem Relation Age of Onset  . Arthritis Maternal Aunt   . Hypertension Mother   . Heart disease Mother   . Alzheimer's disease Father   . Glaucoma Father   . Diabetes Sister   . Hypertension Sister   . Colon cancer Neg Hx   . Colon polyps Neg Hx   . Esophageal cancer Neg Hx   . Kidney disease Neg Hx   . Gallbladder disease Neg Hx     No Known Allergies  Medication list has been reviewed and updated.  Current Outpatient Prescriptions on File Prior to Visit  Medication Sig Dispense Refill  . amLODipine (NORVASC) 5 MG tablet Take 1 tablet (5 mg  total) by mouth daily. 90 tablet 3  . Cholecalciferol 2000 units TABS Take 1 tablet (2,000 Units total) by mouth daily. 30 each 6  . lisinopril (PRINIVIL,ZESTRIL) 10 MG tablet Take 1 tablet (10 mg total) by mouth daily. 90 tablet 3  . meloxicam (MOBIC) 15 MG tablet Take 1 tablet (15 mg total) by mouth daily. 30 tablet 0  . methocarbamol (ROBAXIN) 500 MG tablet Take 1 tablet (500 mg total) by mouth 2 (two) times daily. 20 tablet 0  . omeprazole (PRILOSEC) 20 MG capsule Take 1 capsule (20 mg total) by mouth daily. (Patient not taking: Reported on 01/05/2017) 30 capsule 11  . traMADol (ULTRAM) 50 MG tablet Take 1 tablet (50 mg total) by mouth every 8 (eight) hours as needed. 30 tablet 2  . Vitamin D, Ergocalciferol, (DRISDOL) 50000 units CAPS capsule Take 1 capsule (50,000 Units total) by mouth every 7 (seven) days. (Patient not taking: Reported on 01/05/2017) 30 capsule 0   No current facility-administered medications on file prior to visit.     ROS ROS otherwise unremarkable unless listed above.  Physical Examination: BP (!) 142/88   Pulse 74   Temp 99.1 F (37.3 C)   Resp 16   Ht  5' (1.524 m)   Wt 139 lb 6.4 oz (63.2 kg)   SpO2 98%   BMI 27.22 kg/m  Ideal Body Weight: Weight in (lb) to have BMI = 25: 127.7  Physical Exam  Constitutional: She is oriented to person, place, and time. She appears well-developed and well-nourished. No distress.  HENT:  Head: Normocephalic and atraumatic.  Right Ear: External ear normal.  Left Ear: External ear normal.  Mouth/Throat: Posterior oropharyngeal edema and posterior oropharyngeal erythema present.  Eyes: Pupils are equal, round, and reactive to light. Conjunctivae and EOM are normal.  Cardiovascular: Normal rate.   Pulmonary/Chest: Effort normal. No respiratory distress.  Lymphadenopathy:    She has no cervical adenopathy.  Neurological: She is alert and oriented to person, place, and time.  Skin: She is not diaphoretic.  Psychiatric: She  has a normal mood and affect. Her behavior is normal.    Results for orders placed or performed in visit on 08/19/17  POCT rapid strep A  Result Value Ref Range   Rapid Strep A Screen Positive (A) Negative     Assessment and Plan: Emalyn LURAE HORNBROOK is a 56 y.o. female who is here today for cc of  Chief Complaint  Patient presents with  . Sore Throat  -nsaid use and supportive treatment advised  Streptococcal sore throat - Plan: amoxicillin (AMOXIL) 500 MG capsule  Throat pain - Plan: POCT rapid strep A, amoxicillin (AMOXIL) 500 MG capsule  Trena Platt, PA-C [facility] 10/7/201810:05 AM

## 2017-09-30 ENCOUNTER — Other Ambulatory Visit: Payer: Self-pay

## 2017-09-30 ENCOUNTER — Ambulatory Visit (INDEPENDENT_AMBULATORY_CARE_PROVIDER_SITE_OTHER): Payer: BLUE CROSS/BLUE SHIELD | Admitting: Family Medicine

## 2017-09-30 ENCOUNTER — Encounter: Payer: Self-pay | Admitting: Family Medicine

## 2017-09-30 VITALS — BP 130/70 | HR 73 | Temp 97.9°F | Resp 17 | Ht 60.0 in | Wt 139.0 lb

## 2017-09-30 DIAGNOSIS — Z131 Encounter for screening for diabetes mellitus: Secondary | ICD-10-CM | POA: Diagnosis not present

## 2017-09-30 DIAGNOSIS — Z1211 Encounter for screening for malignant neoplasm of colon: Secondary | ICD-10-CM

## 2017-09-30 DIAGNOSIS — I10 Essential (primary) hypertension: Secondary | ICD-10-CM | POA: Diagnosis not present

## 2017-09-30 DIAGNOSIS — M545 Low back pain: Secondary | ICD-10-CM | POA: Diagnosis not present

## 2017-09-30 DIAGNOSIS — E559 Vitamin D deficiency, unspecified: Secondary | ICD-10-CM

## 2017-09-30 DIAGNOSIS — K219 Gastro-esophageal reflux disease without esophagitis: Secondary | ICD-10-CM

## 2017-09-30 DIAGNOSIS — Z124 Encounter for screening for malignant neoplasm of cervix: Secondary | ICD-10-CM | POA: Diagnosis not present

## 2017-09-30 DIAGNOSIS — Z0000 Encounter for general adult medical examination without abnormal findings: Secondary | ICD-10-CM

## 2017-09-30 LAB — POCT URINALYSIS DIP (MANUAL ENTRY)
BILIRUBIN UA: NEGATIVE mg/dL
Bilirubin, UA: NEGATIVE
Blood, UA: NEGATIVE
Glucose, UA: NEGATIVE mg/dL
NITRITE UA: NEGATIVE
PH UA: 7 (ref 5.0–8.0)
PROTEIN UA: NEGATIVE mg/dL
Spec Grav, UA: 1.015 (ref 1.010–1.025)
UROBILINOGEN UA: 0.2 U/dL

## 2017-09-30 MED ORDER — MELOXICAM 15 MG PO TABS: 15.0000 mg | ORAL_TABLET | Freq: Every day | ORAL | 3 refills | Status: DC

## 2017-09-30 MED ORDER — LISINOPRIL 10 MG PO TABS: 10.0000 mg | ORAL_TABLET | Freq: Every day | ORAL | 1 refills | Status: DC

## 2017-09-30 MED ORDER — OMEPRAZOLE 20 MG PO CPDR: 20.0000 mg | DELAYED_RELEASE_CAPSULE | Freq: Every day | ORAL | 3 refills | Status: DC

## 2017-09-30 MED ORDER — AMLODIPINE BESYLATE 5 MG PO TABS: 5.0000 mg | ORAL_TABLET | Freq: Every day | ORAL | 3 refills | Status: DC

## 2017-09-30 NOTE — Progress Notes (Signed)
Chief Complaint  Patient presents with  . Annual Exam    no pap per pt  . Back Pain    Subjective:  Pam Hart is a 56 y.o. female here for a health maintenance visit.  Patient is established pt  In addition to annual wellness patient also has acute concern to address today: Back Pain  Patient reports that she has been having bilateral low back pain and bladder pressure She denies fevers or chills She states that the pain radiates from the back to the front She reports that when she empties her bladder she feels relief She denies vaginal discharge Onset for one week    Patient Active Problem List   Diagnosis Date Noted  . Right elbow pain 01/05/2017  . Right hand pain 01/05/2017  . Thyroid nodule 07/29/2016  . Neck pain 10/29/2015  . Hypertension, uncontrolled 08/22/2015  . Chest pain with moderate risk of acute coronary syndrome 08/22/2015  . GERD (gastroesophageal reflux disease) 08/22/2015  . Hyperlipidemia 03/17/2013  . FATIGUE 08/05/2007    Past Medical History:  Diagnosis Date  . Anemia   . Arthritis   . Hypertension   . Thyroid disease     Past Surgical History:  Procedure Laterality Date  . APPENDECTOMY    . EYE SURGERY Right 2011   lasix      Outpatient Medications Prior to Visit  Medication Sig Dispense Refill  . Cholecalciferol 2000 units TABS Take 1 tablet (2,000 Units total) by mouth daily. 30 each 6  . methocarbamol (ROBAXIN) 500 MG tablet Take 1 tablet (500 mg total) by mouth 2 (two) times daily. 20 tablet 0  . traMADol (ULTRAM) 50 MG tablet Take 1 tablet (50 mg total) by mouth every 8 (eight) hours as needed. 30 tablet 2  . amLODipine (NORVASC) 5 MG tablet Take 1 tablet (5 mg total) by mouth daily. 90 tablet 3  . lisinopril (PRINIVIL,ZESTRIL) 10 MG tablet Take 1 tablet (10 mg total) by mouth daily. 90 tablet 3  . meloxicam (MOBIC) 15 MG tablet Take 1 tablet (15 mg total) by mouth daily. 30 tablet 0  . omeprazole (PRILOSEC) 20 MG capsule  Take 1 capsule (20 mg total) by mouth daily. 30 capsule 11  . Vitamin D, Ergocalciferol, (DRISDOL) 50000 units CAPS capsule Take 1 capsule (50,000 Units total) by mouth every 7 (seven) days. 30 capsule 0   No facility-administered medications prior to visit.     No Known Allergies   Family History  Problem Relation Age of Onset  . Arthritis Maternal Aunt   . Hypertension Mother   . Heart disease Mother   . Alzheimer's disease Father   . Glaucoma Father   . Diabetes Sister   . Hypertension Sister   . Colon cancer Neg Hx   . Colon polyps Neg Hx   . Esophageal cancer Neg Hx   . Kidney disease Neg Hx   . Gallbladder disease Neg Hx      Social History   Socioeconomic History  . Marital status: Married    Spouse name: Not on file  . Number of children: 4  . Years of education: Not on file  . Highest education level: Not on file  Social Needs  . Financial resource strain: Not on file  . Food insecurity - worry: Not on file  . Food insecurity - inability: Not on file  . Transportation needs - medical: Not on file  . Transportation needs - non-medical: Not on file  Occupational History  . Occupation: Interpreter  Tobacco Use  . Smoking status: Never Smoker  . Smokeless tobacco: Never Used  Substance and Sexual Activity  . Alcohol use: No  . Drug use: No  . Sexual activity: Not on file  Other Topics Concern  . Not on file  Social History Narrative   Married   Education: Automotive engineer   Exercise: No   Social History   Substance and Sexual Activity  Alcohol Use No   Social History   Tobacco Use  Smoking Status Never Smoker  Smokeless Tobacco Never Used   Social History   Substance and Sexual Activity  Drug Use No    GYN: Sexual Health Menstrual status: regular menses LMP: No LMP recorded. Patient is postmenopausal. Last pap smear: see HM section History of abnormal pap smears:    Health Maintenance: See under health Maintenance activity for review of  completion dates as well. Immunization History  Administered Date(s) Administered  . Influenza Whole 08/30/2010  . Influenza,inj,Quad PF,6+ Mos 10/24/2015, 11/03/2016  . Td 11/17/2005  . Tdap 01/24/2015      Depression Screen-PHQ2/9 Depression screen [facility] 2/9 09/30/2017 09/30/2017 01/05/2017 11/18/2016 11/03/2016  Decreased Interest 0 0 0 0 0  Down, Depressed, Hopeless 0 0 0 0 0  PHQ - 2 Score 0 0 0 0 0       Depression Severity and Treatment Recommendations:  0-4= None  5-9= Mild / Treatment: Support, educate to call if worse; return in one month  10-14= Moderate / Treatment: Support, watchful waiting; Antidepressant or Psycotherapy  15-19= Moderately severe / Treatment: Antidepressant OR Psychotherapy  >= 20 = Major depression, severe / Antidepressant AND Psychotherapy    Review of Systems   Review of Systems  Constitutional: Negative for chills, fever and weight loss.  Respiratory: Negative for cough, shortness of breath and wheezing.   Cardiovascular: Negative for chest pain, palpitations and orthopnea.  Gastrointestinal: Negative for abdominal pain, nausea and vomiting.  Genitourinary: Negative for dysuria, frequency and urgency.  Musculoskeletal: Positive for back pain and joint pain. Negative for falls.  Skin: Negative for itching and rash.  Neurological: Negative for dizziness, tingling and headaches.  Psychiatric/Behavioral: Negative for depression. The patient is not nervous/anxious and does not have insomnia.     See HPI for ROS as well.    Objective:   Vitals:   09/30/17 1504  BP: 130/70  Pulse: 73  Resp: 17  Temp: 97.9 F (36.6 C)  Weight: 139 lb (63 kg)  Height: 5' (1.524 m)    Body mass index is 27.15 kg/m.  Physical Exam  Constitutional: She is oriented to person, place, and time. She appears well-developed and well-nourished.  HENT:  Head: Normocephalic and atraumatic.  Right Ear: External ear normal.  Left Ear: External ear normal.    Nose: Nose normal.  Mouth/Throat: Oropharynx is clear and moist. No oropharyngeal exudate.  Eyes: Conjunctivae and EOM are normal. Right eye exhibits no discharge. Left eye exhibits no discharge.  Cardiovascular: Normal rate, regular rhythm, normal heart sounds and intact distal pulses.  Pulmonary/Chest: Effort normal and breath sounds normal. No respiratory distress. She has no wheezes. She has no rales.  Abdominal: Soft. Bowel sounds are normal. She exhibits no distension and no mass. There is no tenderness. There is no rebound and no guarding.  Musculoskeletal:       Lumbar back: She exhibits tenderness and spasm. She exhibits normal range of motion, no bony tenderness, no swelling, no edema, no deformity,  no laceration, no pain and normal pulse.       Back:  Neurological: She is alert and oriented to person, place, and time. She has normal reflexes.  Skin: Skin is warm. No erythema.  Psychiatric: She has a normal mood and affect. Her behavior is normal. Judgment and thought content normal.       Assessment/Plan:   Patient was seen for a health maintenance exam.  Counseled the patient on health maintenance issues. Reviewed her health mainteance schedule and ordered appropriate tests (see orders.) Counseled on regular exercise and weight management. Recommend regular eye exams and dental cleaning.   The following issues were addressed today for health maintenance:   Sendy was seen today for annual exam and back pain.  Diagnoses and all orders for this visit:  Cervical cancer screening- would prefer going to Gynecology -     Ambulatory referral to Gynecology  Health maintenance examination- discussed age appropriate screenings including mammogram  Acute bilateral low back pain, with sciatica presence unspecified- likely musculoskeletal -     POCT urinalysis dipstick  Screening for malignant neoplasm of colon Comments: discussed screening options including cologuard and  patient declined   Essential hypertension- bp at goal, will check renal function -     Lipid panel -     Comprehensive metabolic panel  Screening for diabetes mellitus -     Comprehensive metabolic panel -     Hemoglobin A1c     Return in about 6 months (around 03/30/2018) for blood pressure follow up.    Body mass index is 27.15 kg/m.:  Discussed the patient's BMI with patient. The BMI body mass index is 27.15 kg/m.     No future appointments.  Patient Instructions       IF you received an x-ray today, you will receive an invoice from [facility]. Please contact [facility] at [phone] with questions or concerns regarding your invoice.   IF you received labwork today, you will receive an invoice from [facility]. Please contact [facility] at [phone] with questions or concerns regarding your invoice.   Our billing staff will not be able to assist you with questions regarding bills from these companies.  You will be contacted with the lab results as soon as they are available. The fastest way to get your results is to activate your My Chart account. Instructions are located on the last page of this paperwork. If you have not heard from us regarding the results in 2 weeks, please contact this office.    We recommend that you schedule a mammogram for breast cancer screening. Typically, you do not need a referral to do this. Please contact a local imaging center to schedule your mammogram.  [facility] - [phone]) [phone]  *ask for the Radiology Department [facility]) - [phone] or [phone]  [facility] - [phone]) [phone] [facility] - [phone]) [phone] [facility] - [phone](336) [phone]  *ask for the Radiology Department [facility] - [phone]  *ask for the Radiology Department [facility] - [phone]  *ask for the Mammography Department [facility] - [phone]

## 2017-09-30 NOTE — Patient Instructions (Addendum)
     IF you received an x-ray today, you will receive an invoice from [facility]. Please contact [facility] at [phone] with questions or concerns regarding your invoice.   IF you received labwork today, you will receive an invoice from [facility]. Please contact [facility] at [phone] with questions or concerns regarding your invoice.   Our billing staff will not be able to assist you with questions regarding bills from these companies.  You will be contacted with the lab results as soon as they are available. The fastest way to get your results is to activate your My Chart account. Instructions are located on the last page of this paperwork. If you have not heard from us regarding the results in 2 weeks, please contact this office.    We recommend that you schedule a mammogram for breast cancer screening. Typically, you do not need a referral to do this. Please contact a local imaging center to schedule your mammogram.  [facility] - [phone]  *ask for the Radiology Department [facility] ([facility]) - [phone] or [phone]  [facility] - [phone] [facility] - [phone] [facility] - [phone]  *ask for the Radiology Department [facility] - [phone]  *ask for the Radiology Department [facility] - [phone]  *ask for the Mammography Department [facility] - [phone] 

## 2017-10-01 ENCOUNTER — Encounter: Payer: Self-pay | Admitting: Family Medicine

## 2017-10-01 ENCOUNTER — Telehealth: Payer: Self-pay

## 2017-10-01 LAB — LIPID PANEL
CHOL/HDL RATIO: 4.3 ratio (ref 0.0–4.4)
Cholesterol, Total: 212 mg/dL — ABNORMAL HIGH (ref 100–199)
HDL: 49 mg/dL (ref >39)
LDL Calculated: 95 mg/dL (ref 0–99)
Triglycerides: 341 mg/dL — ABNORMAL HIGH (ref 0–149)
VLDL Cholesterol Cal: 68 mg/dL — ABNORMAL HIGH (ref 5–40)

## 2017-10-01 LAB — COMPREHENSIVE METABOLIC PANEL
ALK PHOS: 79 IU/L (ref 39–117)
ALT: 18 IU/L (ref 0–32)
AST: 15 IU/L (ref 0–40)
Albumin/Globulin Ratio: 1.8 (ref 1.2–2.2)
Albumin: 4.4 g/dL (ref 3.5–5.5)
BILIRUBIN TOTAL: 0.3 mg/dL (ref 0.0–1.2)
BUN/Creatinine Ratio: 11 (ref 9–23)
BUN: 9 mg/dL (ref 6–24)
CHLORIDE: 103 mmol/L (ref 96–106)
CO2: 28 mmol/L (ref 20–29)
Calcium: 9.4 mg/dL (ref 8.7–10.2)
Creatinine, Ser: 0.84 mg/dL (ref 0.57–1.00)
GFR calc non Af Amer: 77 mL/min/{1.73_m2} (ref >59)
GFR, EST AFRICAN AMERICAN: 89 mL/min/{1.73_m2} (ref >59)
GLUCOSE: 79 mg/dL (ref 65–99)
Globulin, Total: 2.5 g/dL (ref 1.5–4.5)
Potassium: 4.1 mmol/L (ref 3.5–5.2)
Sodium: 145 mmol/L — ABNORMAL HIGH (ref 134–144)
TOTAL PROTEIN: 6.9 g/dL (ref 6.0–8.5)

## 2017-10-01 LAB — VITAMIN D 25 HYDROXY (VIT D DEFICIENCY, FRACTURES): Vit D, 25-Hydroxy: 12.5 ng/mL — ABNORMAL LOW (ref 30.0–100.0)

## 2017-10-01 LAB — HEMOGLOBIN A1C
Est. average glucose Bld gHb Est-mCnc: 114 mg/dL
HEMOGLOBIN A1C: 5.6 % (ref 4.8–5.6)

## 2017-10-01 MED ORDER — PRAVASTATIN SODIUM 20 MG PO TABS: 20.0000 mg | ORAL_TABLET | Freq: Every day | ORAL | 3 refills | Status: DC

## 2017-10-01 NOTE — Telephone Encounter (Signed)
Sent lab results via mail to home address. Dgaddy, CMA

## 2017-10-02 ENCOUNTER — Telehealth: Payer: Self-pay | Admitting: Family Medicine

## 2017-10-02 NOTE — Telephone Encounter (Signed)
Copied from CRM [phone]. Topic: Quick Communication - Lab Results >> Oct 02, 2017  9:38 AM Guinevere FerrariMorris, Deantre Bourdon E, NT wrote: Pt. Is calling about for results for her lab results from her physical

## 2017-10-05 NOTE — Telephone Encounter (Signed)
Informed patient.  Voiced understanding 

## 2017-10-20 ENCOUNTER — Ambulatory Visit: Payer: BLUE CROSS/BLUE SHIELD | Admitting: Obstetrics and Gynecology

## 2017-10-20 ENCOUNTER — Other Ambulatory Visit: Payer: Self-pay

## 2017-10-20 ENCOUNTER — Encounter: Payer: Self-pay | Admitting: Certified Nurse Midwife

## 2017-10-20 ENCOUNTER — Other Ambulatory Visit (HOSPITAL_COMMUNITY)
Admission: RE | Admit: 2017-10-20 | Discharge: 2017-10-20 | Disposition: A | Discharge status: Home / Self Care | Payer: BLUE CROSS/BLUE SHIELD | Source: Ambulatory Visit | Attending: Certified Nurse Midwife | Admitting: Certified Nurse Midwife

## 2017-10-20 ENCOUNTER — Other Ambulatory Visit: Payer: Self-pay | Admitting: Certified Nurse Midwife

## 2017-10-20 ENCOUNTER — Ambulatory Visit: Payer: BLUE CROSS/BLUE SHIELD | Admitting: Certified Nurse Midwife

## 2017-10-20 VITALS — BP 130/72 | HR 76 | Resp 18 | Ht 60.75 in | Wt 141.0 lb

## 2017-10-20 DIAGNOSIS — R8290 Unspecified abnormal findings in urine: Secondary | ICD-10-CM

## 2017-10-20 DIAGNOSIS — E079 Disorder of thyroid, unspecified: Secondary | ICD-10-CM | POA: Diagnosis not present

## 2017-10-20 DIAGNOSIS — I10 Essential (primary) hypertension: Secondary | ICD-10-CM | POA: Insufficient documentation

## 2017-10-20 DIAGNOSIS — Z139 Encounter for screening, unspecified: Secondary | ICD-10-CM

## 2017-10-20 DIAGNOSIS — Z8639 Personal history of other endocrine, nutritional and metabolic disease: Secondary | ICD-10-CM

## 2017-10-20 DIAGNOSIS — N898 Other specified noninflammatory disorders of vagina: Secondary | ICD-10-CM | POA: Insufficient documentation

## 2017-10-20 DIAGNOSIS — M1990 Unspecified osteoarthritis, unspecified site: Secondary | ICD-10-CM | POA: Diagnosis not present

## 2017-10-20 DIAGNOSIS — Z8679 Personal history of other diseases of the circulatory system: Secondary | ICD-10-CM

## 2017-10-20 DIAGNOSIS — Z1151 Encounter for screening for human papillomavirus (HPV): Secondary | ICD-10-CM | POA: Diagnosis not present

## 2017-10-20 DIAGNOSIS — Z124 Encounter for screening for malignant neoplasm of cervix: Secondary | ICD-10-CM

## 2017-10-20 DIAGNOSIS — Z8249 Family history of ischemic heart disease and other diseases of the circulatory system: Secondary | ICD-10-CM | POA: Insufficient documentation

## 2017-10-20 DIAGNOSIS — Z79899 Other long term (current) drug therapy: Secondary | ICD-10-CM | POA: Diagnosis not present

## 2017-10-20 DIAGNOSIS — Z01419 Encounter for gynecological examination (general) (routine) without abnormal findings: Secondary | ICD-10-CM | POA: Diagnosis not present

## 2017-10-20 DIAGNOSIS — Z820 Family history of epilepsy and other diseases of the nervous system: Secondary | ICD-10-CM | POA: Diagnosis not present

## 2017-10-20 DIAGNOSIS — Z659 Problem related to unspecified psychosocial circumstances: Secondary | ICD-10-CM | POA: Diagnosis not present

## 2017-10-20 DIAGNOSIS — Z833 Family history of diabetes mellitus: Secondary | ICD-10-CM | POA: Diagnosis not present

## 2017-10-20 DIAGNOSIS — E785 Hyperlipidemia, unspecified: Secondary | ICD-10-CM | POA: Insufficient documentation

## 2017-10-20 DIAGNOSIS — N951 Menopausal and female climacteric states: Secondary | ICD-10-CM

## 2017-10-20 LAB — POCT URINALYSIS DIPSTICK
BILIRUBIN UA: NEGATIVE
GLUCOSE UA: NEGATIVE
KETONES UA: NEGATIVE
Nitrite, UA: NEGATIVE
Protein, UA: NEGATIVE
RBC UA: NEGATIVE
Urobilinogen, UA: 0.2 E.U./dL
pH, UA: 6 (ref 5.0–8.0)

## 2017-10-20 NOTE — Patient Instructions (Signed)

## 2017-10-20 NOTE — Progress Notes (Signed)
56 y.o. [id number] Married  From [place](speaks and understands English well) here to establish gyn care and for annual exam. Last period 5 years ago? Denies vaginal bleeding or vaginal dryness. Seeing [facility] for PCP for management of hypertension, cholesterol and pain with arthritis. Has labs and aex there and has next appointment in 3 months. Has noted vaginal odor off and on for the past few days, not sure if urine odor too. Denies vaginal itching, discharge or new personal products. Denies urinary frequency,urgency or pain with urination. Drinking small amount of fluids. Eats small amounts. Has no had mammogram in 4 years and would like to be scheduled while she is here. No other health issues today. Patient very sad today due to death of son 4120 with he was robbed and subsequently shot and killed. She went home with body to [place] and spent some time with mother and sisters, which has helped. No other health issues today.  . No LMP recorded. Patient is postmenopausal.          Sexually active: Yes.    The current method of family planning is post menopausal status.    Exercising: No.  exercise Smoker:  no  Health Maintenance: Pap:  2-5157yrs ago History of Abnormal Pap: no MMG:  07-14-13 category b density birads 1:neg Self Breast exams: yes Colonoscopy:  none BMD:   none TDaP:  2016 Shingles: no Pneumonia: no Hep C and HIV: Hep c neg 2015, HIV ? With pregnancy Labs: no   reports that  has never smoked. she has never used smokeless tobacco. She reports that she does not drink alcohol or use drugs.  Past Medical History:  Diagnosis Date  . Anemia   . Arthritis   . Hypertension   . Thyroid disease     Past Surgical History:  Procedure Laterality Date  . APPENDECTOMY    . EYE SURGERY Right 2011   lasix     Current Outpatient Medications  Medication Sig Dispense Refill  . amLODipine (NORVASC) 5 MG tablet Take 1 tablet (5 mg total) daily by mouth. 90 tablet 3  .  Cholecalciferol 2000 units TABS Take 1 tablet (2,000 Units total) by mouth daily. 30 each 6  . lisinopril (PRINIVIL,ZESTRIL) 10 MG tablet Take 1 tablet (10 mg total) daily by mouth. 90 tablet 1  . meloxicam (MOBIC) 15 MG tablet Take 1 tablet (15 mg total) daily by mouth. (Patient taking differently: Take 15 mg by mouth as needed. ) 30 tablet 3  . methocarbamol (ROBAXIN) 500 MG tablet Take 1 tablet (500 mg total) by mouth 2 (two) times daily. 20 tablet 0  . omeprazole (PRILOSEC) 20 MG capsule Take 1 capsule (20 mg total) daily by mouth. 90 capsule 3  . pravastatin (PRAVACHOL) 20 MG tablet Take 1 tablet (20 mg total) daily by mouth. 90 tablet 3  . traMADol (ULTRAM) 50 MG tablet Take 1 tablet (50 mg total) by mouth every 8 (eight) hours as needed. 30 tablet 2   No current facility-administered medications for this visit.     Family History  Problem Relation Age of Onset  . Hypertension Mother   . Heart disease Mother   . Alzheimer's disease Father   . Glaucoma Father   . Diabetes Sister   . Hypertension Sister   . Colon cancer Neg Hx   . Colon polyps Neg Hx   . Esophageal cancer Neg Hx   . Kidney disease Neg Hx   . Gallbladder disease  Neg Hx     ROS:  Pertinent items are noted in HPI.  Otherwise, a comprehensive ROS was negative.  Exam:   BP (!) 160/80   Pulse 70   Resp 16   Ht 5' 0.75" (1.543 m)   Wt 141 lb (64 kg)   BMI 26.86 kg/m  Height: 5' 0.75" (154.3 cm) Ht Readings from Last 3 Encounters:  10/20/17 5' 0.75" (1.543 m)  09/30/17 5' (1.524 m)  08/19/17 5' (1.524 m)    General appearance: alert, cooperative and appears stated age Head: Normocephalic, without obvious abnormality, atraumatic Neck: no adenopathy, supple, symmetrical, trachea midline and thyroid normal to inspection and palpation Lungs: clear to auscultation bilaterally CVAT: bilateral negative Breasts: normal appearance, no masses or tenderness, No nipple retraction or dimpling, No nipple discharge or  bleeding, No axillary or supraclavicular adenopathy Heart: regular rate and rhythm Abdomen: soft, non-tender; no masses,  no organomegaly,very slight suprapubic tenderness Extremities: extremities normal, atraumatic, no cyanosis or edema Skin: Skin color, texture, turgor normal. No rashes or lesions Lymph nodes: Cervical, supraclavicular, and axillary nodes normal. No abnormal inguinal nodes palpated Neurologic: Grossly normal   Pelvic: External genitalia:  no lesions, normal female              Urethra:  normal appearing urethra with no masses, tenderness or lesions  Bladder very slight tenderness, Urethral meatus non tender              Bartholin's and Skene's: normal                 Vagina: normal appearing vagina with normal color and scant discharge, no lesions, slight dryness noted              Cervix: multiparous appearance, no cervical motion tenderness and no lesions              Pap taken: Yes.   Bimanual Exam:  Uterus:  normal size, contour, position, consistency, mobility, non-tender              Adnexa: normal adnexa and no mass, fullness, tenderness               Rectovaginal: Confirms               Anus:  normal sphincter tone, no lesions  Chaperone present: yes  A:  Well Woman with normal exam  Menopausal no HRT  Vaginal dryness  R/O UTI  Social stress with death of son  Mammogram due  Hypertension/Hyperlipidemia/arthritis management with PCP  P:   Reviewed health and wellness pertinent to exam  Discussed importance of notifying if vaginal bleeding.  Discussed vaginal dryness and etiology and coconut or Olive oil use with instructions given to try.  Warning signs of UTI given and if occurs, needs to advise.   Lab: Urine culture will treat if indicated  Discussed seeking family and friends for support.  Will schedule mammogram prior to leaving today.  Continue follow up with PCP as indicated  Pap smear: yes   counseled on breast self exam, mammography  screening, menopause, adequate intake of calcium and vitamin D, diet and exercise  return annually or prn  An After Visit Summary was printed and given to the patient.

## 2017-10-21 LAB — URINE CULTURE

## 2017-10-22 LAB — CYTOLOGY - PAP
Diagnosis: NEGATIVE
HPV: NOT DETECTED

## 2017-11-03 ENCOUNTER — Encounter: Payer: Self-pay | Admitting: Physician Assistant

## 2017-11-03 ENCOUNTER — Ambulatory Visit: Payer: BLUE CROSS/BLUE SHIELD | Admitting: Urgent Care

## 2017-11-03 ENCOUNTER — Ambulatory Visit (INDEPENDENT_AMBULATORY_CARE_PROVIDER_SITE_OTHER): Payer: BLUE CROSS/BLUE SHIELD | Admitting: Physician Assistant

## 2017-11-03 ENCOUNTER — Other Ambulatory Visit: Payer: Self-pay

## 2017-11-03 VITALS — BP 170/88 | HR 61 | Temp 97.7°F | Resp 16 | Ht 61.0 in | Wt 142.4 lb

## 2017-11-03 DIAGNOSIS — R69 Illness, unspecified: Secondary | ICD-10-CM | POA: Diagnosis not present

## 2017-11-03 DIAGNOSIS — R319 Hematuria, unspecified: Secondary | ICD-10-CM | POA: Diagnosis not present

## 2017-11-03 DIAGNOSIS — J029 Acute pharyngitis, unspecified: Secondary | ICD-10-CM

## 2017-11-03 DIAGNOSIS — J111 Influenza due to unidentified influenza virus with other respiratory manifestations: Secondary | ICD-10-CM

## 2017-11-03 LAB — POCT URINALYSIS DIP (MANUAL ENTRY)
Bilirubin, UA: NEGATIVE
Blood, UA: NEGATIVE
Glucose, UA: NEGATIVE mg/dL
Ketones, POC UA: NEGATIVE mg/dL
Nitrite, UA: NEGATIVE
Protein Ur, POC: NEGATIVE mg/dL
Spec Grav, UA: 1.01 (ref 1.010–1.025)
Urobilinogen, UA: 0.2 U/dL
pH, UA: 7 (ref 5.0–8.0)

## 2017-11-03 LAB — POC MICROSCOPIC URINALYSIS (UMFC): Mucus: ABSENT

## 2017-11-03 LAB — POC INFLUENZA A&B (BINAX/QUICKVUE)
Influenza A, POC: NEGATIVE
Influenza B, POC: NEGATIVE

## 2017-11-03 MED ORDER — OSELTAMIVIR PHOSPHATE 75 MG PO CAPS: 75.0000 mg | ORAL_CAPSULE | Freq: Two times a day (BID) | ORAL | 0 refills | Status: DC

## 2017-11-03 MED ORDER — FLUTICASONE PROPIONATE 50 MCG/ACT NA SUSP: 2.0000 | Freq: Every day | NASAL | 12 refills | Status: DC

## 2017-11-03 MED ORDER — BENZOCAINE-MENTHOL 15-2.6 MG MT LOZG: 1.0000 | LOZENGE | Freq: Four times a day (QID) | OROMUCOSAL | 0 refills | Status: DC

## 2017-11-03 NOTE — Progress Notes (Signed)
Pam Hart  MRN: [id number] DOB: [birth year]  PCP: Patient, No Pcp Per  Subjective:  Pt is a 56 year old female who presents to clinic for sore throat x 1 day. She endorses sudden onset of sneezing, nasal congestion, fatigue and cough. No known sick contacts. She has taken a flu medication and mucinex - not helping.    Blood in urine - [facility] and was told she had blood in her urine. She would like this rechecked.  Review of Systems  Constitutional: Positive for fatigue. Negative for chills, diaphoresis and fever.  HENT: Positive for congestion, postnasal drip, rhinorrhea and sore throat. Negative for sinus pressure and sinus pain.   Respiratory: Negative for cough, shortness of breath and wheezing.   Psychiatric/Behavioral: Negative for sleep disturbance.    Patient Active Problem List   Diagnosis Date Noted  . Right elbow pain 01/05/2017  . Right hand pain 01/05/2017  . Thyroid nodule 07/29/2016  . Neck pain 10/29/2015  . Hypertension, uncontrolled 08/22/2015  . Chest pain with moderate risk of acute coronary syndrome 08/22/2015  . GERD (gastroesophageal reflux disease) 08/22/2015  . Hyperlipidemia 03/17/2013  . FATIGUE 08/05/2007    Current Outpatient Medications on File Prior to Visit  Medication Sig Dispense Refill  . amLODipine (NORVASC) 5 MG tablet Take 1 tablet (5 mg total) daily by mouth. 90 tablet 3  . Cholecalciferol 2000 units TABS Take 1 tablet (2,000 Units total) by mouth daily. 30 each 6  . lisinopril (PRINIVIL,ZESTRIL) 10 MG tablet Take 1 tablet (10 mg total) daily by mouth. 90 tablet 1  . meloxicam (MOBIC) 15 MG tablet Take 1 tablet (15 mg total) daily by mouth. (Patient taking differently: Take 15 mg by mouth as needed. ) 30 tablet 3  . methocarbamol (ROBAXIN) 500 MG tablet Take 1 tablet (500 mg total) by mouth 2 (two) times daily. 20 tablet 0  . omeprazole (PRILOSEC) 20 MG capsule Take 1 capsule (20 mg total) daily by  mouth. 90 capsule 3  . pravastatin (PRAVACHOL) 20 MG tablet Take 1 tablet (20 mg total) daily by mouth. 90 tablet 3  . traMADol (ULTRAM) 50 MG tablet Take 1 tablet (50 mg total) by mouth every 8 (eight) hours as needed. 30 tablet 2   No current facility-administered medications on file prior to visit.     No Known Allergies   Objective:  BP (!) 170/88   Pulse 61   Temp 97.7 F (36.5 C) (Oral)   Resp 16   Ht 5\' 1"  (1.549 m)   Wt 142 lb 6.4 oz (64.6 kg)   SpO2 100%   BMI 26.91 kg/m   Physical Exam  Constitutional: She is oriented to person, place, and time and well-developed, well-nourished, and in no distress. No distress.  HENT:  Right Ear: Tympanic membrane normal.  Left Ear: Tympanic membrane normal.  Nose: Mucosal edema present. No rhinorrhea. Right sinus exhibits no maxillary sinus tenderness and no frontal sinus tenderness. Left sinus exhibits no maxillary sinus tenderness and no frontal sinus tenderness.  Mouth/Throat: Oropharynx is clear and moist and mucous membranes are normal.  Cardiovascular: Normal rate, regular rhythm and normal heart sounds.  Pulmonary/Chest: Effort normal and breath sounds normal. No respiratory distress. She has no wheezes. She has no rales.  Abdominal: Soft. Normal appearance. There is no tenderness. There is no CVA tenderness.  Neurological: She is alert and oriented to person, place, and time. GCS score is 15.  Skin: Skin is warm and dry.  Psychiatric: Mood, memory, affect and judgment normal.  Vitals reviewed.  Results for orders placed or performed in visit on 11/03/17  POC Influenza A&B(BINAX/QUICKVUE)  Result Value Ref Range   Influenza A, POC Negative Negative   Influenza B, POC Negative Negative  POCT Microscopic Urinalysis ([facility])  Result Value Ref Range   WBC,UR,HPF,POC Moderate (A) None WBC/hpf   RBC,UR,HPF,POC None None RBC/hpf   Bacteria Few (A) None, Too numerous to count   Mucus Absent Absent   Epithelial Cells, UR Per  Microscopy Few (A) None, Too numerous to count cells/hpf  POCT urinalysis dipstick  Result Value Ref Range   Color, UA yellow yellow   Clarity, UA clear clear   Glucose, UA negative negative mg/dL   Bilirubin, UA negative negative   Ketones, POC UA negative negative mg/dL   Spec Grav, UA [id number] [id number] - 1.025   Blood, UA negative negative   pH, UA 7.0 5.0 - 8.0   Protein Ur, POC negative negative mg/dL   Urobilinogen, UA 0.2 0.2 or 1.0 E.U./dL   Nitrite, UA Negative Negative   Leukocytes, UA Small (1+) (A) Negative    Assessment and Plan :  1. Influenza-like illness 2. Sore throat - oseltamivir (TAMIFLU) 75 MG capsule; Take 1 capsule (75 mg total) by mouth 2 (two) times daily.  Dispense: 10 capsule; Refill: 0 - fluticasone (FLONASE) 50 MCG/ACT nasal spray; Place 2 sprays into both nostrils daily.  Dispense: 16 g; Refill: 12 - POC Influenza A&B(BINAX/QUICKVUE) - Benzocaine-Menthol (CEPACOL EXTRA STRENGTH) 15-2.6 MG LOZG; Use as directed 1 lozenge in the mouth or throat every 6 (six) hours.  Dispense: 16 lozenge; Refill: 0 - Rapid flu is negative. Suspect false negative. She is within 48 hours of symptom onset. Plan to treat with tamiflu. RTC in 5-7 days if no improvement.  3. Hematuria, unspecified type - POCT Microscopic Urinalysis ([facility]) - POCT urinalysis dipstick - Urine Culture - Negative for blood. Positive leukocytes - this is not new for this pt. Culture is pending. Will await results to decide on treatment.   Marco CollieWhitney Leeza Heiner, PA-C  Primary Care at [facility] 11/03/2017 12:17 PM

## 2017-11-03 NOTE — Patient Instructions (Addendum)
There is no blood in your urine today. Follow up with [facility] if you are still having symptoms.   Tamiflu - for your sickness.  take this as directed.  Flonase - 2 sprays each nostril every 6 hours.  Cepacol is for sore throat.   Stay well hydrated. Get lost of rest.   Advil or ibuprofen for pain. Do not take Aspirin.  Throat lozenges (if you are not at risk for choking) or sprays may be used to soothe your throat. Drink enough water and fluids to keep your urine clear or pale yellow. For sore throat: ? Gargle with 8 oz of salt water ( tsp of salt per 1 qt of water) as often as every 1-2 hours to soothe your throat.  Gargle liquid benadryl.  Use Elderberry syrup.   For sore throat try using a honey-based tea. Use 3 teaspoons of honey with juice squeezed from half lemon. Place shaved pieces of ginger into 1/2-1 cup of water and warm over stove top. Then mix the ingredients and repeat every 4 hours as needed.  Cough Syrup Recipe: Sweet Lemon & Honey Thyme  Ingredients a handful of fresh thyme sprigs   1 pint of water (2 cups)  1/2 cup honey (raw is best, but regular will do)  1/2 lemon chopped Instructions 1. Place the lemon in the pint jar and cover with the honey. The honey will macerate the lemons and draw out liquids which taste so delicious! 2. Meanwhile, toss the thyme leaves into a saucepan and cover them with the water. 3. Bring the water to a gentle simmer and reduce it to half, about a cup of tea. 4. When the tea is reduced and cooled a bit, strain the sprigs & leaves, add it into the pint jar and stir it well. 5. Give it a shake and use a spoonful as needed. 6. Store your homemade cough syrup in the refrigerator for about a month.  Thank you for coming in today. I hope you feel we met your needs.  Feel free to call PCP if you have any questions or further requests.  Please consider signing up for MyChart if you do not already have it, as this is a  great way to communicate with me.  Best,  Whitney McVey, PA-C   IF you received an x-ray today, you will receive an invoice from [facility]. Please contact [facility] at [phone] with questions or concerns regarding your invoice.   IF you received labwork today, you will receive an invoice from [facility]. Please contact [facility] at [phone] with questions or concerns regarding your invoice.   Our billing staff will not be able to assist you with questions regarding bills from these companies.  You will be contacted with the lab results as soon as they are available. The fastest way to get your results is to activate your My Chart account. Instructions are located on the last page of this paperwork. If you have not heard from [place] regarding the results in 2 weeks, please contact this office.

## 2017-11-05 LAB — URINE CULTURE

## 2017-11-20 ENCOUNTER — Ambulatory Visit: Payer: BLUE CROSS/BLUE SHIELD

## 2018-02-05 ENCOUNTER — Ambulatory Visit (INDEPENDENT_AMBULATORY_CARE_PROVIDER_SITE_OTHER): Payer: BLUE CROSS/BLUE SHIELD | Admitting: Urgent Care

## 2018-02-05 ENCOUNTER — Ambulatory Visit (INDEPENDENT_AMBULATORY_CARE_PROVIDER_SITE_OTHER): Payer: BLUE CROSS/BLUE SHIELD

## 2018-02-05 ENCOUNTER — Encounter: Payer: Self-pay | Admitting: Urgent Care

## 2018-02-05 ENCOUNTER — Other Ambulatory Visit: Payer: Self-pay

## 2018-02-05 VITALS — BP 130/68 | HR 66 | Temp 98.2°F | Ht 61.0 in | Wt 145.6 lb

## 2018-02-05 DIAGNOSIS — M79672 Pain in left foot: Secondary | ICD-10-CM

## 2018-02-05 DIAGNOSIS — M791 Myalgia, unspecified site: Secondary | ICD-10-CM

## 2018-02-05 MED ORDER — CELECOXIB 200 MG PO CAPS: 200.0000 mg | ORAL_CAPSULE | Freq: Every day | ORAL | 0 refills | Status: DC

## 2018-02-05 NOTE — Progress Notes (Signed)
    MRN: [id number] DOB: 1961-03-14  Subjective:   Pam Hart is a 57 y.o. female presenting for 2 week history of left heel pain. Pain is over plantar surface of her heel, worse in the morning. Denies trauma, falls, swelling, warmth, headaches, sore throat, cough, chest pain, n/v, abdominal pain, hematuria, dysuria, rashes. She was taken pravastatin but stopped this ~2 months ago when she left the country for religious gathering. She actually did a lot of walking, standing on her traveling. Has tried using meloxicam and Robaxin for her myalgia, heel pain with some relief.   Pam Hart has a current medication list which includes the following prescription(s): amlodipine, benzocaine-menthol, cholecalciferol, lisinopril, meloxicam, methocarbamol, omeprazole, pravastatin, and tramadol. Also has No Known Allergies.  Pam Hart  has a past medical history of Anemia, Arthritis, Hypertension, and Thyroid disease. Also  has a past surgical history that includes Appendectomy and Eye surgery (Right, 2011).  Objective:   Vitals: BP 130/68 (BP Location: Right Arm, Patient Position: Sitting, Cuff Size: Normal)   Pulse 66   Temp 98.2 F (36.8 C) (Oral)   Ht 5\' 1"  (1.549 m)   Wt 145 lb 9.6 oz (66 kg)   SpO2 98%   BMI 27.51 kg/m   Physical Exam  Constitutional: She is oriented to person, place, and time. She appears well-developed and well-nourished.  HENT:  Mouth/Throat: Oropharynx is clear and moist.  Cardiovascular: Normal rate, regular rhythm and intact distal pulses. Exam reveals no gallop and no friction rub.  No murmur heard. Pulmonary/Chest: Effort normal. No respiratory distress. She has no wheezes. She has no rales.  Musculoskeletal:       Feet:  Neurological: She is alert and oriented to person, place, and time.  Skin: Skin is warm and dry.  Psychiatric: She has a normal mood and affect.    Dg Foot 2 Views Left  Result Date: 02/05/2018 CLINICAL DATA:  Left foot heel pain EXAM: LEFT FOOT  - 2 VIEW COMPARISON:  None available FINDINGS: There is no evidence of fracture or dislocation. There is no evidence of arthropathy or other focal bone abnormality. Soft tissues are unremarkable. IMPRESSION: No acute osseous finding. Electronically Signed   By: Judie PetitM.  Shick M.D.   On: 02/05/2018 12:40    Assessment and Plan :   Pain of left heel - Plan: DG Foot 2 Views Left  Myalgia - Plan: CK, Basic metabolic panel, Sedimentation Rate  Suspect that she is recovering from her vigorous activity during her traveling. Recommended she rest, hydrate well and use NSAID, Robaxin. She would like to try Celebrex, labs pending. Return-to-clinic precautions discussed, patient verbalized understanding.   Wallis BambergMario Chloe Miyoshi, PA-C Primary Care at [facility] [phone] 02/05/2018  11:47 AM

## 2018-02-05 NOTE — Patient Instructions (Addendum)
Muscle Pain, Adult Muscle pain (myalgia) may be mild or severe. In most cases, the pain lasts only a short time and it goes away without treatment. It is normal to feel some muscle pain after starting a workout program. Muscles that have not been used often will be sore at first. Muscle pain may also be caused by many other things, including:  Overuse or muscle strain, especially if you are not in shape. This is the most common cause of muscle pain.  Injury.  Bruises.  Viruses, such as the flu.  Infectious diseases.  A chronic condition that causes muscle tenderness, fatigue, and headache (fibromyalgia).  A condition, such as lupus, in which the body's disease-fighting system attacks other organs in the body (autoimmune or rheumatologic diseases).  Certain drugs, including ACE inhibitors and statins.  To diagnose the cause of your muscle pain, your health care provider will do a physical exam and ask questions about the pain and when it began. If you have not had muscle pain for very long, your health care provider may want to wait before doing much testing. If your muscle pain has lasted a long time, your health care provider may want to run tests right away. In some cases, this may include tests to rule out certain conditions or illnesses. Treatment for muscle pain depends on the cause. Home care is often enough to relieve muscle pain. Your health care provider may also prescribe anti-inflammatory medicine. Follow these instructions at home: Activity  If overuse is causing your muscle pain: ? Slow down your activities until the pain goes away. ? Do regular, gentle exercises if you are not usually active. ? Warm up before exercising. Stretch before and after exercising. This can help lower the risk of muscle pain.  Do not continue working out if the pain is very bad. Bad pain could mean that you have injured a muscle. Managing pain and discomfort   If directed, apply ice to the  sore muscle: ? Put ice in a plastic bag. ? Place a towel between your skin and the bag. ? Leave the ice on for 20 minutes, 2-3 times a day.  You may also alternate between applying ice and applying heat as told by your health care provider. To apply heat, use the heat source that your health care provider recommends, such as a moist heat pack or a heating pad. ? Place a towel between your skin and the heat source. ? Leave the heat on for 20-30 minutes. ? Remove the heat if your skin turns bright red. This is especially important if you are unable to feel pain, heat, or cold. You may have a greater risk of getting burned. Medicines  Take over-the-counter and prescription medicines only as told by your health care provider.  Do not drive or use heavy machinery while taking prescription pain medicine. Contact a health care provider if:  Your muscle pain gets worse and medicines do not help.  You have muscle pain that lasts longer than 3 days.  You have a rash or fever along with muscle pain.  You have muscle pain after a tick bite.  You have muscle pain while working out, even though you are in good physical condition.  You have redness, soreness, or swelling along with muscle pain.  You have muscle pain after starting a new medicine or changing the dose of a medicine. Get help right away if:  You have trouble breathing.  You have trouble swallowing.  You have   muscle pain along with a stiff neck, fever, and vomiting.  You have severe muscle weakness or cannot move part of your body. This information is not intended to replace advice given to you by your health care provider. Make sure you discuss any questions you have with your health care provider. Document Released: 09/25/2006 Document Revised: 05/23/2016 Document Reviewed: 03/25/2016 Elsevier Interactive Patient Education  2018 Elsevier Inc.     Heel Spur A heel spur is a bony growth that forms on the bottom of your  heel bone (calcaneus). Heel spurs are common and do not always cause pain. However, heel spurs often cause inflammation in the strong band of tissue that runs underneath the bone of your foot (plantar fascia). When this happens, you may feel pain on the bottom of your foot, near your heel. What are the causes? The cause of heel spurs is not completely understood. They may be caused by pressure on the heel. Or, they may stem from the muscle attachments (tendons) near the spur pulling on the heel. What increases the risk? You may be at risk for a heel spur if you:  Are older than 40.  Are overweight.  Have wear and tear arthritis (osteoarthritis).  Have plantar fascia inflammation.  What are the signs or symptoms? Some people have heel spurs but no symptoms. If you do have symptoms, they may include:  Pain in the bottom of your heel.  Pain that is worse when you first get out of bed.  Pain that gets worse after walking or standing.  How is this diagnosed? Your health care provider may diagnose a heel spur based on your symptoms and a physical exam. You may also have an X-ray of your foot to check for a bony growth coming from the calcaneus. How is this treated? Treatment aims to relieve the pain from the heel spur. This may include:  Stretching exercises.  Losing weight.  Wearing specific shoes, inserts, or orthotics for comfort and support.  Wearing splints at night to properly position your feet.  Taking over-the-counter medicine to relieve pain.  Being treated with high-intensity sound waves to break up the heel spur (extracorporeal shock wave therapy).  Getting steroid injections in your heel to reduce swelling and ease pain.  Having surgery if your heel spur causes long-term (chronic) pain.  Follow these instructions at home:  Take medicines only as directed by your health care provider.  Ask your health care provider if you should use ice or cold packs on the  painful areas of your heel or foot.  Avoid activities that cause you pain until you recover or as directed by your health care provider.  Stretch before exercising or being physically active.  Wear supportive shoes that fit well as directed by your health care provider. You might need to buy new shoes. Wearing old shoes or shoes that do not fit correctly may not provide the support that you need.  Lose weight if your health care provider thinks you should. This can relieve pressure on your foot that may be causing pain and discomfort. Contact a health care provider if:  Your pain continues or gets worse. This information is not intended to replace advice given to you by your health care provider. Make sure you discuss any questions you have with your health care provider. Document Released: 12/10/2005 Document Revised: 04/10/2016 Document Reviewed: 01/04/2014 Elsevier Interactive Patient Education  2018 [employer]     IF you received an x-ray today, you  will receive an invoice from [facility]. Please contact [facility] at [phone] with questions or concerns regarding your invoice.   IF you received labwork today, you will receive an invoice from [facility]. Please contact [facility] at [phone] with questions or concerns regarding your invoice.   Our billing staff will not be able to assist you with questions regarding bills from these companies.  You will be contacted with the lab results as soon as they are available. The fastest way to get your results is to activate your My Chart account. Instructions are located on the last page of this paperwork. If you have not heard from [place] regarding the results in 2 weeks, please contact this office.

## 2018-02-06 LAB — BASIC METABOLIC PANEL
BUN/Creatinine Ratio: 14 (ref 9–23)
BUN: 11 mg/dL (ref 6–24)
CO2: 26 mmol/L (ref 20–29)
CREATININE: 0.79 mg/dL (ref 0.57–1.00)
Calcium: 9.3 mg/dL (ref 8.7–10.2)
Chloride: 104 mmol/L (ref 96–106)
GFR calc Af Amer: 96 mL/min/{1.73_m2} (ref >59)
GFR calc non Af Amer: 83 mL/min/{1.73_m2} (ref >59)
GLUCOSE: 76 mg/dL (ref 65–99)
Potassium: 4.5 mmol/L (ref 3.5–5.2)
Sodium: 144 mmol/L (ref 134–144)

## 2018-02-06 LAB — SEDIMENTATION RATE: SED RATE: 8 mm/h (ref 0–40)

## 2018-02-06 LAB — CK: Total CK: 71 U/L (ref 24–173)

## 2018-02-09 ENCOUNTER — Telehealth: Payer: Self-pay | Admitting: *Deleted

## 2018-02-09 NOTE — Telephone Encounter (Signed)
Left messag PA approved for celebrex.

## 2018-02-16 ENCOUNTER — Encounter: Payer: Self-pay | Admitting: Physician Assistant

## 2018-05-07 ENCOUNTER — Ambulatory Visit (INDEPENDENT_AMBULATORY_CARE_PROVIDER_SITE_OTHER): Payer: BLUE CROSS/BLUE SHIELD

## 2018-05-07 ENCOUNTER — Encounter: Payer: Self-pay | Admitting: Physician Assistant

## 2018-05-07 ENCOUNTER — Ambulatory Visit (INDEPENDENT_AMBULATORY_CARE_PROVIDER_SITE_OTHER): Payer: BLUE CROSS/BLUE SHIELD | Admitting: Physician Assistant

## 2018-05-07 ENCOUNTER — Other Ambulatory Visit: Payer: Self-pay

## 2018-05-07 VITALS — BP 130/72 | HR 56 | Temp 97.8°F | Resp 16 | Ht 61.0 in | Wt 139.0 lb

## 2018-05-07 DIAGNOSIS — M545 Low back pain: Secondary | ICD-10-CM

## 2018-05-07 DIAGNOSIS — M79674 Pain in right toe(s): Secondary | ICD-10-CM

## 2018-05-07 DIAGNOSIS — M25561 Pain in right knee: Secondary | ICD-10-CM

## 2018-05-07 DIAGNOSIS — M25562 Pain in left knee: Secondary | ICD-10-CM

## 2018-05-07 DIAGNOSIS — G8929 Other chronic pain: Secondary | ICD-10-CM | POA: Diagnosis not present

## 2018-05-07 DIAGNOSIS — L609 Nail disorder, unspecified: Secondary | ICD-10-CM | POA: Diagnosis not present

## 2018-05-07 MED ORDER — MELOXICAM 15 MG PO TABS: 15.0000 mg | ORAL_TABLET | Freq: Every day | ORAL | 3 refills | Status: DC

## 2018-05-07 MED ORDER — TRAMADOL HCL 50 MG PO TABS: 50.0000 mg | ORAL_TABLET | Freq: Three times a day (TID) | ORAL | 2 refills | Status: DC | PRN

## 2018-05-07 NOTE — Progress Notes (Signed)
05/07/2018 10:18 AM   DOB: 1961-06-30 / MRN: [id number]  SUBJECTIVE:  Pam Hart is a 57 y.o. female presenting for right great toe pain. Symptoms present for 8 days.  The problem is worsening. She has tried OTC meds without relief.  She complains of a new pigmentation about the left great toenail.  Denies any trauma.  Denies a history of skin cancer.  She is out of all of her medication today. She wants me to fill everything for her. After chart review it shows that she is only out of meloxicam and tramadol.  She sees Dr. Creta LevinStallings here at PCP for chronic disease and was given a years supply of chronic meds during her last appointment.   She has No Known Allergies.   She  has a past medical history of Anemia, Arthritis, Hypertension, and Thyroid disease.    She  reports that she has never smoked. She has never used smokeless tobacco. She reports that she does not drink alcohol or use drugs. She  reports that she currently engages in sexual activity and has had partners who are Female. She reports using the following method of birth control/protection: Post-menopausal. The patient  has a past surgical history that includes Appendectomy and Eye surgery (Right, 2011).  Her family history includes Alzheimer's disease in her father; Diabetes in her sister; Glaucoma in her father; Heart disease in her mother; Hypertension in her mother and sister.  Review of Systems  Constitutional: Negative for chills, diaphoresis and fever.  Respiratory: Negative for cough, hemoptysis, sputum production, shortness of breath and wheezing.   Cardiovascular: Negative for chest pain, orthopnea and leg swelling.  Gastrointestinal: Negative for nausea.  Musculoskeletal: Positive for joint pain and myalgias. Negative for back pain, falls and neck pain.  Skin: Negative for rash.  Neurological: Negative for dizziness.    The problem list and medications were reviewed and updated by myself where necessary and  exist elsewhere in the encounter.   OBJECTIVE:  BP 130/72 (BP Location: Left Arm, Patient Position: Sitting, Cuff Size: Normal)   Pulse (!) 56   Temp 97.8 F (36.6 C) (Oral)   Resp 16   Ht 5\' 1"  (1.549 m)   Wt 139 lb (63 kg)   SpO2 99%   BMI 26.26 kg/m   Wt Readings from Last 3 Encounters:  05/07/18 139 lb (63 kg)  02/05/18 145 lb 9.6 oz (66 kg)  11/03/17 142 lb 6.4 oz (64.6 kg)   Temp Readings from Last 3 Encounters:  05/07/18 97.8 F (36.6 C) (Oral)  02/05/18 98.2 F (36.8 C) (Oral)  11/03/17 97.7 F (36.5 C) (Oral)   BP Readings from Last 3 Encounters:  05/07/18 130/72  02/05/18 130/68  11/03/17 (!) 170/88   Pulse Readings from Last 3 Encounters:  05/07/18 (!) 56  02/05/18 66  11/03/17 61    Physical Exam  Constitutional: She is oriented to person, place, and time. She appears well-nourished. No distress.  Eyes: Pupils are equal, round, and reactive to light. EOM are normal.  Cardiovascular: Normal rate, regular rhythm, S1 normal, S2 normal, normal heart sounds and intact distal pulses. Exam reveals no gallop, no friction rub and no decreased pulses.  No murmur heard. Pulmonary/Chest: Effort normal. No stridor. No respiratory distress. She has no wheezes. She has no rales.  Abdominal: She exhibits no distension.  Musculoskeletal: She exhibits no edema.       Left foot: There is normal range of motion, no tenderness, no bony  tenderness, no swelling, normal capillary refill, no crepitus, no deformity and no laceration.  Neurological: She is alert and oriented to person, place, and time. No cranial nerve deficit. Gait normal.  Skin: Skin is dry. Rash (See photo) noted. She is not diaphoretic.  Psychiatric: She has a normal mood and affect.  Vitals reviewed.      Lab Results  Component Value Date   HGBA1C 5.6 09/30/2017    Lab Results  Component Value Date   WBC 7.5 11/18/2016   HGB 13.6 11/18/2016   HCT 38.2 11/18/2016   MCV 78.1 (A) 11/18/2016    PLT 308 08/07/2016    Lab Results  Component Value Date   CREATININE 0.79 02/05/2018   BUN 11 02/05/2018   NA 144 02/05/2018   K 4.5 02/05/2018   CL 104 02/05/2018   CO2 26 02/05/2018    Lab Results  Component Value Date   ALT 18 09/30/2017   AST 15 09/30/2017   ALKPHOS 79 09/30/2017   BILITOT 0.3 09/30/2017    Lab Results  Component Value Date   TSH 2.40 07/29/2016    Lab Results  Component Value Date   CHOL 212 (H) 09/30/2017   HDL 49 09/30/2017   LDLCALC 95 09/30/2017   TRIG 341 (H) 09/30/2017   CHOLHDL 4.3 09/30/2017     ASSESSMENT AND PLAN:  Shaylon was seen today for right foot big toe and medication refill.  As it turns out she already has refills of her blood pressure medications, cholesterol medication, omeprazole.  She sees Dr. Creta Levin and have asked that she get an appointment with Dr. Creta Levin in the next 3 to 4 months for follow-up of chronic disease.  Patient here also for great toe pain after a tray fell on her foot about a week ago.  Her exam is completely normal.  More concerning is the hyperpigmentation about her left great toenail.  I am sending her to dermatology for further evaluation.  Diagnoses and all orders for this visit:  Pain of right great toe -     meloxicam (MOBIC) 15 MG tablet; Take 1 tablet (15 mg total) by mouth daily. -     DG Toe Great Right; Future  Chronic pain of both knees -     traMADol (ULTRAM) 50 MG tablet; Take 1 tablet (50 mg total) by mouth every 8 (eight) hours as needed. -     meloxicam (MOBIC) 15 MG tablet; Take 1 tablet (15 mg total) by mouth daily.  Nail abnormality: Ambulatory referral to dermatology.  The patient is advised to call or return to clinic if she does not see an improvement in symptoms, or to seek the care of the closest emergency department if she worsens with the above plan.   Deliah Boston, MHS, PA-C Primary Care at [facility] 05/07/2018 10:18 AM

## 2018-05-07 NOTE — Patient Instructions (Addendum)
Please come back and see Dr. Creta LevinStallings in about 3 to 4 months for follow-up of hypertension, cholesterol problems, GERD.   Your toe x-ray is completely normal.  Your exam is completely normal.  Meloxicam should help greatly with your pain.   IF you received an x-ray today, you will receive an invoice from [facility]. Please contact [facility] at [phone] with questions or concerns regarding your invoice.   IF you received labwork today, you will receive an invoice from [facility]. Please contact [facility] at [phone] with questions or concerns regarding your invoice.   Our billing staff will not be able to assist you with questions regarding bills from these companies.  You will be contacted with the lab results as soon as they are available. The fastest way to get your results is to activate your My Chart account. Instructions are located on the last page of this paperwork. If you have not heard from us regarding the results in 2 weeks, please contact this office.

## 2018-09-06 ENCOUNTER — Telehealth: Payer: Self-pay | Admitting: Certified Nurse Midwife

## 2018-09-06 NOTE — Telephone Encounter (Signed)
Spoke with patient in office.  She c/o 3 weeks of lower abdominal pain, pt points to below her umbilicus when asked where pain is in office. Saw PCP at [facility], (reviewed in Care Everywhere) on 08/19/18 and treated for UTI. Took macrobid and was not improved. No culture completed as only leukocytes in urine.  WBC WNL.  Patient reports urinary frequency. No dysuria. This is not new for her.  She denies vaginal bleeding, denies vaginal discharge, denies fevers.  States she has prescriptions for Mobic, ultram and meloxicam for arthritis in her neck. Not using those right now, has no pain in her neck.  Reports pain with intercourse. This a new symptom.  Normal bowel habits per patient. No nausea or vomiting. She states she has not tried anything at home for pain relief.  She states she was contacted by her PCP and asked to follow up with gyn, she came in today to request an appointment only.  Office visit scheduled for tomorrow with Leota Sauers CNM. Pt agreeable to plan. Patient left office ambulatory with steady gait. NAD noted. Speaking on the phone with her daughter without issue. Declines note for appointment reminder.  Patient is aware to seek emergency care if develops fevers, chills, nausea, vomiting, increased pain or any concerns prior to appointment tomorrow.   Routing to Dr. Vernon Prey CNM.  Will close encounter.

## 2018-09-06 NOTE — Telephone Encounter (Signed)
Patient showed at office requesting appointment due to lower abdomen pain "on both sides."

## 2018-09-07 ENCOUNTER — Other Ambulatory Visit: Payer: Self-pay

## 2018-09-07 ENCOUNTER — Encounter: Payer: Self-pay | Admitting: Certified Nurse Midwife

## 2018-09-07 ENCOUNTER — Ambulatory Visit (INDEPENDENT_AMBULATORY_CARE_PROVIDER_SITE_OTHER): Payer: BLUE CROSS/BLUE SHIELD | Admitting: Certified Nurse Midwife

## 2018-09-07 VITALS — BP 140/80 | HR 70 | Temp 97.5°F | Resp 16 | Wt 143.0 lb

## 2018-09-07 DIAGNOSIS — R3989 Other symptoms and signs involving the genitourinary system: Secondary | ICD-10-CM | POA: Diagnosis not present

## 2018-09-07 DIAGNOSIS — G8929 Other chronic pain: Secondary | ICD-10-CM | POA: Diagnosis not present

## 2018-09-07 DIAGNOSIS — R102 Pelvic and perineal pain: Secondary | ICD-10-CM | POA: Diagnosis not present

## 2018-09-07 NOTE — Patient Instructions (Signed)
Pelvic Pain, Female °Pelvic pain is pain in your lower belly (abdomen), below your belly button and between your hips. The pain may start suddenly (acute), keep coming back (recurring), or last a long time (chronic). Pelvic pain that lasts longer than six months is considered chronic. There are many causes of pelvic pain. Sometimes the cause of your pelvic pain is not known. °Follow these instructions at home: °· Take over-the-counter and prescription medicines only as told by your doctor. °· Rest as told by your doctor. °· Do not have sex it if hurts. °· Keep a journal of your pelvic pain. Write down: °? When the pain started. °? Where the pain is located. °? What seems to make the pain better or worse, such as food or your menstrual cycle. °? Any symptoms you have along with the pain. °· Keep all follow-up visits as told by your doctor. This is important. °Contact a doctor if: °· Medicine does not help your pain. °· Your pain comes back. °· You have new symptoms. °· You have unusual vaginal discharge or bleeding. °· You have a fever or chills. °· You are having a hard time pooping (constipation). °· You have blood in your pee (urine) or poop (stool). °· Your pee smells bad. °· You feel weak or lightheaded. °Get help right away if: °· You have sudden pain that is very bad. °· Your pain continues to get worse. °· You have very bad pain and also have any of the following symptoms: °? A fever. °? Feeling stick to your stomach (nausea). °? Throwing up (vomiting). °? Being very sweaty. °· You pass out (lose consciousness). °This information is not intended to replace advice given to you by your health care provider. Make sure you discuss any questions you have with your health care provider. °Document Released: 04/21/2008 Document Revised: 11/28/2015 Document Reviewed: 08/24/2015 °Elsevier Interactive Patient Education © 2018 Elsevier Inc. ° °

## 2018-09-07 NOTE — Progress Notes (Signed)
  Subjective:     Patient ID: Pam Hart, female   DOB: [birth year], 57 y.o.   MRN: [id number]  57 yo female her complaining of low abdominal pain for more than month. She was seen by PCP for urinary frequency and pain. Was treated with antibiotic, while culture was pending and completed 5 days of medication, but was told urine culture was negative. Patient continues with back ache that radiates to front all the time, but becomes worse when she wakes up and bladder is full. When she empties relieves discomfort some, but not completely. Denies vaginal bleeding or bowels issues. No vaginal dryness. Does relate she had diarrhea a couple days ago. Slight pain when occurs. Patient has continued to have low pelvic pain off and on for the past month. Did not stop when she took antibiotics for UTI. Also has pain with sexual activity at times. Some dryness vaginally. No other health issues today.    Review of Systems  Constitutional: Negative for activity change, appetite change, chills and fever.  HENT: Negative.   Eyes: Negative.   Respiratory: Negative.   Cardiovascular: Negative.   Gastrointestinal: Positive for diarrhea. Negative for abdominal distention and abdominal pain.       GERD and on medication. Increase in gas with use Diarrhea a few days ago  Endocrine: Negative.   Genitourinary: Positive for vaginal discharge and vaginal pain. Negative for dysuria, frequency, urgency and vaginal bleeding.       Had urine pain until treated with antibiotics Pain with sexual activity and some discharge  Skin: Negative.   Allergic/Immunologic: Negative.   Neurological: Negative.   Hematological: Negative.   Psychiatric/Behavioral: Negative.        Objective:   Physical Exam  Constitutional: She appears well-nourished.  Cardiovascular: Normal rate and regular rhythm.  Abdominal: Soft. Normal appearance and bowel sounds are normal. There is tenderness in the right lower quadrant and left lower  quadrant. There is no rebound, no guarding, no CVA tenderness and negative Murphy's sign.  Genitourinary: Rectum normal and uterus normal. Pelvic exam was performed with patient supine. There is no rash, tenderness, lesion or injury on the right labia. There is no rash, tenderness, lesion or injury on the left labia. Uterus is not enlarged and not tender. Cervix exhibits no motion tenderness, no discharge and no friability. Right adnexum displays no mass, no tenderness and no fullness. Left adnexum displays no mass, no tenderness and no fullness. No tenderness or bleeding in the vagina. Vaginal discharge found.  Genitourinary Comments: White slight odor, affirm taken Could not illicit pain with pelvic exam in adnexa  Lymphadenopathy: No inguinal adenopathy noted on the right or left side.  Skin: Skin is warm and dry.       Assessment:     Normal pelvic exam Chronic low pelvic pain Pain with intercourse at times Vaginal discharge    Plan:     Reviewed normal pelvic exam. Discussed due to length of time with pelvic pain PUS is warranted. Patient desires to schedule. Order will placed and patient called with insurance information and scheduled. Warnings of pelvic pain given. R/O UTI,  Lab Urine micro and culture R/O vaginal infection Affirm taken  Rv prn

## 2018-09-08 ENCOUNTER — Other Ambulatory Visit: Payer: Self-pay

## 2018-09-08 ENCOUNTER — Telehealth: Payer: Self-pay | Admitting: Certified Nurse Midwife

## 2018-09-08 LAB — VAGINITIS/VAGINOSIS, DNA PROBE
Candida Species: NEGATIVE
GARDNERELLA VAGINALIS: POSITIVE — AB
Trichomonas vaginosis: NEGATIVE

## 2018-09-08 LAB — URINALYSIS, MICROSCOPIC ONLY: Casts: NONE SEEN /LPF

## 2018-09-08 MED ORDER — METRONIDAZOLE 500 MG PO TABS: 500.0000 mg | ORAL_TABLET | Freq: Two times a day (BID) | ORAL | 0 refills | Status: DC

## 2018-09-08 NOTE — Telephone Encounter (Signed)
Call placed to patient to review benefits for recommended ultrasound. Patient understood and agreeable. Patient advises she will need to call back to schedule   cc: Leota Sauers, CNM  cc: Billie Ruddy, RN

## 2018-09-09 LAB — URINE CULTURE

## 2018-09-16 NOTE — Telephone Encounter (Signed)
Call placed to patient to follow up. Patient would like to know if this is something she needs now. Advised patient will forward to nurse to review. Patient is agreeable to a return call.  Routing to Billie Ruddy, RN

## 2018-09-21 ENCOUNTER — Encounter: Payer: Self-pay | Admitting: Family Medicine

## 2018-09-21 ENCOUNTER — Other Ambulatory Visit: Payer: Self-pay

## 2018-09-21 ENCOUNTER — Ambulatory Visit (INDEPENDENT_AMBULATORY_CARE_PROVIDER_SITE_OTHER): Payer: BLUE CROSS/BLUE SHIELD | Admitting: Family Medicine

## 2018-09-21 VITALS — BP 175/79 | HR 64 | Temp 98.2°F | Ht 61.5 in | Wt 142.0 lb

## 2018-09-21 DIAGNOSIS — I10 Essential (primary) hypertension: Secondary | ICD-10-CM | POA: Diagnosis not present

## 2018-09-21 DIAGNOSIS — M2550 Pain in unspecified joint: Secondary | ICD-10-CM | POA: Diagnosis not present

## 2018-09-21 DIAGNOSIS — M722 Plantar fascial fibromatosis: Secondary | ICD-10-CM | POA: Diagnosis not present

## 2018-09-21 MED ORDER — NAPROXEN 500 MG PO TABS: 500.0000 mg | ORAL_TABLET | Freq: Two times a day (BID) | ORAL | 0 refills | Status: DC

## 2018-09-21 NOTE — Progress Notes (Signed)
11/5/20191:29 PM  Pam Hart 11-19-1960, 57 y.o. female [id number]  Chief Complaint  Patient presents with  . Pain    pain in the legs, hands and feet for a few months. Especially stabbing pain in the left heel of foot. Taking no meds for the pain    HPI:   Patient is a 57 y.o. female with past medical h/o HTN who presents today for joint pain  States several months of joint pain, hands, elbow, feet, ankles At times red, hot and swollen Also having heel pain, left worse, specially hurts when standing up or if she has been standing for long periods of time  Xray of left foot from 01/2018 - normal, no osseous abnormalities Xray of right big toe, arthritis of 1st MTP  Has ran out of meloxicam  meloxicam was not helping with left foot pain  Has not taken BP meds today On lisinopril 10mg  and amlodipine 5mg  Reports that even when on meds her BP is still high Reports feeling well, denies any sx  Fall Risk  05/07/2018 02/05/2018 11/03/2017 09/30/2017 09/30/2017  Falls in the past year? No No No Yes No  Number falls in past yr: - - - 1 -  Injury with Fall? - - - No -     Depression screen [facility] 2/9 05/07/2018 02/05/2018 11/03/2017  Decreased Interest 0 0 0  Down, Depressed, Hopeless 0 0 0  PHQ - 2 Score 0 0 0    No Known Allergies  Prior to Admission medications   Medication Sig Start Date End Date Taking? Authorizing Provider  amLODipine (NORVASC) 5 MG tablet Take 1 tablet (5 mg total) daily by mouth. 09/30/17   Collie Siad A, MD  lisinopril (PRINIVIL,ZESTRIL) 10 MG tablet Take 1 tablet (10 mg total) daily by mouth. 09/30/17   Doristine Bosworth, MD  meloxicam (MOBIC) 15 MG tablet Take 1 tablet (15 mg total) by mouth daily. 05/07/18   Ofilia Neas, PA-C  metroNIDAZOLE (FLAGYL) 500 MG tablet Take 1 tablet (500 mg total) by mouth 2 (two) times daily. 09/08/18   Verner Chol, CNM  omeprazole (PRILOSEC) 20 MG capsule Take 1 capsule (20 mg total) daily by mouth.  09/30/17   Doristine Bosworth, MD  traMADol (ULTRAM) 50 MG tablet Take 1 tablet (50 mg total) by mouth every 8 (eight) hours as needed. 05/07/18   Silvestre Mesi    Past Medical History:  Diagnosis Date  . Anemia   . Arthritis   . Hypertension   . Thyroid disease     Past Surgical History:  Procedure Laterality Date  . APPENDECTOMY    . EYE SURGERY Right 2011   lasix     Social History   Tobacco Use  . Smoking status: Never Smoker  . Smokeless tobacco: Never Used  Substance Use Topics  . Alcohol use: No    Family History  Problem Relation Age of Onset  . Hypertension Mother   . Heart disease Mother   . Alzheimer's disease Father   . Glaucoma Father   . Diabetes Sister   . Hypertension Sister   . Colon cancer Neg Hx   . Colon polyps Neg Hx   . Esophageal cancer Neg Hx   . Kidney disease Neg Hx   . Gallbladder disease Neg Hx     ROS Per hpi  OBJECTIVE:  Blood pressure (!) 175/79, pulse 64, temperature 98.2 F (36.8 C), temperature source Oral, height 5' 1.5" (1.562  m), weight 142 lb (64.4 kg), SpO2 99 %. There is no height or weight on file to calculate BMI.   Physical Exam  Constitutional: She is oriented to person, place, and time. She appears well-developed and well-nourished.  HENT:  Head: Normocephalic and atraumatic.  Mouth/Throat: Mucous membranes are normal.  Eyes: Pupils are equal, round, and reactive to light. Conjunctivae and EOM are normal. No scleral icterus.  Neck: Neck supple.  Pulmonary/Chest: Effort normal.  Musculoskeletal: Normal range of motion. She exhibits no edema or deformity.       Feet:  Neurological: She is alert and oriented to person, place, and time.  Skin: Skin is warm and dry.  Psychiatric: She has a normal mood and affect.  Nursing note and vitals reviewed.   ASSESSMENT and PLAN  1. Arthralgia, unspecified joint - CBC with Differential - Comprehensive metabolic panel - C-reactive protein - Sedimentation  Rate - Rheumatoid factor - ANA,IFA RA Diag Pnl w/rflx Tit/Patn - Uric Acid  2. Plantar fasciitis of left foot Discussed supportive measures, new meds r/se/b and RTC precautions. Patient educational handout given.  3. Essential hypertension Uncontrolled. Has not taken BP meds today. She will go home and take BP meds.   Other orders - naproxen (NAPROSYN) 500 MG tablet; Take 1 tablet (500 mg total) by mouth 2 (two) times daily with a meal.  Return if symptoms worsen or fail to improve.    Myles Lipps, MD Primary Care at [facility] [address] [place], [place] [address] Ph.  [phone] Fax [phone]

## 2018-09-21 NOTE — Patient Instructions (Addendum)
   If you have lab work done today you will be contacted with your lab results within the next 2 weeks.  If you have not heard from us then please contact us. The fastest way to get your results is to register for My Chart.   IF you received an x-ray today, you will receive an invoice from [facility]. Please contact [facility] at [phone] with questions or concerns regarding your invoice.   IF you received labwork today, you will receive an invoice from [facility]. Please contact [facility] at [phone] with questions or concerns regarding your invoice.   Our billing staff will not be able to assist you with questions regarding bills from these companies.  You will be contacted with the lab results as soon as they are available. The fastest way to get your results is to activate your My Chart account. Instructions are located on the last page of this paperwork. If you have not heard from us regarding the results in 2 weeks, please contact this office.     Plantar Fasciitis Rehab Ask your health care provider which exercises are safe for you. Do exercises exactly as told by your health care provider and adjust them as directed. It is normal to feel mild stretching, pulling, tightness, or discomfort as you do these exercises, but you should stop right away if you feel sudden pain or your pain gets worse. Do not begin these exercises until told by your health care provider. Stretching and range of motion exercises These exercises warm up your muscles and joints and improve the movement and flexibility of your foot. These exercises also help to relieve pain. Exercise A: Plantar fascia stretch  1. Sit with your left / right leg crossed over your opposite knee. 2. Hold your heel with one hand with that thumb near your arch. With your other hand, hold your toes and gently pull them back toward the top of your foot. You should feel a stretch on the bottom of your toes or your  foot or both. 3. Hold this stretch for__________ seconds. 4. Slowly release your toes and return to the starting position. Repeat __________ times. Complete this exercise __________ times a day. Exercise B: Gastroc, standing  1. Stand with your hands against a wall. 2. Extend your left / right leg behind you, and bend your front knee slightly. 3. Keeping your heels on the floor and keeping your back knee straight, shift your weight toward the wall without arching your back. You should feel a gentle stretch in your left / right calf. 4. Hold this position for __________ seconds. Repeat __________ times. Complete this exercise __________ times a day. Exercise C: Soleus, standing 1. Stand with your hands against a wall. 2. Extend your left / right leg behind you, and bend your front knee slightly. 3. Keeping your heels on the floor, bend your back knee and slightly shift your weight over the back leg. You should feel a gentle stretch deep in your calf. 4. Hold this position for __________ seconds. Repeat __________ times. Complete this exercise __________ times a day. Exercise D: Gastrocsoleus, standing 1. Stand with the ball of your left / right foot on a step. The ball of your foot is on the walking surface, right under your toes. 2. Keep your other foot firmly on the same step. 3. Hold onto the wall or a railing for balance. 4. Slowly lift your other foot, allowing your body weight to press your heel down   over the edge of the step. You should feel a stretch in your left / right calf. 5. Hold this position for __________ seconds. 6. Return both feet to the step. 7. Repeat this exercise with a slight bend in your left / right knee. Repeat __________ times with your left / right knee straight and __________ times with your left / right knee bent. Complete this exercise __________ times a day. Balance exercise This exercise builds your balance and strength control of your arch to help take  pressure off your plantar fascia. Exercise E: Single leg stand 1. Without shoes, stand near a railing or in a doorway. You may hold onto the railing or door frame as needed. 2. Stand on your left / right foot. Keep your big toe down on the floor and try to keep your arch lifted. Do not let your foot roll inward. 3. Hold this position for __________ seconds. 4. If this exercise is too easy, you can try it with your eyes closed or while standing on a pillow. Repeat __________ times. Complete this exercise __________ times a day. This information is not intended to replace advice given to you by your health care provider. Make sure you discuss any questions you have with your health care provider. Document Released: 11/03/2005 Document Revised: 07/08/2016 Document Reviewed: 09/17/2015 Elsevier Interactive Patient Education  2018 [employer]  Plantar Fasciitis Plantar fasciitis is a painful foot condition that affects the heel. It occurs when the band of tissue that connects the toes to the heel bone (plantar fascia) becomes irritated. This can happen after exercising too much or doing other repetitive activities (overuse injury). The pain from plantar fasciitis can range from mild irritation to severe pain that makes it difficult for you to walk or move. The pain is usually worse in the morning or after you have been sitting or lying down for a while. What are the causes? This condition may be caused by:  Standing for long periods of time.  Wearing shoes that do not fit.  Doing high-impact activities, including running, aerobics, and ballet.  Being overweight.  Having an abnormal way of walking (gait).  Having tight calf muscles.  Having high arches in your feet.  Starting a new athletic activity.  What are the signs or symptoms? The main symptom of this condition is heel pain. Other symptoms include:  Pain that gets worse after activity or exercise.  Pain that is worse in the  morning or after resting.  Pain that goes away after you walk for a few minutes.  How is this diagnosed? This condition may be diagnosed based on your signs and symptoms. Your health care provider will also do a physical exam to check for:  A tender area on the bottom of your foot.  A high arch in your foot.  Pain when you move your foot.  Difficulty moving your foot.  You may also need to have imaging studies to confirm the diagnosis. These can include:  X-rays.  Ultrasound.  MRI.  How is this treated? Treatment for plantar fasciitis depends on the severity of the condition. Your treatment may include:  Rest, ice, and over-the-counter pain medicines to manage your pain.  Exercises to stretch your calves and your plantar fascia.  A splint that holds your foot in a stretched, upward position while you sleep (night splint).  Physical therapy to relieve symptoms and prevent problems in the future.  Cortisone injections to relieve severe pain.  Extracorporeal shock wave  therapy (ESWT) to stimulate damaged plantar fascia with electrical impulses. It is often used as a last resort before surgery.  Surgery, if other treatments have not worked after 12 months.  Follow these instructions at home:  Take medicines only as directed by your health care provider.  Avoid activities that cause pain.  Roll the bottom of your foot over a bag of ice or a bottle of cold water. Do this for 20 minutes, 3-4 times a day.  Perform simple stretches as directed by your health care provider.  Try wearing athletic shoes with air-sole or gel-sole cushions or soft shoe inserts.  Wear a night splint while sleeping, if directed by your health care provider.  Keep all follow-up appointments with your health care provider. How is this prevented?  Do not perform exercises or activities that cause heel pain.  Consider finding low-impact activities if you continue to have problems.  Lose  weight if you need to. The best way to prevent plantar fasciitis is to avoid the activities that aggravate your plantar fascia. Contact a health care provider if:  Your symptoms do not go away after treatment with home care measures.  Your pain gets worse.  Your pain affects your ability to move or do your daily activities. This information is not intended to replace advice given to you by your health care provider. Make sure you discuss any questions you have with your health care provider. Document Released: 07/29/2001 Document Revised: 04/07/2016 Document Reviewed: 09/13/2014 Elsevier Interactive Patient Education  [employer].

## 2018-09-22 LAB — COMPREHENSIVE METABOLIC PANEL
ALT: 15 IU/L (ref 0–32)
AST: 14 IU/L (ref 0–40)
Albumin/Globulin Ratio: 1.7 (ref 1.2–2.2)
Albumin: 4.3 g/dL (ref 3.5–5.5)
Alkaline Phosphatase: 70 IU/L (ref 39–117)
BUN/Creatinine Ratio: 17 (ref 9–23)
BUN: 9 mg/dL (ref 6–24)
Bilirubin Total: <0.2 mg/dL (ref 0.0–1.2)
CO2: 26 mmol/L (ref 20–29)
Calcium: 9.5 mg/dL (ref 8.7–10.2)
Chloride: 101 mmol/L (ref 96–106)
Creatinine, Ser: 0.52 mg/dL — ABNORMAL LOW (ref 0.57–1.00)
GFR calc Af Amer: 122 mL/min/{1.73_m2} (ref >59)
GFR calc non Af Amer: 106 mL/min/{1.73_m2} (ref >59)
Globulin, Total: 2.6 g/dL (ref 1.5–4.5)
Glucose: 93 mg/dL (ref 65–99)
Potassium: 4.1 mmol/L (ref 3.5–5.2)
Sodium: 142 mmol/L (ref 134–144)
Total Protein: 6.9 g/dL (ref 6.0–8.5)

## 2018-09-22 LAB — CBC WITH DIFFERENTIAL/PLATELET
Basophils Absolute: 0 10*3/uL (ref 0.0–0.2)
Basos: 0 %
EOS (ABSOLUTE): 0.1 10*3/uL (ref 0.0–0.4)
Eos: 1 %
Hematocrit: 40 % (ref 34.0–46.6)
Hemoglobin: 13.5 g/dL (ref 11.1–15.9)
Immature Grans (Abs): 0 10*3/uL (ref 0.0–0.1)
Immature Granulocytes: 0 %
Lymphocytes Absolute: 2.6 10*3/uL (ref 0.7–3.1)
Lymphs: 39 %
MCH: 27.5 pg (ref 26.6–33.0)
MCHC: 33.8 g/dL (ref 31.5–35.7)
MCV: 82 fL (ref 79–97)
Monocytes Absolute: 0.4 10*3/uL (ref 0.1–0.9)
Monocytes: 6 %
Neutrophils Absolute: 3.7 10*3/uL (ref 1.4–7.0)
Neutrophils: 54 %
Platelets: 324 10*3/uL (ref 150–450)
RBC: 4.91 x10E6/uL (ref 3.77–5.28)
RDW: 13.8 % (ref 12.3–15.4)
WBC: 6.8 10*3/uL (ref 3.4–10.8)

## 2018-09-22 LAB — C-REACTIVE PROTEIN: CRP: 10 mg/L (ref 0–10)

## 2018-09-22 LAB — ANA,IFA RA DIAG PNL W/RFLX TIT/PATN
ANA Titer 1: NEGATIVE
Cyclic Citrullin Peptide Ab: 6 units (ref 0–19)
Rhuematoid fact SerPl-aCnc: <10 IU/mL (ref 0.0–13.9)

## 2018-09-22 LAB — URIC ACID: Uric Acid: 4.6 mg/dL (ref 2.5–7.1)

## 2018-09-22 LAB — SEDIMENTATION RATE: Sed Rate: 32 mm/hr (ref 0–40)

## 2018-09-25 ENCOUNTER — Encounter: Payer: Self-pay | Admitting: Radiology

## 2018-09-27 NOTE — Telephone Encounter (Signed)
Follow-up call to patient.  States pelvic pain and pain with intercourse are completely resolved.  Denies any pelvic pain and declines follow-up or ultrasound. Highly encouraged to call back if any return of symptoms so she can have evaluation.  Advised will send message to provider for review.

## 2018-09-28 NOTE — Telephone Encounter (Signed)
Reviewed phone call note and patient really felt like this was coming from sexual activity, per conversation, but had hard timing discussing this. Was treated for vaginal infection. Agree with recommendation if any change to come in for PUS.

## 2018-09-28 NOTE — Telephone Encounter (Signed)
Order cancelled. Encounter closed.  CC: Harland DingwallSuzy Dixon.

## 2018-10-06 ENCOUNTER — Other Ambulatory Visit: Payer: Self-pay | Admitting: Family Medicine

## 2018-10-06 DIAGNOSIS — K219 Gastro-esophageal reflux disease without esophagitis: Secondary | ICD-10-CM

## 2018-10-06 NOTE — Telephone Encounter (Signed)
Original prescription has expired.Thanks.

## 2018-10-13 ENCOUNTER — Other Ambulatory Visit: Payer: Self-pay | Admitting: Family Medicine

## 2018-10-13 NOTE — Telephone Encounter (Signed)
Requested Prescriptions  Pending Prescriptions Disp Refills  . naproxen (NAPROSYN) 500 MG tablet [Pharmacy Med Name: NAPROXEN 500 MG TABLET] 60 tablet 0    Sig: TAKE 1 TABLET BY MOUTH TWICE A DAY WITH A MEAL     Analgesics:  NSAIDS Failed - 10/13/2018  3:15 AM      Failed - Cr in normal range and within 360 days    Creat  Date Value Ref Range Status  08/07/2016 0.74 0.50 - 1.05 mg/dL Final    Comment:      For patients > or = 58 years of age: The upper reference limit for Creatinine is approximately 13% higher for people identified as African-American.      Creatinine, Ser  Date Value Ref Range Status  09/21/2018 0.52 (L) 0.57 - 1.00 mg/dL Final         Passed - HGB in normal range and within 360 days    Hemoglobin  Date Value Ref Range Status  09/21/2018 13.5 11.1 - 15.9 g/dL Final         Passed - Patient is not pregnant      Passed - Valid encounter within last 12 months    Recent Outpatient Visits          3 weeks ago Arthralgia, unspecified joint   [facility], Meda CoffeeIrma M, MD   5 months ago Pain of right great toe   [facility], Marolyn HammockMichael L, PA-C   8 months ago Pain of left heel   [facility], [place], [place]   11 months ago Influenza-like illness   [facility], Madelaine BhatElizabeth Whitney, PA-C   1 year ago Health maintenance examination   [facility], Manus RuddZoe A, MD

## 2018-11-02 ENCOUNTER — Ambulatory Visit: Payer: BLUE CROSS/BLUE SHIELD | Admitting: Certified Nurse Midwife

## 2018-11-07 ENCOUNTER — Other Ambulatory Visit: Payer: Self-pay | Admitting: Family Medicine

## 2018-11-07 DIAGNOSIS — I10 Essential (primary) hypertension: Secondary | ICD-10-CM

## 2018-11-08 ENCOUNTER — Other Ambulatory Visit: Payer: Self-pay | Admitting: *Deleted

## 2018-11-08 NOTE — Telephone Encounter (Signed)
Spoke with Baird Lyonsasey, Pharmacist

## 2018-11-08 NOTE — Telephone Encounter (Signed)
Requested medication (s) are due for refill today: Yes  Requested medication (s) are on the active medication list: Yes  Last refill:  09/30/17  Future visit scheduled: No  Notes to clinic:  Prescription has expired.    Requested Prescriptions  Pending Prescriptions Disp Refills   lisinopril (PRINIVIL,ZESTRIL) 10 MG tablet [Pharmacy Med Name: LISINOPRIL 10 MG TABLET] 90 tablet 1    Sig: TAKE 1 TABLET BY MOUTH EVERY DAY     Cardiovascular:  ACE Inhibitors Failed - 11/07/2018  1:15 AM      Failed - Cr in normal range and within 180 days    Creat  Date Value Ref Range Status  08/07/2016 0.74 0.50 - 1.05 mg/dL Final    Comment:      For patients > or = 58 years of age: The upper reference limit for Creatinine is approximately 13% higher for people identified as African-American.      Creatinine, Ser  Date Value Ref Range Status  09/21/2018 0.52 (L) 0.57 - 1.00 mg/dL Final         Failed - Last BP in normal range    BP Readings from Last 1 Encounters:  09/21/18 (!) 175/79         Failed - Valid encounter within last 6 months    Recent Outpatient Visits          1 month ago Arthralgia, unspecified joint   [facility], Meda CoffeeIrma M, MD   6 months ago Pain of right great toe   [facility], Marolyn HammockMichael L, PA-C   9 months ago Pain of left heel   [facility], [place], [place]   1 year ago Influenza-like illness   [facility], Madelaine BhatElizabeth Whitney, PA-C   1 year ago Health maintenance examination   [facility], [place], [place]             Passed - K in normal range and within 180 days    Potassium  Date Value Ref Range Status  09/21/2018 4.1 3.5 - 5.2 mmol/L Final         Passed - Patient is not pregnant

## 2018-11-13 ENCOUNTER — Other Ambulatory Visit: Payer: Self-pay | Admitting: Family Medicine

## 2018-11-13 DIAGNOSIS — I10 Essential (primary) hypertension: Secondary | ICD-10-CM

## 2018-11-15 NOTE — Telephone Encounter (Signed)
LOV 09-21-2018 with Dr. Leretha PolSantiago / Refill request for Norvasc /

## 2018-11-23 ENCOUNTER — Encounter: Payer: Self-pay | Admitting: Family Medicine

## 2018-11-23 ENCOUNTER — Ambulatory Visit: Payer: BLUE CROSS/BLUE SHIELD | Admitting: Family Medicine

## 2018-11-23 VITALS — BP 181/82 | HR 70 | Temp 97.7°F | Resp 17 | Ht 61.5 in | Wt 148.0 lb

## 2018-11-23 DIAGNOSIS — M722 Plantar fascial fibromatosis: Secondary | ICD-10-CM | POA: Diagnosis not present

## 2018-11-23 DIAGNOSIS — K219 Gastro-esophageal reflux disease without esophagitis: Secondary | ICD-10-CM | POA: Diagnosis not present

## 2018-11-23 DIAGNOSIS — G5603 Carpal tunnel syndrome, bilateral upper limbs: Secondary | ICD-10-CM

## 2018-11-23 DIAGNOSIS — I10 Essential (primary) hypertension: Secondary | ICD-10-CM | POA: Diagnosis not present

## 2018-11-23 MED ORDER — TRAMADOL HCL 50 MG PO TABS: 50.0000 mg | ORAL_TABLET | Freq: Two times a day (BID) | ORAL | 0 refills | Status: DC | PRN

## 2018-11-23 MED ORDER — LISINOPRIL 20 MG PO TABS: 20.0000 mg | ORAL_TABLET | Freq: Every day | ORAL | 3 refills | Status: DC

## 2018-11-23 MED ORDER — PANTOPRAZOLE SODIUM 40 MG PO TBEC: 40.0000 mg | DELAYED_RELEASE_TABLET | Freq: Every day | ORAL | 3 refills | Status: DC

## 2018-11-23 MED ORDER — NAPROXEN 500 MG PO TABS: ORAL_TABLET | ORAL | 2 refills | Status: DC

## 2018-11-23 MED ORDER — GABAPENTIN 300 MG PO CAPS: 300.0000 mg | ORAL_CAPSULE | Freq: Every day | ORAL | 3 refills | Status: DC

## 2018-11-23 MED ORDER — AMLODIPINE BESYLATE 10 MG PO TABS: 5.0000 mg | ORAL_TABLET | Freq: Every day | ORAL | 2 refills | Status: DC

## 2018-11-23 NOTE — Patient Instructions (Signed)
° ° ° °  If you have lab work done today you will be contacted with your lab results within the next 2 weeks.  If you have not heard from us then please contact us. The fastest way to get your results is to register for My Chart. ° ° °IF you received an x-ray today, you will receive an invoice from [facility]. Please contact [facility] at [phone] with questions or concerns regarding your invoice.  ° °IF you received labwork today, you will receive an invoice from [facility]. Please contact [facility] at [phone] with questions or concerns regarding your invoice.  ° °Our billing staff will not be able to assist you with questions regarding bills from these companies. ° °You will be contacted with the lab results as soon as they are available. The fastest way to get your results is to activate your My Chart account. Instructions are located on the last page of this paperwork. If you have not heard from us regarding the results in 2 weeks, please contact this office. °  ° ° ° °

## 2018-11-23 NOTE — Progress Notes (Signed)
1/7/20206:20 PM  Pam Hart [birth year], 58 y.o. female [id number]  Chief Complaint  Patient presents with  . Hand Pain  . Foot Pain  . Hypertension    HPI:   Patient is a 58 y.o. female with past medical history significant for HTN, arthralgia and gerd who presents today for routine follow-up  Continues to have pain in her arms and legs Feels naproxen helps some, used to Take tramadol prn Has restarted meloxicam after she ran out of naproxen Inflammatory arthritis labs done in nov 2019 - normal Pain wakes her up in the middle of the night - sharp stabbing pain Has pain in bottom of left heel and calf  Having numbness and tingling at in both her hands radiates up her forearms Had carpal tunnel surgery in both hands about 15 years ago  Normally takes BP meds at 4pm Did take yesterday Has been checking in the pharmacy and it has has been high  Having worsening gerd despite taking omperazole Has elevated head of bed Having reflux after meals Feels omeprazole makes her very gassy and bloated  Fall Risk  11/23/2018 09/21/2018 05/07/2018 02/05/2018 11/03/2017  Falls in the past year? 0 0 No No No  Number falls in past yr: - - - - -  Injury with Fall? - - - - -     Depression screen [facility] 2/9 11/23/2018 05/07/2018 02/05/2018  Decreased Interest 0 0 0  Down, Depressed, Hopeless 0 0 0  PHQ - 2 Score 0 0 0  Altered sleeping 0 - -  Tired, decreased energy 0 - -  Change in appetite 0 - -  Feeling bad or failure about yourself  0 - -  Trouble concentrating 0 - -  Moving slowly or fidgety/restless 0 - -  Suicidal thoughts 0 - -  PHQ-9 Score 0 - -  Difficult doing work/chores Not difficult at all - -    No Known Allergies  Prior to Admission medications   Medication Sig Start Date End Date Taking? Authorizing Provider  amLODipine (NORVASC) 5 MG tablet TAKE 1 TABLET BY MOUTH EVERY DAY 11/15/18  Yes Myles Lipps, MD  lisinopril (PRINIVIL,ZESTRIL) 10 MG tablet TAKE 1 TABLET  BY MOUTH EVERY DAY 11/08/18  Yes Myles Lipps, MD  naproxen (NAPROSYN) 500 MG tablet TAKE 1 TABLET BY MOUTH TWICE A DAY WITH A MEAL 11/15/18  Yes Myles Lipps, MD  omeprazole (PRILOSEC) 20 MG capsule TAKE 1 CAPSULE BY MOUTH EVERY DAY 10/06/18  Yes Collie Siad A, MD  traMADol (ULTRAM) 50 MG tablet Take 1 tablet (50 mg total) by mouth every 8 (eight) hours as needed. 05/07/18  Yes Silvestre Mesi    Past Medical History:  Diagnosis Date  . Anemia   . Arthritis   . Hypertension   . Thyroid disease     Past Surgical History:  Procedure Laterality Date  . APPENDECTOMY    . EYE SURGERY Right 2011   lasix     Social History   Tobacco Use  . Smoking status: Never Smoker  . Smokeless tobacco: Never Used  Substance Use Topics  . Alcohol use: No    Family History  Problem Relation Age of Onset  . Hypertension Mother   . Heart disease Mother   . Alzheimer's disease Father   . Glaucoma Father   . Diabetes Sister   . Hypertension Sister   . Colon cancer Neg Hx   . Colon polyps Neg Hx   .  Esophageal cancer Neg Hx   . Kidney disease Neg Hx   . Gallbladder disease Neg Hx     Review of Systems  Constitutional: Negative for chills and fever.  Respiratory: Negative for cough and shortness of breath.   Cardiovascular: Negative for chest pain, palpitations and leg swelling.   Per hpi  OBJECTIVE:  Blood pressure (!) 181/82, pulse 70, temperature 97.7 F (36.5 C), temperature source Oral, resp. rate 17, height 5' 1.5" (1.562 m), weight 148 lb (67.1 kg), SpO2 98 %. Body mass index is 27.51 kg/m.   BP Readings from Last 3 Encounters:  11/23/18 (!) 181/82  09/21/18 (!) 175/79  09/07/18 140/80    Physical Exam Vitals signs and nursing note reviewed.  Constitutional:      Appearance: She is well-developed.  HENT:     Head: Normocephalic and atraumatic.     Mouth/Throat:     Pharynx: No oropharyngeal exudate.  Eyes:     General: No scleral icterus.     Conjunctiva/sclera: Conjunctivae normal.     Pupils: Pupils are equal, round, and reactive to light.  Neck:     Musculoskeletal: Neck supple.  Cardiovascular:     Rate and Rhythm: Normal rate and regular rhythm.     Heart sounds: Normal heart sounds. No murmur. No friction rub. No gallop.   Pulmonary:     Effort: Pulmonary effort is normal.     Breath sounds: Normal breath sounds. No wheezing or rales.  Musculoskeletal:     Right wrist: She exhibits normal range of motion, no tenderness and no bony tenderness.     Left wrist: She exhibits normal range of motion, no tenderness and no bony tenderness.     Left ankle: Normal. Achilles tendon normal.     Left hand: Decreased sensation noted. Decreased sensation is present in the medial distribution.     Left foot: Tenderness (over heel and along 5th metatarsal) present.     Comments: Positive tinel and phalen, both wrists  Skin:    General: Skin is warm and dry.  Neurological:     Mental Status: She is alert and oriented to person, place, and time.     ASSESSMENT and PLAN 1. Plantar fasciitis of left foot Discussed supportive measures. Stopping nsaids due to worsening gerd. Therefore will do otc apap and tramadol prn for severe pain. Reviewed r/se/b. Reviewed pmp. - Ambulatory referral to Podiatry - traMADol (ULTRAM) 50 MG tablet; Take 1 tablet (50 mg total) by mouth every 12 (twelve) hours as needed.  2. Essential hypertension Not controlled. Increasing medications, reviewed LFM - amLODipine (NORVASC) 10 MG tablet; Take 0.5 tablets (5 mg total) by mouth daily. - lisinopril (PRINIVIL,ZESTRIL) 20 MG tablet; Take 1 tablet (20 mg total) by mouth daily.  3. Bilateral carpal tunnel syndrome Discussed supportive measures, new meds r/se/b, placed in braces. Starting gabapentin, reviewed r/se/b - Splint wrist  4. Gastroesophageal reflux disease, esophagitis presence not specified Not controlled. Stopping nsaids. Changing to pantoprazole  due to side effects of omeprazole.   Other orders - gabapentin (NEURONTIN) 300 MG capsule; Take 1-2 capsules (300-600 mg total) by mouth at bedtime. - pantoprazole (PROTONIX) 40 MG tablet; Take 1 tablet (40 mg total) by mouth daily.  Return in about 6 weeks (around 01/04/2019) for HTN.    Myles Lipps, MD Primary Care at [facility] [address] [place], [place] [address] Ph.  [phone] Fax [phone]

## 2018-11-30 ENCOUNTER — Ambulatory Visit: Payer: BLUE CROSS/BLUE SHIELD | Admitting: Podiatry

## 2018-12-02 ENCOUNTER — Ambulatory Visit: Payer: BLUE CROSS/BLUE SHIELD | Admitting: Podiatry

## 2018-12-02 ENCOUNTER — Ambulatory Visit (INDEPENDENT_AMBULATORY_CARE_PROVIDER_SITE_OTHER): Payer: BLUE CROSS/BLUE SHIELD

## 2018-12-02 ENCOUNTER — Encounter: Payer: Self-pay | Admitting: Podiatry

## 2018-12-02 DIAGNOSIS — M722 Plantar fascial fibromatosis: Secondary | ICD-10-CM

## 2018-12-02 DIAGNOSIS — M21619 Bunion of unspecified foot: Secondary | ICD-10-CM

## 2018-12-02 MED ORDER — METHYLPREDNISOLONE 4 MG PO TBPK: ORAL_TABLET | ORAL | 0 refills | Status: DC

## 2018-12-02 NOTE — Progress Notes (Signed)
   Subjective:    Patient ID: Pam Hart, female    DOB: 1961-07-17, 58 y.o.   MRN: [id number]  HPI  58 year old female presents the office today for concerns of pain to the bottom of her left heel.  She states that she gets pain in the morning when she first gets up after sitting and gets better with activity.  She denies any recent injury or trauma.  This is been ongoing for the last 6 months.  The pain does not wake her up at night.  No significant treatment.  She has no other concerns.  Review of Systems  All other systems reviewed and are negative.  Past Medical History:  Diagnosis Date  . Anemia   . Arthritis   . Hypertension   . Thyroid disease     Past Surgical History:  Procedure Laterality Date  . APPENDECTOMY    . EYE SURGERY Right 2011   lasix      Current Outpatient Medications:  .  amLODipine (NORVASC) 10 MG tablet, Take 0.5 tablets (5 mg total) by mouth daily., Disp: 30 tablet, Rfl: 2 .  gabapentin (NEURONTIN) 300 MG capsule, Take 1-2 capsules (300-600 mg total) by mouth at bedtime., Disp: 60 capsule, Rfl: 3 .  lisinopril (PRINIVIL,ZESTRIL) 20 MG tablet, Take 1 tablet (20 mg total) by mouth daily., Disp: 30 tablet, Rfl: 3 .  methylPREDNISolone (MEDROL DOSEPAK) 4 MG TBPK tablet, Take as directed, Disp: 21 tablet, Rfl: 0 .  pantoprazole (PROTONIX) 40 MG tablet, Take 1 tablet (40 mg total) by mouth daily., Disp: 30 tablet, Rfl: 3 .  traMADol (ULTRAM) 50 MG tablet, Take 1 tablet (50 mg total) by mouth every 12 (twelve) hours as needed., Disp: 30 tablet, Rfl: 0  No Known Allergies       Objective:   Physical Exam  General: AAO x3, NAD  Dermatological: Skin is warm, dry and supple bilateral. Nails x 10 are well manicured; remaining integument appears unremarkable at this time. There are no open sores, no preulcerative lesions, no rash or signs of infection present.  Vascular: Dorsalis Pedis artery and Posterior Tibial artery pedal pulses are 2/4 bilateral  with immedate capillary fill time. Pedal hair growth present. No varicosities and no lower extremity edema present bilateral. There is no pain with calf compression, swelling, warmth, erythema.   Neruologic: Grossly intact via light touch bilateral.  Protective threshold with Semmes Wienstein monofilament intact to all pedal sites bilateral.  Negative Tinel sign.  Musculoskeletal: There is tenderness palpation of the plantar medial tubercle of the calcaneus at the insertion of the plantar fascia on the left side.  Plantar fascial with lateral compression of calcaneus.  Achilles tendon appears to be intact.  No other areas of tenderness.  There is bunion deformity present as well as flatfoot.  Muscular strength 5/5 in all groups tested bilateral.  Gait: Unassisted, Nonantalgic.     Assessment & Plan:  57 year old female left heel pain, plantar fasciitis -Treatment options discussed including all alternatives, risks, and complications -Etiology of symptoms were discussed -X-rays were obtained and reviewed with the patient.  No evidence of acute fracture or stress fracture.  Bunion is present.  Flatfoot present. -Prescribed a Medrol Dosepak. -Plantar fascial brace dispensed. -Discussed shoe modifications and orthotics -Stretching, icing daily.  Vivi Barrack DPM

## 2018-12-02 NOTE — Patient Instructions (Signed)

## 2018-12-05 ENCOUNTER — Other Ambulatory Visit: Payer: Self-pay | Admitting: Family Medicine

## 2018-12-05 DIAGNOSIS — K219 Gastro-esophageal reflux disease without esophagitis: Secondary | ICD-10-CM

## 2018-12-10 ENCOUNTER — Ambulatory Visit: Payer: BLUE CROSS/BLUE SHIELD | Admitting: Family Medicine

## 2018-12-10 ENCOUNTER — Telehealth: Payer: Self-pay | Admitting: Family Medicine

## 2018-12-10 NOTE — Telephone Encounter (Signed)
Called and LVM for pt to call the office and reschedule her appt that was scheduled for today 12/10/18 with Dr. Leretha Pol. Leretha Pol is out sick so we will need to get her rescheduled. When pt calls back, please reschedule at her convenience.

## 2018-12-28 ENCOUNTER — Telehealth: Payer: Self-pay

## 2018-12-28 NOTE — Telephone Encounter (Signed)
Left message for call back.

## 2018-12-28 NOTE — Telephone Encounter (Signed)
-----   Message from Verner Chol, CNM sent at 12/27/2018  6:16 AM EST ----- Regarding: mammogram overdue Has not had mammogram since 2018, order expired

## 2018-12-30 ENCOUNTER — Ambulatory Visit: Payer: BLUE CROSS/BLUE SHIELD | Admitting: Podiatry

## 2018-12-30 NOTE — Telephone Encounter (Signed)
Left message for call back.

## 2018-12-31 NOTE — Telephone Encounter (Signed)
No callback. Okay to close encounter?

## 2019-01-04 ENCOUNTER — Ambulatory Visit: Payer: BLUE CROSS/BLUE SHIELD | Admitting: Family Medicine

## 2019-01-05 NOTE — Telephone Encounter (Signed)
She has been notified and discussed at visit this is overdue. Close encounter

## 2019-02-15 ENCOUNTER — Other Ambulatory Visit: Payer: Self-pay | Admitting: Family Medicine

## 2019-02-15 DIAGNOSIS — I10 Essential (primary) hypertension: Secondary | ICD-10-CM

## 2019-03-01 ENCOUNTER — Other Ambulatory Visit: Payer: Self-pay | Admitting: Family Medicine

## 2019-03-01 ENCOUNTER — Telehealth: Payer: Self-pay | Admitting: Family Medicine

## 2019-03-01 DIAGNOSIS — I10 Essential (primary) hypertension: Secondary | ICD-10-CM

## 2019-03-01 NOTE — Telephone Encounter (Signed)
Requested medication (s) are due for refill today:  yes  Requested medication (s) are on the active medication list:  yes  Future visit scheduled:  No; called pt.; [facility] is out of network; transferred to office representative to discuss cost of self pay  Last Refill: 02/15/19; #30; no refills  Pt. stated she will eventually seek out a new provider, after the COVID 19 Pandemic has passed.   Requested Prescriptions  Pending Prescriptions Disp Refills   lisinopril (PRINIVIL,ZESTRIL) 20 MG tablet [Pharmacy Med Name: LISINOPRIL 20 MG TABLET] 90 tablet 1    Sig: TAKE 1 TABLET BY MOUTH EVERY DAY     Cardiovascular:  ACE Inhibitors Failed - 03/01/2019  3:29 PM      Failed - Cr in normal range and within 180 days    Creat  Date Value Ref Range Status  08/07/2016 0.74 0.50 - 1.05 mg/dL Final    Comment:      For patients > or = 59 years of age: The upper reference limit for Creatinine is approximately 13% higher for people identified as African-American.      Creatinine, Ser  Date Value Ref Range Status  09/21/2018 0.52 (L) 0.57 - 1.00 mg/dL Final         Failed - Last BP in normal range    BP Readings from Last 1 Encounters:  11/23/18 (!) 181/82         Failed - Valid encounter within last 6 months    Recent Outpatient Visits          3 months ago Plantar fasciitis of left foot   [facility], Meda Coffee, MD   5 months ago Arthralgia, unspecified joint   [facility], Meda Coffee, MD   9 months ago Pain of right great toe   [facility], Marolyn Hammock, PA-C   1 year ago Pain of left heel   Primary Care at [facility], [place], [place]   1 year ago Influenza-like illness   [facility] at [facility], [place], PA-C             Passed - K in normal range and within 180 days    Potassium  Date Value Ref Range Status  09/21/2018 4.1 3.5 - 5.2 mmol/L Final         Passed - Patient is not pregnant

## 2019-03-01 NOTE — Telephone Encounter (Signed)
Pt would like a refill on her bp meds and med for her stomach. A pec nurse called to check on pt. Pt needs these meds: however, we do not accept [employer] local. Pt can not afford an OV at this time. I gave her information on which urgent care takes her insurance. She doesn't want to go to another doctor during the pandemic. She didn't know the names of the meds she wants. Please advise. I informed her she may not get these filled.

## 2019-03-02 ENCOUNTER — Other Ambulatory Visit: Payer: Self-pay | Admitting: Family Medicine

## 2019-03-02 ENCOUNTER — Other Ambulatory Visit: Payer: Self-pay

## 2019-03-02 DIAGNOSIS — I10 Essential (primary) hypertension: Secondary | ICD-10-CM

## 2019-03-02 MED ORDER — PANTOPRAZOLE SODIUM 40 MG PO TBEC: 40.0000 mg | DELAYED_RELEASE_TABLET | Freq: Every day | ORAL | 0 refills | Status: DC

## 2019-03-02 MED ORDER — AMLODIPINE BESYLATE 10 MG PO TABS: 5.0000 mg | ORAL_TABLET | Freq: Every day | ORAL | 0 refills | Status: DC

## 2019-03-02 NOTE — Telephone Encounter (Signed)
Called patient but got no answer couldn't leave VM but sent in 30 day supply of both meds but will not be able to fill anymore due to her no longer being able to be our patient.

## 2019-03-03 ENCOUNTER — Other Ambulatory Visit: Payer: Self-pay | Admitting: Family Medicine

## 2019-03-03 DIAGNOSIS — I10 Essential (primary) hypertension: Secondary | ICD-10-CM

## 2019-03-25 ENCOUNTER — Other Ambulatory Visit: Payer: Self-pay | Admitting: Family Medicine

## 2019-03-25 DIAGNOSIS — I10 Essential (primary) hypertension: Secondary | ICD-10-CM

## 2019-04-28 NOTE — Telephone Encounter (Signed)
Patient has [facility]	[place], she has to been seen at [facility] facility

## 2020-01-11 ENCOUNTER — Other Ambulatory Visit: Payer: Self-pay | Admitting: Family Medicine

## 2020-01-11 DIAGNOSIS — I10 Essential (primary) hypertension: Secondary | ICD-10-CM

## 2020-01-11 NOTE — Telephone Encounter (Signed)
Requested medication (s) are due for refill today: yes  Requested medication (s) are on the active medication list: yes  Last refill:  10/16/2019  Future visit scheduled: no  Notes to clinic:  no valid encounter within last 6 months   Requested Prescriptions  Pending Prescriptions Disp Refills   amLODipine (NORVASC) 10 MG tablet [Pharmacy Med Name: AMLODIPINE BESYLATE 10 MG TAB] 45 tablet 1    Sig: TAKE 1/2 TABLET BY MOUTH DAILY      Cardiovascular:  Calcium Channel Blockers Failed - 01/11/2020  1:26 AM      Failed - Last BP in normal range    BP Readings from Last 1 Encounters:  11/23/18 (!) 181/82          Failed - Valid encounter within last 6 months    Recent Outpatient Visits           1 year ago Plantar fasciitis of left foot   [facility], Meda Coffee, MD   1 year ago Arthralgia, unspecified joint   [facility], Meda Coffee, MD   1 year ago Pain of right great toe   [facility], Marolyn Hammock, PA-C   1 year ago Pain of left heel   Primary Care at [facility], [place], [place]   2 years ago Influenza-like illness   Primary Care at [facility], [place], [place]

## 2020-02-02 ENCOUNTER — Other Ambulatory Visit: Payer: Self-pay

## 2020-02-02 ENCOUNTER — Encounter (HOSPITAL_COMMUNITY): Payer: Self-pay | Admitting: Pharmacy Technician

## 2020-02-02 ENCOUNTER — Observation Stay (HOSPITAL_COMMUNITY)
Admission: EM | Admit: 2020-02-02 | Discharge: 2020-02-03 | Disposition: A | Discharge status: Home / Self Care | Payer: BLUE CROSS/BLUE SHIELD | Attending: Student in an Organized Health Care Education/Training Program | Admitting: Student in an Organized Health Care Education/Training Program

## 2020-02-02 DIAGNOSIS — D649 Anemia, unspecified: Secondary | ICD-10-CM

## 2020-02-02 DIAGNOSIS — K219 Gastro-esophageal reflux disease without esophagitis: Secondary | ICD-10-CM | POA: Diagnosis present

## 2020-02-02 DIAGNOSIS — E785 Hyperlipidemia, unspecified: Secondary | ICD-10-CM | POA: Diagnosis not present

## 2020-02-02 DIAGNOSIS — Z20822 Contact with and (suspected) exposure to covid-19: Secondary | ICD-10-CM | POA: Insufficient documentation

## 2020-02-02 DIAGNOSIS — Z79899 Other long term (current) drug therapy: Secondary | ICD-10-CM | POA: Diagnosis not present

## 2020-02-02 DIAGNOSIS — E162 Hypoglycemia, unspecified: Secondary | ICD-10-CM

## 2020-02-02 DIAGNOSIS — I10 Essential (primary) hypertension: Secondary | ICD-10-CM | POA: Diagnosis present

## 2020-02-02 DIAGNOSIS — E079 Disorder of thyroid, unspecified: Secondary | ICD-10-CM | POA: Diagnosis not present

## 2020-02-02 DIAGNOSIS — R55 Syncope and collapse: Secondary | ICD-10-CM | POA: Diagnosis not present

## 2020-02-02 DIAGNOSIS — R001 Bradycardia, unspecified: Secondary | ICD-10-CM | POA: Diagnosis not present

## 2020-02-02 DIAGNOSIS — R42 Dizziness and giddiness: Secondary | ICD-10-CM | POA: Diagnosis present

## 2020-02-02 LAB — URINALYSIS, ROUTINE W REFLEX MICROSCOPIC
Bilirubin Urine: NEGATIVE
Glucose, UA: NEGATIVE mg/dL
Hgb urine dipstick: NEGATIVE
Ketones, ur: NEGATIVE mg/dL
Leukocytes,Ua: NEGATIVE
Nitrite: NEGATIVE
Protein, ur: NEGATIVE mg/dL
Specific Gravity, Urine: 1.001 — ABNORMAL LOW (ref 1.005–1.030)
pH: 7 (ref 5.0–8.0)

## 2020-02-02 LAB — LIPID PANEL
Cholesterol: 211 mg/dL — ABNORMAL HIGH (ref 0–200)
HDL: 45 mg/dL (ref >40)
LDL Cholesterol: 111 mg/dL — ABNORMAL HIGH (ref 0–99)
Total CHOL/HDL Ratio: 4.7 RATIO
Triglycerides: 277 mg/dL — ABNORMAL HIGH (ref <150)
VLDL: 55 mg/dL — ABNORMAL HIGH (ref 0–40)

## 2020-02-02 LAB — TROPONIN I (HIGH SENSITIVITY)
Troponin I (High Sensitivity): 3 ng/L (ref <18)
Troponin I (High Sensitivity): 4 ng/L (ref <18)

## 2020-02-02 LAB — BASIC METABOLIC PANEL
Anion gap: 9 (ref 5–15)
BUN: 9 mg/dL (ref 6–20)
CO2: 25 mmol/L (ref 22–32)
Calcium: 9.1 mg/dL (ref 8.9–10.3)
Chloride: 103 mmol/L (ref 98–111)
Creatinine, Ser: 0.71 mg/dL (ref 0.44–1.00)
GFR calc Af Amer: >60 mL/min (ref >60)
GFR calc non Af Amer: >60 mL/min (ref >60)
Glucose, Bld: 91 mg/dL (ref 70–99)
Potassium: 4.3 mmol/L (ref 3.5–5.1)
Sodium: 137 mmol/L (ref 135–145)

## 2020-02-02 LAB — TSH: TSH: 3.036 u[IU]/mL (ref 0.350–4.500)

## 2020-02-02 LAB — CBC
HCT: 41.7 % (ref 36.0–46.0)
Hemoglobin: 13.3 g/dL (ref 12.0–15.0)
MCH: 27.1 pg (ref 26.0–34.0)
MCHC: 31.9 g/dL (ref 30.0–36.0)
MCV: 85.1 fL (ref 80.0–100.0)
Platelets: 303 10*3/uL (ref 150–400)
RBC: 4.9 MIL/uL (ref 3.87–5.11)
RDW: 13.4 % (ref 11.5–15.5)
WBC: 6.6 10*3/uL (ref 4.0–10.5)
nRBC: 0 % (ref 0.0–0.2)

## 2020-02-02 LAB — HEMOGLOBIN A1C
Hgb A1c MFr Bld: 5.7 % — ABNORMAL HIGH (ref 4.8–5.6)
Mean Plasma Glucose: 116.89 mg/dL

## 2020-02-02 LAB — CBG MONITORING, ED
Glucose-Capillary: 63 mg/dL — ABNORMAL LOW (ref 70–99)
Glucose-Capillary: 54 mg/dL — ABNORMAL LOW (ref 70–99)
Glucose-Capillary: 108 mg/dL — ABNORMAL HIGH (ref 70–99)

## 2020-02-02 LAB — HIV ANTIBODY (ROUTINE TESTING W REFLEX): HIV Screen 4th Generation wRfx: NONREACTIVE

## 2020-02-02 MED ORDER — ENOXAPARIN SODIUM 40 MG/0.4ML ~~LOC~~ SOLN: 40.0000 mg | SUBCUTANEOUS | Status: DC

## 2020-02-02 MED ORDER — LISINOPRIL 5 MG PO TABS
5.0000 mg | ORAL_TABLET | Freq: Every day | ORAL | Status: DC
  Administered 2020-02-02: 21:00:00 5 mg via ORAL
  Filled 2020-02-02: qty 1

## 2020-02-02 MED ORDER — SODIUM CHLORIDE 0.9% FLUSH: 3.0000 mL | Freq: Once | INTRAVENOUS | Status: DC

## 2020-02-02 MED ORDER — POLYETHYLENE GLYCOL 3350 17 G PO PACK: 17.0000 g | PACK | Freq: Every day | ORAL | Status: DC | PRN

## 2020-02-02 MED ORDER — SODIUM CHLORIDE 0.9 % IV BOLUS
1000.0000 mL | Freq: Once | INTRAVENOUS | Status: AC
  Administered 2020-02-02: 14:00:00 1000 mL via INTRAVENOUS

## 2020-02-02 MED ORDER — HYDRALAZINE HCL 20 MG/ML IJ SOLN: 5.0000 mg | Freq: Four times a day (QID) | INTRAMUSCULAR | Status: DC | PRN

## 2020-02-02 MED ORDER — GABAPENTIN 300 MG PO CAPS
300.0000 mg | ORAL_CAPSULE | Freq: Every day | ORAL | Status: DC
  Administered 2020-02-02: 300 mg via ORAL
  Filled 2020-02-02: qty 1

## 2020-02-02 MED ORDER — AMLODIPINE BESYLATE 2.5 MG PO TABS
2.5000 mg | ORAL_TABLET | Freq: Every day | ORAL | Status: DC
  Administered 2020-02-02: 21:00:00 2.5 mg via ORAL
  Filled 2020-02-02: qty 1

## 2020-02-02 MED ORDER — PANTOPRAZOLE SODIUM 40 MG PO TBEC
40.0000 mg | DELAYED_RELEASE_TABLET | Freq: Every day | ORAL | Status: DC
  Administered 2020-02-02 – 2020-02-03 (×2): 40 mg via ORAL
  Filled 2020-02-02 (×2): qty 1

## 2020-02-02 NOTE — ED Notes (Signed)
Pt ambulated to bathroom with no complications.

## 2020-02-02 NOTE — ED Notes (Signed)
Pt ambulated to the bathroom again with no complications.

## 2020-02-02 NOTE — ED Provider Notes (Signed)
[facility] EMERGENCY DEPARTMENT Provider Note   CSN: [id number] Arrival date & time: 02/02/20  1137     History Chief Complaint  Patient presents with  . Dizziness  . Bradycardia    Pam Hart is a 59 y.o. female.  HPI    59 year old female comes in a chief complaint of dizziness and bradycardia. Patient reports that she woke up today feeling dizzy.  She had 2 or 3 episodes of near fainting.  She feels little weak.  She noticed that her blood pressure was running slightly low.  She went to an urgent care, and was advised to come to the ER because her heart rate was low.  She has no chest pain, shortness of breath.  Past Medical History:  Diagnosis Date  . Anemia   . Arthritis   . Hypertension   . Thyroid disease     Patient Active Problem List   Diagnosis Date Noted  . Right elbow pain 01/05/2017  . Right hand pain 01/05/2017  . Thyroid nodule 07/29/2016  . Neck pain 10/29/2015  . Hypertension, uncontrolled 08/22/2015  . Chest pain with moderate risk of acute coronary syndrome 08/22/2015  . GERD (gastroesophageal reflux disease) 08/22/2015  . Hyperlipidemia 03/17/2013  . FATIGUE 08/05/2007    Past Surgical History:  Procedure Laterality Date  . APPENDECTOMY    . EYE SURGERY Right 2011   lasix      OB History    Gravida  1   Para      Term      Preterm      AB  1   Living  4     SAB  1   TAB      Ectopic      Multiple      Live Births              Family History  Problem Relation Age of Onset  . Hypertension Mother   . Heart disease Mother   . Alzheimer's disease Father   . Glaucoma Father   . Diabetes Sister   . Hypertension Sister   . Colon cancer Neg Hx   . Colon polyps Neg Hx   . Esophageal cancer Neg Hx   . Kidney disease Neg Hx   . Gallbladder disease Neg Hx     Social History   Tobacco Use  . Smoking status: Never Smoker  . Smokeless tobacco: Never Used  Substance Use Topics  . Alcohol  use: No  . Drug use: No    Home Medications Prior to Admission medications   Medication Sig Start Date End Date Taking? Authorizing Provider  amLODipine (NORVASC) 10 MG tablet TAKE 1/2 TABLET BY MOUTH DAILY 01/12/20   Myles Lipps, MD  gabapentin (NEURONTIN) 300 MG capsule Take 1-2 capsules (300-600 mg total) by mouth at bedtime. 11/23/18   Myles Lipps, MD  lisinopril (PRINIVIL,ZESTRIL) 20 MG tablet TAKE 1 TABLET BY MOUTH EVERY DAY 02/15/19   Myles Lipps, MD  methylPREDNISolone (MEDROL DOSEPAK) 4 MG TBPK tablet Take as directed 12/02/18   Vivi Barrack, DPM  pantoprazole (PROTONIX) 40 MG tablet Take 1 tablet (40 mg total) by mouth daily. 03/02/19   Myles Lipps, MD  traMADol (ULTRAM) 50 MG tablet Take 1 tablet (50 mg total) by mouth every 12 (twelve) hours as needed. 11/23/18   Myles Lipps, MD    Allergies    Patient has no known allergies.  Review of  Systems   Review of Systems  Constitutional: Positive for activity change.  Respiratory: Negative for shortness of breath.   Cardiovascular: Negative for chest pain.  Gastrointestinal: Negative for nausea and vomiting.  Neurological: Positive for dizziness.  All other systems reviewed and are negative.   Physical Exam Updated Vital Signs BP 122/65   Pulse (!) 44   Temp 97.7 F (36.5 C) (Oral)   Resp 15   Ht 5\' 1"  (1.549 m)   Wt 66.7 kg   SpO2 100%   BMI 27.78 kg/m   Physical Exam Vitals and nursing note reviewed.  Constitutional:      Appearance: She is well-developed.  HENT:     Head: Normocephalic and atraumatic.  Cardiovascular:     Rate and Rhythm: Bradycardia present.  Pulmonary:     Effort: Pulmonary effort is normal.  Abdominal:     General: Bowel sounds are normal.  Musculoskeletal:     Cervical back: Normal range of motion and neck supple.  Skin:    General: Skin is warm and dry.  Neurological:     Mental Status: She is alert and oriented to person, place, and time.     ED  Results / Procedures / Treatments   Labs (all labs ordered are listed, but only abnormal results are displayed) Labs Reviewed  URINALYSIS, ROUTINE W REFLEX MICROSCOPIC - Abnormal; Notable for the following components:      Result Value   Color, Urine COLORLESS (*)    Specific Gravity, Urine 1.001 (*)    All other components within normal limits  CBG MONITORING, ED - Abnormal; Notable for the following components:   Glucose-Capillary 63 (*)    All other components within normal limits  BASIC METABOLIC PANEL  CBC  TSH  CBG MONITORING, ED    EKG EKG Interpretation  Date/Time:  Thursday February 02 2020 11:39:41 EDT Ventricular Rate:  50 PR Interval:  142 QRS Duration: 86 QT Interval:  454 QTC Calculation: 413 R Axis:   30 Text Interpretation: Sinus bradycardia Cannot rule out Anterior infarct , age undetermined Abnormal ECG No acute changes No significant change since last tracing Confirmed by 09-05-2006 [phone]) on 02/02/2020 12:48:34 PM   Radiology No results found.  Procedures Procedures (including critical care time)  Medications Ordered in ED Medications  sodium chloride flush (NS) 0.9 % injection 3 mL (has no administration in time range)  sodium chloride 0.9 % bolus 1,000 mL (1,000 mLs Intravenous New Bag/Given 02/02/20 1411)    ED Course  I have reviewed the triage vital signs and the nursing notes.  Pertinent labs & imaging results that were available during my care of the patient were reviewed by me and considered in my medical decision making (see chart for details).    MDM Rules/Calculators/A&P                      Patient comes in a chief complaint of dizziness.  Noted to have heart rate in the 50s.  During our evaluation we checked orthostasis and her heart rate dropped to the 40s.  She had 2 or 3 episodes of near fainting.  She is feeling weak.  I do not have any prior EKG, but it is possible that this bradycardia is contributing.  She does not have any  AV blockage or critical abnormalities on the EKG.  We will get basic labs.  Patient is noted to have CBG of 64.  She is AO x3 and  answering all my questions appropriately, symptoms are because of hypoglycemia.  Dr. Ralene Bathe to f/u  Final Clinical Impression(s) / ED Diagnoses Final diagnoses:  Bradycardia  Near syncope    Rx / DC Orders ED Discharge Orders    None       Varney Biles, MD 02/02/20 [phone]

## 2020-02-02 NOTE — H&P (Signed)
Date: 02/02/2020               Patient Name:  Pam Hart MRN: [id number]  DOB: [birth year] Age / Sex: 59 y.o., female   PCP: Myles Lipps, MD         Medical Service: Internal Medicine Teaching Service         Attending Physician: Dr. Oswaldo Done     First Contact: Marchia Bond DO, Sharlet Salina Pager: [facility] [phone])  Second Contact: Dortha Schwalbe, MD, Obed Pager: OA [phone])       After Hours (After 5p/  First Contact Pager: [phone]  weekends / holidays): Second Contact Pager: [phone]   Chief Complaint: Pre-syncope  History of Present Illness: Pam Hart is a 59 y.o. female with a pertinent past medical history of anemia, hypertension, thyroid disease, GERD, HLD who presented acute onset dizziness. She states that she has had roughly 2-3 presyncopal episodes. She reports that's she she woke up this morning feeling dizzy, tired and feeling of passing out when she walked to the bathroom. She checked her blood pressure at home and initially in the 110s/50s, 90s/50s. She reports that usually her blood pressures are high and this was the first time that she had low BP readings. She states that there have been times where she has felt "tightness in her chest" but thinks it is due to the fact that she is an anxious person and is also worried about the coronavirus. She denies fevers, chills, cough, nausea. She does reports of right sided abdominal pain and is worried about the possibility of cholecystitis.   Currently she states that she feels better but just a little tired, she uses reading glasses and denies any changes with her vision. She also endorses epigastric pain that is not worsened with any specific activity. She denies nausea, vomiting.   ED Course: Patient found to be bradycardic in the 40s with intermittent symptoms. Cardiology was consulted believes she may need a cardiac work-up.    Meds:  Current Meds  Medication Sig  . amLODipine (NORVASC) 10 MG tablet TAKE 1/2 TABLET BY MOUTH  DAILY (Patient taking differently: Take 10 mg by mouth daily. )  . gabapentin (NEURONTIN) 300 MG capsule Take 1-2 capsules (300-600 mg total) by mouth at bedtime. (Patient taking differently: Take 300-600 mg by mouth daily as needed (For nerve pain). )  . lisinopril (PRINIVIL,ZESTRIL) 20 MG tablet TAKE 1 TABLET BY MOUTH EVERY DAY  . meloxicam (MOBIC) 15 MG tablet Take 15 mg by mouth daily.  . pantoprazole (PROTONIX) 40 MG tablet Take 1 tablet (40 mg total) by mouth daily.  Marland Kitchen tiZANidine (ZANAFLEX) 4 MG tablet Take 4 mg by mouth every 6 (six) hours as needed for muscle spasms.  . traMADol (ULTRAM) 50 MG tablet Take 1 tablet (50 mg total) by mouth every 12 (twelve) hours as needed.    Social:  Social History   Socioeconomic History  . Marital status: Married    Spouse name: Not on file  . Number of children: 4  . Years of education: Not on file  . Highest education level: Not on file  Occupational History  . Occupation: Interpreter  Tobacco Use  . Smoking status: Never Smoker  . Smokeless tobacco: Never Used  Substance and Sexual Activity  . Alcohol use: No  . Drug use: No  . Sexual activity: Yes    Partners: Male    Birth control/protection: Post-menopausal  Other Topics Concern  . Not on file  Social  History Narrative  . Not on file   Social Determinants of Health   Financial Resource Strain:   . Difficulty of Paying Living Expenses:   Food Insecurity:   . Worried About Programme researcher, broadcasting/film/video in the Last Year:   . Barista in the Last Year:   Transportation Needs:   . Freight forwarder (Medical):   Marland Kitchen Lack of Transportation (Non-Medical):   Physical Activity:   . Days of Exercise per Week:   . Minutes of Exercise per Session:   Stress:   . Feeling of Stress :   Social Connections:   . Frequency of Communication with Friends and Family:   . Frequency of Social Gatherings with Friends and Family:   . Attends Religious Services:   . Active Member of Clubs or  Organizations:   . Attends Banker Meetings:   Marland Kitchen Marital Status:   Intimate Partner Violence:   . Fear of Current or Ex-Partner:   . Emotionally Abused:   Marland Kitchen Physically Abused:   . Sexually Abused:      Family History:  Family History  Problem Relation Age of Onset  . Hypertension Mother   . Heart disease Mother   . Alzheimer's disease Father   . Glaucoma Father   . Diabetes Sister   . Hypertension Sister   . Colon cancer Neg Hx   . Colon polyps Neg Hx   . Esophageal cancer Neg Hx   . Kidney disease Neg Hx   . Gallbladder disease Neg Hx      Allergies: Allergies as of 02/02/2020  . (No Known Allergies)   Past Medical History:  Diagnosis Date  . Anemia   . Arthritis   . Hypertension   . Thyroid disease      Review of Systems: A complete ROS was negative except as per HPI.   Physical Exam: Blood pressure (!) 154/67, pulse 65, temperature 97.7 F (36.5 C), temperature source Oral, resp. rate 16, height 5\' 1"  (1.549 m), weight 66.7 kg, SpO2 100 %. Physical Exam  Constitutional: She is oriented to person, place, and time and well-developed, well-nourished, and in no distress.  HENT:  Head: Normocephalic and atraumatic.  Eyes: EOM are normal.  Cardiovascular: Normal rate and intact distal pulses. Exam reveals no gallop and no friction rub.  No murmur heard. Pulmonary/Chest: Effort normal and breath sounds normal. No respiratory distress. She has no wheezes.  Abdominal: Soft. Bowel sounds are normal. She exhibits no distension. There is no abdominal tenderness.  Musculoskeletal:        General: No tenderness or edema. Normal range of motion.     Cervical back: Normal range of motion.  Neurological: She is alert and oriented to person, place, and time.  Skin: Skin is warm and dry.    Labs: CBC    Component Value Date/Time   WBC 6.6 02/02/2020 1156   RBC 4.90 02/02/2020 1156   HGB 13.3 02/02/2020 1156   HGB 13.5 09/21/2018 1526   HCT 41.7  02/02/2020 1156   HCT 40.0 09/21/2018 1526   PLT 303 02/02/2020 1156   PLT 324 09/21/2018 1526   MCV 85.1 02/02/2020 1156   MCV 82 09/21/2018 1526   MCH 27.1 02/02/2020 1156   MCHC 31.9 02/02/2020 1156   RDW 13.4 02/02/2020 1156   RDW 13.8 09/21/2018 1526   LYMPHSABS 2.6 09/21/2018 1526   MONOABS 462 08/07/2016 1640   EOSABS 0.1 09/21/2018 1526   BASOSABS 0.0  09/21/2018 1526     CMP     Component Value Date/Time   NA 137 02/02/2020 1156   NA 142 09/21/2018 1526   K 4.3 02/02/2020 1156   CL 103 02/02/2020 1156   CO2 25 02/02/2020 1156   GLUCOSE 91 02/02/2020 1156   BUN 9 02/02/2020 1156   BUN 9 09/21/2018 1526   CREATININE 0.71 02/02/2020 1156   CREATININE 0.74 08/07/2016 1640   CALCIUM 9.1 02/02/2020 1156   PROT 6.9 09/21/2018 1526   ALBUMIN 4.3 09/21/2018 1526   AST 14 09/21/2018 1526   ALT 15 09/21/2018 1526   ALKPHOS 70 09/21/2018 1526   BILITOT <0.2 09/21/2018 1526   GFRNONAA >60 02/02/2020 1156   GFRNONAA >89 08/07/2016 1640   GFRAA >60 02/02/2020 1156   GFRAA >89 08/07/2016 1640    Lab Pending:     Basic metabolic panel     CBC     Urinalysis, Routine w reflex microscopic     TSH     HIV Antibody (routine testing w rflx)     Hemoglobin A1c     Basic metabolic panel     CBC     Lipid panel     CBG monitoring, ED     POC CBG, ED     CBG monitoring, ED   Imaging: No results found.  EKG: personally reviewed my interpretation is sinus bradycardia with questionable ST elevations in leads I, and II without reciprocal or continguous ischemic changes.   Assessment & Plan by Problem: Principal Problem:   Pre-syncope Active Problems:   Hyperlipidemia   Hypertension, uncontrolled   GERD (gastroesophageal reflux disease)   Ximenna Jerilynn Mages Devers is a 59 y.o. with pertinent PMH of hypertension, hyperlipidemia, GERD, small thyroid nodules who presented with dizziness found to be bradycardic to 49 on arrival on hospital day 0   #Transient  bradycardia #Dizziness She presents with a 1 day history of dizziness with associated 2-3 presyncopal episodes which seem to have occurred while she was in the bathroom.  She does not report of ongoing chest pain but does report of prior history of chest pressure with no associated shortness of breath, nausea, vomiting, diaphoresis which she attributes to anxiety.  Per chart review, she was admitted in 2016 with similar symptoms and underwent a Myoview scan which was unremarkable as it revealed a low risk study.  Ejection fraction at that point was greater than 65% with no obvious wall motion abnormalities.  On arrival, she was found to be bradycardic with HR 49 (range 43-73).  Differential diagnosis for her dizziness includes cardiogenic versus neurogenic versus orthostatic.  She is at risk for ACS given her history of hypertension, prior hyperlipidemia. -Follow-up troponin, echocardiogram, orthostatic vitals, TSH -Continue cardiac monitoring - Ambulation with assistance, fall risk. - Lipid panel pending    #Hypertension She states that previously her BPs have been uncontrolled.  This morning she had episodes of hypotension with BPs in the 90s and 110s.  Currently BP is in the 150s/60s -Slowly resume home antihypertensives (amlodipine 2.5 mg, lisinopril 5 mg)   #Hypoglycemia Found to have a CBG of 63 with repeat of 54.  Currently CBG is 108.  She denies any prior history of diabetes or ingestion insulin.  Could be secondary to decreased p.o. intake since this a.m. -Continue to monitor -If hypoglycemia persists, would order proinsulin level, C-peptide level   Diet: Heart Healthy VTE: Enoxaparin IVF: None,None Code: Full  Prior to Admission Living Arrangement: Home, living  husband Anticipated Discharge Location: Home Barriers to Discharge: further medical work up.  Dispo: Admit patient to Observation with expected length of stay less than 2 midnights.  Signed: Dellia Cloud, D.O. Date  02/02/2020 Time 7:11 PM [facility] Internal Medicine, PGY-1 Pager: [phone]

## 2020-02-02 NOTE — ED Notes (Signed)
Pt provided sandwich , crackers, apple juice, Pt cbg 54- alert and oriented- able to tolerate PO- will continue to monitor,.

## 2020-02-02 NOTE — ED Triage Notes (Signed)
Pt arrives via pov from UC for further eval of symptomatic bradycardia. Pt reports she woke up today feeling dizzy. States she noticed her BP was low and her pulse was low. HR 49 in triage.

## 2020-02-03 ENCOUNTER — Observation Stay (HOSPITAL_BASED_OUTPATIENT_CLINIC_OR_DEPARTMENT_OTHER): Payer: BLUE CROSS/BLUE SHIELD

## 2020-02-03 DIAGNOSIS — I361 Nonrheumatic tricuspid (valve) insufficiency: Secondary | ICD-10-CM

## 2020-02-03 DIAGNOSIS — R001 Bradycardia, unspecified: Secondary | ICD-10-CM | POA: Diagnosis not present

## 2020-02-03 DIAGNOSIS — E079 Disorder of thyroid, unspecified: Secondary | ICD-10-CM | POA: Diagnosis not present

## 2020-02-03 DIAGNOSIS — E162 Hypoglycemia, unspecified: Secondary | ICD-10-CM | POA: Diagnosis not present

## 2020-02-03 DIAGNOSIS — E041 Nontoxic single thyroid nodule: Secondary | ICD-10-CM

## 2020-02-03 DIAGNOSIS — R55 Syncope and collapse: Secondary | ICD-10-CM | POA: Diagnosis not present

## 2020-02-03 DIAGNOSIS — I10 Essential (primary) hypertension: Secondary | ICD-10-CM | POA: Diagnosis not present

## 2020-02-03 LAB — BASIC METABOLIC PANEL
Anion gap: 8 (ref 5–15)
BUN: 7 mg/dL (ref 6–20)
CO2: 25 mmol/L (ref 22–32)
Calcium: 8.8 mg/dL — ABNORMAL LOW (ref 8.9–10.3)
Chloride: 110 mmol/L (ref 98–111)
Creatinine, Ser: 0.7 mg/dL (ref 0.44–1.00)
GFR calc Af Amer: >60 mL/min (ref >60)
GFR calc non Af Amer: >60 mL/min (ref >60)
Glucose, Bld: 131 mg/dL — ABNORMAL HIGH (ref 70–99)
Potassium: 3.7 mmol/L (ref 3.5–5.1)
Sodium: 143 mmol/L (ref 135–145)

## 2020-02-03 LAB — CBC
HCT: 38.2 % (ref 36.0–46.0)
Hemoglobin: 12.6 g/dL (ref 12.0–15.0)
MCH: 27.2 pg (ref 26.0–34.0)
MCHC: 33 g/dL (ref 30.0–36.0)
MCV: 82.5 fL (ref 80.0–100.0)
Platelets: 297 10*3/uL (ref 150–400)
RBC: 4.63 MIL/uL (ref 3.87–5.11)
RDW: 13.4 % (ref 11.5–15.5)
WBC: 8.1 10*3/uL (ref 4.0–10.5)
nRBC: 0 % (ref 0.0–0.2)

## 2020-02-03 LAB — ECHOCARDIOGRAM COMPLETE
Height: 61 in
Weight: 2380.8 oz

## 2020-02-03 LAB — SARS CORONAVIRUS 2 (TAT 6-24 HRS): SARS Coronavirus 2: NEGATIVE

## 2020-02-03 MED ORDER — ACETAMINOPHEN 325 MG PO TABS
650.0000 mg | ORAL_TABLET | Freq: Four times a day (QID) | ORAL | Status: DC | PRN
  Administered 2020-02-03: 650 mg via ORAL
  Filled 2020-02-03: qty 2

## 2020-02-03 NOTE — Progress Notes (Signed)
Subjective: HD#0 Events Overnight: no events overnight  Patient was seen this morning on rounds. Ms. Pam Hart states she feels tired today. She has been able to walk around without difficulty. She also reports a pain in her left side but states that she has been having that pain before. She denies any abdominal pain.   Ms. Pam Hart states yesterday's dizziness after using the restroom was the first time it ever happened. She is concerned it is due to her heart rate or low glucose.   Emphasized importance of caution when using the restroom or getting up in the AM to avoid future episodes. Discussed discontinuing Amlodipine   Objective:  Vital signs in last 24 hours: Vitals:   02/02/20 1959 02/02/20 2010 02/03/20 0010 02/03/20 0335  BP:  (!) 134/59 (!) 144/68 (!) 116/54  Pulse:  (!) 59 61 65  Resp:  18 20 19   Temp:  98.3 F (36.8 C) 98.3 F (36.8 C) 98.1 F (36.7 C)  TempSrc:  Oral Oral Oral  SpO2:  100% 100% 97%  Weight: 67.5 kg     Height: 5\' 1"  (1.549 m)      Supplemental O2: Room air  Physical Exam: Physical Exam  Constitutional: She is oriented to person, place, and time and well-developed, well-nourished, and in no distress.  HENT:  Head: Normocephalic and atraumatic.  Eyes: EOM are normal.  Cardiovascular: Normal rate and intact distal pulses.  Pulmonary/Chest: Effort normal and breath sounds normal.  Abdominal: Soft. She exhibits no distension. There is no abdominal tenderness.  Musculoskeletal:        General: No tenderness or edema. Normal range of motion.     Cervical back: Normal range of motion.  Neurological: She is oriented to person, place, and time.  Skin: Skin is warm and dry.    Filed Weights   02/02/20 1146 02/02/20 1959  Weight: 66.7 kg 67.5 kg     Intake/Output Summary (Last 24 hours) at 02/03/2020 0745 Last data filed at 02/03/2020 0100 Gross per 24 hour  Intake 1240 ml  Output 600 ml  Net 640 ml    Risk Score:  N/A  Pertinent  labs/Imaging: CBC Latest Ref Rng & Units 02/03/2020 02/02/2020 09/21/2018  WBC 4.0 - 10.5 K/uL 8.1 6.6 6.8  Hemoglobin 12.0 - 15.0 g/dL 02/04/2020 13/03/2018 74.2  Hematocrit 36.0 - 46.0 % 38.2 41.7 40.0  Platelets 150 - 400 K/uL 297 303 324    CMP Latest Ref Rng & Units 02/03/2020 02/02/2020 09/21/2018  Glucose 70 - 99 mg/dL 02/04/2020) 91 93  BUN 6 - 20 mg/dL 7 9 9   Creatinine 0.44 - 1.00 mg/dL 13/03/2018 756(E )  Sodium 135 - 145 mmol/L 143 137 142  Potassium 3.5 - 5.1 mmol/L 3.7 4.3 4.1  Chloride 98 - 111 mmol/L 110 103 101  CO2 22 - 32 mmol/L 25 25 26   Calcium 8.9 - 10.3 mg/dL 3.32) 9.1 9.5  Total Protein 6.0 - 8.5 g/dL - - 6.9  Total Bilirubin 0.0 - 1.2 mg/dL - - 9.51  Alkaline Phos 39 - 117 IU/L - - 70  AST 0 - 40 IU/L - - 14  ALT 0 - 32 IU/L - - 15   Hgb A1C: 5.7 TSH: 3.036 Troponin: 4>3  Lipid Panel     Component Value Date/Time   CHOL 211 (H) 02/02/2020 1833   CHOL 212 (H) 09/30/2017 1654   TRIG 277 (H) 02/02/2020 1833   HDL 45 02/02/2020 1833   HDL 49 09/30/2017 1654  CHOLHDL 4.7 02/02/2020 1833   VLDL 55 (H) 02/02/2020 1833   LDLCALC 111 (H) 02/02/2020 1833   LDLCALC 95 09/30/2017 1654   LABVLDL 68 (H) 09/30/2017 1654    Echocardiogram: 1. Left ventricular ejection fraction, by estimation, is 60 to 65%. The  left ventricle has normal function. The left ventricle has no regional  wall motion abnormalities. Left ventricular diastolic parameters were  normal.  2. Right ventricular systolic function is normal. The right ventricular  size is normal. There is normal pulmonary artery systolic pressure. The  estimated right ventricular systolic pressure is 46.5 mmHg.  3. The mitral valve is normal in structure. Trivial mitral valve  regurgitation. No evidence of mitral stenosis.  4. The aortic valve is tricuspid. Aortic valve regurgitation is not  visualized. No aortic stenosis is present.  5. The inferior vena cava is normal in size with greater than 50%  respiratory  variability, suggesting right atrial pressure of 3 mmHg.  Assessment/Plan:  Principal Problem:   Pre-syncope Active Problems:   Hyperlipidemia   Hypertension, uncontrolled   GERD (gastroesophageal reflux disease)   Patient Summary: Pam Hart is a 59 y.o. with pertinent PMH of hypertension, hyperlipidemia, GERD, small thyroid nodules  who presented with dizziness and admit for bradycardic to 49 on hospital day 0  #Pre-Syncope: #Dizziness: Patient states that her dizziness has resolved since being admitted.  She states that she still continues to have some weakness but has been able to get up and walk without recurrence of her symptoms. She is stable for discharge with close follow up with her out patient physician for medication management. - No electrolyte abnormalities - Patient did have some transient hypotension and bradycardia - stopped antihypertensive medications - She was given 1.2 L of IV fluids with negative subsequent orthostatic vitals.  - Echo WNL  #Hypertension We will start back her lisinopril today but continue to hold her amlodipine.  She'll need follow-up with her primary care doctor to restart amlodipine with her symptoms fully resolve.  #Hypoglycemia Patient's blood sugar has remained normal since admission.  She denies any signs or symptoms of illness or poor p.o. intake leading up to her hospitalization.  She'll likely need close outpatient follow-up to rule out subsequent episodes of hypoglycemia.  Diet: Heart Healthy IVF: None,None VTE: Enoxaparin Code: Full PT/OT recs: None TOC recs: None   Dispo: Anticipated discharge today.    Marianna Payment, D.O. [facility], PGY-1 Date 02/03/2020 Time 7:45 AM

## 2020-02-03 NOTE — Progress Notes (Signed)
  Echocardiogram 2D Echocardiogram has been performed.  Tye Savoy 02/03/2020, 8:36 AM

## 2020-02-07 NOTE — Discharge Summary (Addendum)
Name: Pam Hart MRN: [id number] DOB: Jan 07, 1961 59 y.o. PCP: Rutherford Guys, MD  Date of Admission: 02/02/2020 11:39 AM Date of Discharge: 02/03/2020 Attending Physician: Lalla Brothers, MD  Discharge Diagnosis: 1. Situational Syncope after urination 2. Sinus bradycardia due in part to amlodipine  Discharge Medications: Allergies as of 02/03/2020   No Known Allergies      Medication List     STOP taking these medications    amLODipine 10 MG tablet Commonly known as: NORVASC   methylPREDNISolone 4 MG Tbpk tablet Commonly known as: MEDROL DOSEPAK       TAKE these medications    gabapentin 300 MG capsule Commonly known as: NEURONTIN Take 1-2 capsules (300-600 mg total) by mouth at bedtime. What changed:  when to take this reasons to take this   lisinopril 20 MG tablet Commonly known as: ZESTRIL TAKE 1 TABLET BY MOUTH EVERY DAY   meloxicam 15 MG tablet Commonly known as: MOBIC Take 15 mg by mouth daily.   pantoprazole 40 MG tablet Commonly known as: PROTONIX Take 1 tablet (40 mg total) by mouth daily.   tiZANidine 4 MG tablet Commonly known as: ZANAFLEX Take 4 mg by mouth every 6 (six) hours as needed for muscle spasms.   traMADol 50 MG tablet Commonly known as: ULTRAM Take 1 tablet (50 mg total) by mouth every 12 (twelve) hours as needed.        Disposition and follow-up:   Ms.Pam Hart was discharged from [facility] in Good condition.  At the hospital follow up visit please address:  1.  Follow-up: 1) HTN - antihypertensive medications   2.  Labs / imaging needed at time of follow-up: none  3.  Pending labs/ test needing follow-up: none  Follow-up Appointments:   Patient was instructed to follow-up with her PCP within 1 week.  Hospital Course by problem list: 1.  Situational syncope -[facility] on [facility]18 with acute onset dizziness with roughly 2-3 presyncopal episodes that she  describes as feeling dizzy, tired, and feeling like she is going to pass out.  His episodes occurred after using the bathroom and standing up while washing her hands.  She states that she went to go take her blood pressure and it was 110s/50s and then 90s/50s on repeat.  She states that she began to feel some chest tightness and presents to the ED due to concern for the symptoms.  On presentation, the patient's symptoms had all resolved.  Her orthostatic vitals were within normal limits and troponins were flat.  She denied persistent abdominal pain at that time.  Echocardiogram was performed to rule out structural heart disease and it was within normal limits.  Patient was placed on cardiac monitoring throughout her hospitalization which showed persistent heart rate around 60 without significant bradycardia.  Patient was instructed to discontinue amlodipine as it may be contributing to her dizziness and/or bradycardia.  She was also counseled on the importance of using lifestyle modifications to combat her situational syncope.  Patient was discharged and instructed to follow-up with her outpatient PCP for further evaluation and management.  Discharge Vitals:   BP 136/62 (BP Location: Right Arm)   Pulse 60   Temp 98.2 F (36.8 C) (Oral)   Resp 19   Ht 5\' 1"  (1.549 m)   Wt 67.5 kg   SpO2 100%   BMI 28.12 kg/m   Pertinent Labs, Studies, and Procedures:  CBC Latest Ref Rng &  Units 02/03/2020 02/02/2020 09/21/2018  WBC 4.0 - 10.5 K/uL 8.1 6.6 6.8  Hemoglobin 12.0 - 15.0 g/dL 67.1 24.5 80.9  Hematocrit 36.0 - 46.0 % 38.2 41.7 40.0  Platelets 150 - 400 K/uL 297 303 324   CMP Latest Ref Rng & Units 02/03/2020 02/02/2020 09/21/2018  Glucose 70 - 99 mg/dL 983(J) 91 93  BUN 6 - 20 mg/dL 7 9 9   Creatinine 0.44 - 1.00 mg/dL 8.25 0.53)  Sodium 135 - 145 mmol/L 143 137 142  Potassium 3.5 - 5.1 mmol/L 3.7 4.3 4.1  Chloride 98 - 111 mmol/L 110 103 101  CO2 22 - 32 mmol/L 25 25 26   Calcium 8.9 - 10.3  mg/dL 9.76(B) 9.1 9.5  Total Protein 6.0 - 8.5 g/dL - - 6.9  Total Bilirubin 0.0 - 1.2 mg/dL - -  Alkaline Phos 39 - 117 IU/L - - 70  AST 0 - 40 IU/L - - 14  ALT 0 - 32 IU/L - - 15   Echocardiogram: 1. Left ventricular ejection fraction, by estimation, is 60 to 65%. The  left ventricle has normal function. The left ventricle has no regional  wall motion abnormalities. Left ventricular diastolic parameters were  normal.   2. Right ventricular systolic function is normal. The right ventricular  size is normal. There is normal pulmonary artery systolic pressure. The  estimated right ventricular systolic pressure is 32.2 mmHg.   3. The mitral valve is normal in structure. Trivial mitral valve  regurgitation. No evidence of mitral stenosis.   4. The aortic valve is tricuspid. Aortic valve regurgitation is not  visualized. No aortic stenosis is present.   5. The inferior vena cava is normal in size with greater than 50%  respiratory variability, suggesting right atrial pressure of 3 mmHg.  Discharge Instructions: Discharge Instructions     Diet - low sodium heart healthy   Complete by: As directed    Discharge instructions   Complete by: As directed    You were hospitalized for pre-syncope. Thank you for allowing 3.4(L to be part of your care.   Please arrange follow up with your primary care provider within the next week.    Please note these changes made to your medications:   Please START taking: Please restart home medications listed on your discharge summary    Please STOP taking:  - Please stop taking amlodipine, wait to restart this medication once you have followed up with your primary care physician.   Please make sure to make a follow up appointment with your out patient doctor.  Please call our clinic if you have any questions or concerns, we may be able to help and keep you from a long and expensive emergency room wait. Our clinic and after hours phone number is  [phone], the best time to call is Monday through Friday 9 am to 4 pm but there is always someone available 24/7 if you have an emergency. If you need medication refills please notify your pharmacy one week in advance and they will send Wednesday a request.   Increase activity slowly   Complete by: As directed        Signed: Sunday, MD 02/07/2020, 7:42 AM   Pager: [phone]

## 2020-02-08 ENCOUNTER — Encounter: Payer: Self-pay | Admitting: Certified Nurse Midwife

## 2020-06-29 ENCOUNTER — Other Ambulatory Visit: Payer: Self-pay | Admitting: Family Medicine

## 2020-06-29 NOTE — Telephone Encounter (Signed)
Requested medication (s) are due for refill today: yes  Requested medication (s) are on the active medication list: yes  Last refill:  03/02/19 #30  Future visit scheduled: no  Notes to clinic:  Please review for refill. Last OV over a year ago    Requested Prescriptions  Pending Prescriptions Disp Refills   pantoprazole (PROTONIX) 40 MG tablet [Pharmacy Med Name: PANTOPRAZOLE SOD DR 40 MG TAB] 90 tablet 1    Sig: TAKE 1 TABLET BY MOUTH EVERY DAY      Gastroenterology: Proton Pump Inhibitors Failed - 06/29/2020  4:56 PM      Failed - Valid encounter within last 12 months    Recent Outpatient Visits           1 year ago Plantar fasciitis of left foot   [facility], Meda Coffee, MD   1 year ago Arthralgia, unspecified joint   [facility], Meda Coffee, MD   2 years ago Pain of right great toe   [facility] at [facility], Marolyn Hammock, PA-C   2 years ago Pain of left heel   Primary Care at [facility], [place], [place]   2 years ago Influenza-like illness   Primary Care at [facility], [place], [place]

## 2020-12-24 ENCOUNTER — Encounter: Payer: Self-pay | Admitting: Obstetrics & Gynecology

## 2020-12-24 ENCOUNTER — Other Ambulatory Visit: Payer: Self-pay

## 2020-12-24 ENCOUNTER — Other Ambulatory Visit (HOSPITAL_COMMUNITY)
Admission: RE | Admit: 2020-12-24 | Discharge: 2020-12-24 | Disposition: A | Discharge status: Home / Self Care | Payer: 59 | Source: Ambulatory Visit | Attending: Obstetrics & Gynecology | Admitting: Obstetrics & Gynecology

## 2020-12-24 ENCOUNTER — Ambulatory Visit (INDEPENDENT_AMBULATORY_CARE_PROVIDER_SITE_OTHER): Payer: Self-pay | Admitting: Obstetrics & Gynecology

## 2020-12-24 VITALS — BP 178/81 | HR 72 | Ht 62.0 in | Wt 150.3 lb

## 2020-12-24 DIAGNOSIS — Z01419 Encounter for gynecological examination (general) (routine) without abnormal findings: Secondary | ICD-10-CM | POA: Diagnosis present

## 2020-12-24 DIAGNOSIS — Z1239 Encounter for other screening for malignant neoplasm of breast: Secondary | ICD-10-CM

## 2020-12-24 NOTE — Patient Instructions (Signed)
Preventive Care 84-61 Years Old, Female Preventive care refers to lifestyle choices and visits with your health care provider that can promote health and wellness. This includes:  A yearly physical exam. This is also called an annual wellness visit.  Regular dental and eye exams.  Immunizations.  Screening for certain conditions.  Healthy lifestyle choices, such as: ? Eating a healthy diet. ? Getting regular exercise. ? Not using drugs or products that contain nicotine and tobacco. ? Limiting alcohol use. What can I expect for my preventive care visit? Physical exam Your health care provider will check your:  Height and weight. These may be used to calculate your BMI (body mass index). BMI is a measurement that tells if you are at a healthy weight.  Heart rate and blood pressure.  Body temperature.  Skin for abnormal spots. Counseling Your health care provider may ask you questions about your:  Past medical problems.  Family's medical history.  Alcohol, tobacco, and drug use.  Emotional well-being.  Home life and relationship well-being.  Sexual activity.  Diet, exercise, and sleep habits.  Work and work Statistician.  Access to firearms.  Method of birth control.  Menstrual cycle.  Pregnancy history. What immunizations do I need? Vaccines are usually given at various ages, according to a schedule. Your health care provider will recommend vaccines for you based on your age, medical history, and lifestyle or other factors, such as travel or where you work.   What tests do I need? Blood tests  Lipid and cholesterol levels. These may be checked every 5 years, or more often if you are over 3 years old.  Hepatitis C test.  Hepatitis B test. Screening  Lung cancer screening. You may have this screening every year starting at age 73 if you have a 30-pack-year history of smoking and currently smoke or have quit within the past 15 years.  Colorectal cancer  screening. ? All adults should have this screening starting at age 52 and continuing until age 17. ? Your health care provider may recommend screening at age 49 if you are at increased risk. ? You will have tests every 1-10 years, depending on your results and the type of screening test.  Diabetes screening. ? This is done by checking your blood sugar (glucose) after you have not eaten for a while (fasting). ? You may have this done every 1-3 years.  Mammogram. ? This may be done every 1-2 years. ? Talk with your health care provider about when you should start having regular mammograms. This may depend on whether you have a family history of breast cancer.  BRCA-related cancer screening. This may be done if you have a family history of breast, ovarian, tubal, or peritoneal cancers.  Pelvic exam and Pap test. ? This may be done every 3 years starting at age 10. ? Starting at age 11, this may be done every 5 years if you have a Pap test in combination with an HPV test. Other tests  STD (sexually transmitted disease) testing, if you are at risk.  Bone density scan. This is done to screen for osteoporosis. You may have this scan if you are at high risk for osteoporosis. Talk with your health care provider about your test results, treatment options, and if necessary, the need for more tests. Follow these instructions at home: Eating and drinking  Eat a diet that includes fresh fruits and vegetables, whole grains, lean protein, and low-fat dairy products.  Take vitamin and mineral supplements  as recommended by your health care provider.  Do not drink alcohol if: ? Your health care provider tells you not to drink. ? You are pregnant, may be pregnant, or are planning to become pregnant.  If you drink alcohol: ? Limit how much you have to 0-1 drink a day. ? Be aware of how much alcohol is in your drink. In the [place] one drink equals one 12 oz bottle of beer (355 mL), one 5 oz glass of  wine (148 mL), or one 1 oz glass of hard liquor (44 mL).   Lifestyle  Take daily care of your teeth and gums. Brush your teeth every morning and night with fluoride toothpaste. Floss one time each day.  Stay active. Exercise for at least 30 minutes 5 or more days each week.  Do not use any products that contain nicotine or tobacco, such as cigarettes, e-cigarettes, and chewing tobacco. If you need help quitting, ask your health care provider.  Do not use drugs.  If you are sexually active, practice safe sex. Use a condom or other form of protection to prevent STIs (sexually transmitted infections).  If you do not wish to become pregnant, use a form of birth control. If you plan to become pregnant, see your health care provider for a prepregnancy visit.  If told by your health care provider, take low-dose aspirin daily starting at age 50.  Find healthy ways to cope with stress, such as: ? Meditation, yoga, or listening to music. ? Journaling. ? Talking to a trusted person. ? Spending time with friends and family. Safety  Always wear your seat belt while driving or riding in a vehicle.  Do not drive: ? If you have been drinking alcohol. Do not ride with someone who has been drinking. ? When you are tired or distracted. ? While texting.  Wear a helmet and other protective equipment during sports activities.  If you have firearms in your house, make sure you follow all gun safety procedures. What's next?  Visit your health care provider once a year for an annual wellness visit.  Ask your health care provider how often you should have your eyes and teeth checked.  Stay up to date on all vaccines. This information is not intended to replace advice given to you by your health care provider. Make sure you discuss any questions you have with your health care provider. Document Revised: 08/07/2020 Document Reviewed: 07/15/2018 Elsevier Patient Education  2021 Elsevier Inc.  

## 2020-12-24 NOTE — Progress Notes (Signed)
GYNECOLOGY ANNUAL PREVENTATIVE CARE ENCOUNTER NOTE  History:     Pam Hart is a 60 y.o. [id number] female here for a routine annual gynecologic exam and to establish care.  Current complaints: none.   Denies abnormal vaginal bleeding, discharge, pelvic pain, problems with intercourse or other gynecologic concerns.    Gynecologic History No LMP recorded. Patient is postmenopausal. Last Pap: 2018. Results were: normal with negative HPV Last mammogram: 2-3 years ago. Results were: normal  Obstetric History OB History  Gravida Para Term Preterm AB Living  5       1 3   SAB IAB Ectopic Multiple Live Births  1       4    # Outcome Date GA Lbr Len/2nd Weight Sex Delivery Anes PTL Lv  5 Gravida           4 Gravida           3 Gravida           2 Gravida           1 SAB             Past Medical History:  Diagnosis Date  . Anemia   . Arthritis   . Hypertension   . Thyroid disease     Past Surgical History:  Procedure Laterality Date  . APPENDECTOMY    . EYE SURGERY Right 2011   lasix     Current Outpatient Medications on File Prior to Visit  Medication Sig Dispense Refill  . gabapentin (NEURONTIN) 300 MG capsule Take 1-2 capsules (300-600 mg total) by mouth at bedtime. (Patient taking differently: Take 300-600 mg by mouth daily as needed (For nerve pain).) 60 capsule 3  . lisinopril (PRINIVIL,ZESTRIL) 20 MG tablet TAKE 1 TABLET BY MOUTH EVERY DAY 30 tablet 0  . meloxicam (MOBIC) 15 MG tablet Take 15 mg by mouth daily.    . pantoprazole (PROTONIX) 40 MG tablet Take 1 tablet (40 mg total) by mouth daily. 30 tablet 0  . tiZANidine (ZANAFLEX) 4 MG tablet Take 4 mg by mouth every 6 (six) hours as needed for muscle spasms.    . traMADol (ULTRAM) 50 MG tablet Take 1 tablet (50 mg total) by mouth every 12 (twelve) hours as needed. (Patient not taking: Reported on 12/24/2020) 30 tablet 0   No current facility-administered medications on file prior to visit.    No Known  Allergies  Social History:  reports that she has never smoked. She has never used smokeless tobacco. She reports that she does not drink alcohol and does not use drugs.  Family History  Problem Relation Age of Onset  . Hypertension Mother   . Heart disease Mother   . Other Mother        blood clot  . Alzheimer's disease Father   . Glaucoma Father   . Diabetes Sister   . Hypertension Sister   . Colon cancer Neg Hx   . Colon polyps Neg Hx   . Esophageal cancer Neg Hx   . Kidney disease Neg Hx   . Gallbladder disease Neg Hx     The following portions of the patient's history were reviewed and updated as appropriate: allergies, current medications, past family history, past medical history, past social history, past surgical history and problem list.  Review of Systems Pertinent items noted in HPI and remainder of comprehensive ROS otherwise negative.  Physical Exam:  BP (!) 178/81 (BP Location: Right Arm)   Pulse  72   Ht 5\' 2"  (1.575 m)   Wt 150 lb 4.8 oz (68.2 kg)   BMI 27.49 kg/m  CONSTITUTIONAL: Well-developed, well-nourished female in no acute distress.  HENT:  Normocephalic, atraumatic, External right and left ear normal.  EYES: Conjunctivae and EOM are normal. Pupils are equal, round, and reactive to light. No scleral icterus.  NECK: Normal range of motion, supple, no masses.  Normal thyroid.  SKIN: Skin is warm and dry. No rash noted. Not diaphoretic. No erythema. No pallor. MUSCULOSKELETAL: Normal range of motion. No tenderness.  No cyanosis, clubbing, or edema.   NEUROLOGIC: Alert and oriented to person, place, and time. Normal reflexes, muscle tone coordination.  PSYCHIATRIC: Normal mood and affect. Normal behavior. Normal judgment and thought content. CARDIOVASCULAR: Normal heart rate noted, regular rhythm RESPIRATORY: Clear to auscultation bilaterally. Effort and breath sounds normal, no problems with respiration noted. BREASTS: Symmetric in size. No masses,  tenderness, skin changes, nipple drainage, or lymphadenopathy bilaterally. Performed in the presence of a chaperone. ABDOMEN: Soft, no distention noted.  No tenderness, rebound or guarding.  PELVIC: Normal appearing external genitalia and urethral meatus; atrophic appearing vaginal mucosa and cervix.  No abnormal discharge noted.  Pap smear obtained.  Normal uterine size, no other palpable masses, no uterine or adnexal tenderness.  Performed in the presence of a chaperone.   Assessment and Plan:    1. Breast screening - MM 3D SCREEN BREAST BILATERAL; Future  2. Well woman exam with routine gynecological exam - Cytology - PAP( [facility]) Will follow up results of pap smear and manage accordingly. Routine preventative health maintenance measures emphasized. Please refer to After Visit Summary for other counseling recommendations.      , MD, FACOG Obstetrician & Gynecologist, [facility] for [facility]

## 2020-12-25 LAB — CYTOLOGY - PAP
Comment: NEGATIVE
Diagnosis: NEGATIVE
High risk HPV: NEGATIVE

## 2021-01-16 ENCOUNTER — Ambulatory Visit (HOSPITAL_COMMUNITY): Admission: EM | Admit: 2021-01-16 | Discharge: 2021-01-16 | Discharge status: Absent without leave | Payer: 59

## 2021-01-16 ENCOUNTER — Other Ambulatory Visit: Payer: Self-pay

## 2021-01-30 ENCOUNTER — Ambulatory Visit (INDEPENDENT_AMBULATORY_CARE_PROVIDER_SITE_OTHER): Payer: 59 | Admitting: Family Medicine

## 2021-01-30 ENCOUNTER — Other Ambulatory Visit: Payer: Self-pay

## 2021-01-30 ENCOUNTER — Encounter: Payer: Self-pay | Admitting: Family Medicine

## 2021-01-30 VITALS — BP 170/81 | HR 65 | Temp 97.8°F | Ht 62.0 in | Wt 149.0 lb

## 2021-01-30 DIAGNOSIS — K219 Gastro-esophageal reflux disease without esophagitis: Secondary | ICD-10-CM

## 2021-01-30 DIAGNOSIS — M542 Cervicalgia: Secondary | ICD-10-CM | POA: Diagnosis not present

## 2021-01-30 DIAGNOSIS — R0683 Snoring: Secondary | ICD-10-CM

## 2021-01-30 DIAGNOSIS — I10 Essential (primary) hypertension: Secondary | ICD-10-CM

## 2021-01-30 DIAGNOSIS — M79671 Pain in right foot: Secondary | ICD-10-CM

## 2021-01-30 MED ORDER — PANTOPRAZOLE SODIUM 40 MG PO TBEC: 40.0000 mg | DELAYED_RELEASE_TABLET | Freq: Every day | ORAL | 1 refills | Status: DC

## 2021-01-30 MED ORDER — MELOXICAM 15 MG PO TABS: 15.0000 mg | ORAL_TABLET | Freq: Every day | ORAL | 0 refills | Status: DC

## 2021-01-30 MED ORDER — AMLODIPINE BESYLATE 10 MG PO TABS: 10.0000 mg | ORAL_TABLET | Freq: Every day | ORAL | 3 refills | Status: DC

## 2021-01-30 MED ORDER — LISINOPRIL 20 MG PO TABS: 20.0000 mg | ORAL_TABLET | Freq: Every day | ORAL | 3 refills | Status: DC

## 2021-01-30 MED ORDER — GABAPENTIN 300 MG PO CAPS: 300.0000 mg | ORAL_CAPSULE | Freq: Every day | ORAL | 3 refills | Status: DC

## 2021-01-30 NOTE — Progress Notes (Signed)
3/16/20222:38 PM  Pam Hart 08/11/1961, 60 y.o., female [id number]  Chief Complaint  Patient presents with  . loud snoring     Getting worse over time   . pain in right foot toes    3 weeks worsened   . Hypertension    Usually sees 150- 140/ 75- 90    HPI:   Patient is a 60 y.o. female with past medical history significant for HTN, GERD who presents today for snoring and issues with toe.  HTN Amlodipine 10mg  Lisinopril 20mg  Didn't take medications today Discussed medication compliance BP Readings from Last 3 Encounters:  01/30/21 (!) 170/81  12/24/20 (!) 178/81  02/03/20 136/62    Snoring Has had increased issues with snoring Wakes up with sore throat Denies congestion Feels rested when wakes up Sleeps thru the night This has been bothering her husband  Right foot pain Has had right toe pain Hurts most on the bottom of the foot Hurts when she puts weight on it Started 3 weeks ago Started when son was cracking toes/foot massage Has never had this happen in the past Takes Tylenol/Ibuprofen for this Wants an xray Does not want to see a specialist  Declines colon cancer screen  Depression screen [facility] 2/9 01/30/2021 12/26/2020 11/23/2018  Decreased Interest 0 0 0  Down, Depressed, Hopeless 0 0 0  PHQ - 2 Score 0 0 0  Altered sleeping - 0 0  Tired, decreased energy - 0 0  Change in appetite - 0 0  Feeling bad or failure about yourself  - 0 0  Trouble concentrating - 0 0  Moving slowly or fidgety/restless - 0 0  Suicidal thoughts - 0 0  PHQ-9 Score - 0 0  Difficult doing work/chores - - Not difficult at all    Fall Risk  01/30/2021 11/23/2018 09/21/2018 05/07/2018 02/05/2018  Falls in the past year? 0 0 0 No No  Number falls in past yr: 0 - - - -  Injury with Fall? 0 - - - -  Follow up Falls evaluation completed - - - -     No Known Allergies  Prior to Admission medications   Medication Sig Start Date End Date Taking? Authorizing Provider  gabapentin  (NEURONTIN) 300 MG capsule Take 1-2 capsules (300-600 mg total) by mouth at bedtime. Patient taking differently: Take 300-600 mg by mouth daily as needed (For nerve pain). 11/23/18   12-13-1971, 01/22/19, MD  lisinopril (PRINIVIL,ZESTRIL) 20 MG tablet TAKE 1 TABLET BY MOUTH EVERY DAY 02/15/19   Meda Coffee, 02/17/19, MD  meloxicam (MOBIC) 15 MG tablet Take 15 mg by mouth daily.    [provider]  pantoprazole (PROTONIX) 40 MG tablet Take 1 tablet (40 mg total) by mouth daily. 03/02/19   Meda Coffee, 03/04/19, MD  tiZANidine (ZANAFLEX) 4 MG tablet Take 4 mg by mouth every 6 (six) hours as needed for muscle spasms.    [provider]  traMADol (ULTRAM) 50 MG tablet Take 1 tablet (50 mg total) by mouth every 12 (twelve) hours as needed. Patient not taking: Reported on 12/24/2020 11/23/18   02/21/2021, 01/22/19, MD    Past Medical History:  Diagnosis Date  . Anemia   . Arthritis   . Hypertension   . Thyroid disease     Past Surgical History:  Procedure Laterality Date  . APPENDECTOMY    . EYE SURGERY Right 2011   lasix     Social History  Tobacco Use  . Smoking status: Never Smoker  . Smokeless tobacco: Never Used  Substance Use Topics  . Alcohol use: No    Family History  Problem Relation Age of Onset  . Hypertension Mother   . Heart disease Mother   . Other Mother        blood clot  . Alzheimer's disease Father   . Glaucoma Father   . Diabetes Sister   . Hypertension Sister   . Colon cancer Neg Hx   . Colon polyps Neg Hx   . Esophageal cancer Neg Hx   . Kidney disease Neg Hx   . Gallbladder disease Neg Hx     Review of Systems  Constitutional: Negative for chills, fever and malaise/fatigue.  Eyes: Negative for blurred vision and double vision.  Respiratory: Negative for cough, shortness of breath and wheezing.        Snoring  Cardiovascular: Negative for chest pain, palpitations and leg swelling.  Gastrointestinal: Positive for heartburn. Negative  for abdominal pain, constipation, diarrhea, nausea and vomiting.  Genitourinary: Negative for dysuria, frequency and hematuria.  Musculoskeletal: Positive for joint pain (Right toes/foot) and neck pain. Negative for back pain.  Skin: Negative for rash.  Neurological: Negative for dizziness, weakness and headaches.     OBJECTIVE:  Today's Vitals   01/30/21 1414  BP: (!) 170/81  Pulse: 65  Temp: 97.8 F (36.6 C)  SpO2: 100%  Weight: 149 lb (67.6 kg)  Height: 5\' 2"  (1.575 m)   Body mass index is 27.25 kg/m.   Physical Exam Constitutional:      General: She is not in acute distress.    Appearance: Normal appearance. She is not ill-appearing.  HENT:     Head: Normocephalic.     Nose: Nose normal. No congestion.     Mouth/Throat:     Mouth: Mucous membranes are moist.     Pharynx: Oropharynx is clear. No oropharyngeal exudate or posterior oropharyngeal erythema.  Cardiovascular:     Rate and Rhythm: Normal rate and regular rhythm.     Pulses: Normal pulses.     Heart sounds: Normal heart sounds. No murmur heard. No friction rub. No gallop.   Pulmonary:     Effort: Pulmonary effort is normal. No respiratory distress.     Breath sounds: Normal breath sounds. No stridor. No wheezing, rhonchi or rales.  Abdominal:     General: Bowel sounds are normal.     Palpations: Abdomen is soft.     Tenderness: There is no abdominal tenderness.  Musculoskeletal:     Right lower leg: No edema.     Left lower leg: No edema.     Right foot: Normal.     Left foot: Normal.  Skin:    General: Skin is warm and dry.  Neurological:     Mental Status: She is alert and oriented to person, place, and time.  Psychiatric:        Mood and Affect: Mood normal.        Behavior: Behavior normal.     No results found for this or any previous visit (from the past 24 hour(s)).  No results found.   ASSESSMENT and PLAN  Problem List Items Addressed This Visit      Digestive   GERD  (gastroesophageal reflux disease)   Relevant Medications   pantoprazole (PROTONIX) 40 MG tablet     Other   Neck pain   Relevant Medications   meloxicam (MOBIC) 15 MG tablet  gabapentin (NEURONTIN) 300 MG capsule    Other Visit Diagnoses    Snoring    -  Primary   Relevant Orders   Ambulatory referral to Sleep Studies   Essential hypertension       Relevant Medications   amLODipine (NORVASC) 10 MG tablet   lisinopril (ZESTRIL) 20 MG tablet   Right foot pain       Relevant Orders   DG Foot Complete Right     Plan . Medication refills sent, discussed importance of medication compliance . Will follow-up with foot xray, continue rice, and OTC treatments for pain . Declines podiatry referral at this time . Referral sent to manage snoring . Declines colon cancer screen, r/se/b discussed   Return in about 3 months (around 05/02/2021).    Macario Carls Laniqua Torrens, FNP-BC Primary Care at [facility] [address] [place], [place] [address] Ph.  [phone] Fax [phone]

## 2021-01-30 NOTE — Patient Instructions (Addendum)
  Hypertension, Adult Hypertension is another name for high blood pressure. High blood pressure forces your heart to work harder to pump blood. This can cause problems over time. There are two numbers in a blood pressure reading. There is a top number (systolic) over a bottom number (diastolic). It is best to have a blood pressure that is below 120/80. Healthy choices can help lower your blood pressure, or you may need medicine to help lower it. What are the causes? The cause of this condition is not known. Some conditions may be related to high blood pressure. What increases the risk?  Smoking.  Having type 2 diabetes mellitus, high cholesterol, or both.  Not getting enough exercise or physical activity.  Being overweight.  Having too much fat, sugar, calories, or salt (sodium) in your diet.  Drinking too much alcohol.  Having long-term (chronic) kidney disease.  Having a family history of high blood pressure.  Age. Risk increases with age.  Race. You may be at higher risk if you are African American.  Gender. Men are at higher risk than women before age 45. After age 65, women are at higher risk than men.  Having obstructive sleep apnea.  Stress. What are the signs or symptoms?  High blood pressure may not cause symptoms. Very high blood pressure (hypertensive crisis) may cause: ? Headache. ? Feelings of worry or nervousness (anxiety). ? Shortness of breath. ? Nosebleed. ? A feeling of being sick to your stomach (nausea). ? Throwing up (vomiting). ? Changes in how you see. ? Very bad chest pain. ? Seizures. How is this treated?  This condition is treated by making healthy lifestyle changes, such as: ? Eating healthy foods. ? Exercising more. ? Drinking less alcohol.  Your health care provider may prescribe medicine if lifestyle changes are not enough to get your blood pressure under control, and if: ? Your top number is above 130. ? Your bottom number is  above 80.  Your personal target blood pressure may vary. Follow these instructions at home: Eating and drinking  If told, follow the DASH eating plan. To follow this plan: ? Fill one half of your plate at each meal with fruits and vegetables. ? Fill one fourth of your plate at each meal with whole grains. Whole grains include whole-wheat pasta, brown rice, and whole-grain bread. ? Eat or drink low-fat dairy products, such as skim milk or low-fat yogurt. ? Fill one fourth of your plate at each meal with low-fat (lean) proteins. Low-fat proteins include fish, chicken without skin, eggs, beans, and tofu. ? Avoid fatty meat, cured and processed meat, or chicken with skin. ? Avoid pre-made or processed food.  Eat less than 1,500 mg of salt each day.  Do not drink alcohol if: ? Your doctor tells you not to drink. ? You are pregnant, may be pregnant, or are planning to become pregnant.  If you drink alcohol: ? Limit how much you use to:  0-1 drink a day for women.  0-2 drinks a day for men. ? Be aware of how much alcohol is in your drink. In the [place] one drink equals one 12 oz bottle of beer (355 mL), one 5 oz glass of wine (148 mL), or one 1 oz glass of hard liquor (44 mL).   Lifestyle  Work with your doctor to stay at a healthy weight or to lose weight. Ask your doctor what the best weight is for you.  Get at least 30 minutes of   exercise most days of the week. This may include walking, swimming, or biking.  Get at least 30 minutes of exercise that strengthens your muscles (resistance exercise) at least 3 days a week. This may include lifting weights or doing Pilates.  Do not use any products that contain nicotine or tobacco, such as cigarettes, e-cigarettes, and chewing tobacco. If you need help quitting, ask your doctor.  Check your blood pressure at home as told by your doctor.  Keep all follow-up visits as told by your doctor. This is important.   Medicines  Take  over-the-counter and prescription medicines only as told by your doctor. Follow directions carefully.  Do not skip doses of blood pressure medicine. The medicine does not work as well if you skip doses. Skipping doses also puts you at risk for problems.  Ask your doctor about side effects or reactions to medicines that you should watch for. Contact a doctor if you:  Think you are having a reaction to the medicine you are taking.  Have headaches that keep coming back (recurring).  Feel dizzy.  Have swelling in your ankles.  Have trouble with your vision. Get help right away if you:  Get a very bad headache.  Start to feel mixed up (confused).  Feel weak or numb.  Feel faint.  Have very bad pain in your: ? Chest. ? Belly (abdomen).  Throw up more than once.  Have trouble breathing. Summary  Hypertension is another name for high blood pressure.  High blood pressure forces your heart to work harder to pump blood.  For most people, a normal blood pressure is less than 120/80.  Making healthy choices can help lower blood pressure. If your blood pressure does not get lower with healthy choices, you may need to take medicine. This information is not intended to replace advice given to you by your health care provider. Make sure you discuss any questions you have with your health care provider. Document Revised: 07/14/2018 Document Reviewed: 07/14/2018 Elsevier Patient Education  2021 Elsevier Inc.   If you have lab work done today you will be contacted with your lab results within the next 2 weeks.  If you have not heard from us then please contact us. The fastest way to get your results is to register for My Chart.   IF you received an x-ray today, you will receive an invoice from [facility]. Please contact [facility] at [phone] with questions or concerns regarding your invoice.   IF you received labwork today, you will receive an invoice from  [facility]. Please contact [facility] at [phone] with questions or concerns regarding your invoice.   Our billing staff will not be able to assist you with questions regarding bills from these companies.  You will be contacted with the lab results as soon as they are available. The fastest way to get your results is to activate your My Chart account. Instructions are located on the last page of this paperwork. If you have not heard from us regarding the results in 2 weeks, please contact this office.      

## 2021-02-04 ENCOUNTER — Ambulatory Visit
Admission: RE | Admit: 2021-02-04 | Discharge: 2021-02-04 | Disposition: A | Discharge status: Home / Self Care | Payer: 59 | Source: Ambulatory Visit | Attending: Family Medicine | Admitting: Family Medicine

## 2021-02-04 DIAGNOSIS — M79671 Pain in right foot: Secondary | ICD-10-CM

## 2021-02-05 ENCOUNTER — Other Ambulatory Visit: Payer: Self-pay | Admitting: Family Medicine

## 2021-02-05 ENCOUNTER — Telehealth: Payer: Self-pay

## 2021-02-05 ENCOUNTER — Telehealth: Payer: Self-pay | Admitting: Family Medicine

## 2021-02-05 DIAGNOSIS — M79671 Pain in right foot: Secondary | ICD-10-CM

## 2021-02-05 NOTE — Telephone Encounter (Signed)
Patient is returning Pam Hart call wanting to get her recent x-ray results

## 2021-02-05 NOTE — Telephone Encounter (Signed)
Patient called back to say it is ok to leave a detailed message if n/a

## 2021-02-05 NOTE — Telephone Encounter (Signed)
LVM for a return call for details of xray results

## 2021-02-05 NOTE — Progress Notes (Signed)
Abnormalities seen on xray: referral placed to podiatry. They will call you.

## 2021-02-13 ENCOUNTER — Ambulatory Visit: Payer: 59 | Admitting: Podiatry

## 2021-03-04 ENCOUNTER — Telehealth: Payer: Self-pay | Admitting: *Deleted

## 2021-03-04 NOTE — Telephone Encounter (Signed)
-----   Message from Marny Lowenstein, PA-C sent at 03/04/2021  1:41 PM EDT ----- Hello,   This patient has an order for mammogram that hasn't been scheduled. Can you please schedule?   Thank you,   Raynelle Fanning

## 2021-03-04 NOTE — Telephone Encounter (Signed)
Called [facility] and scheduled for first available appointment for 04/23/21 arrive 11:50 for 12:10 appointment.  Santiel Topper,RN

## 2021-03-04 NOTE — Telephone Encounter (Signed)
I called Pam Hart and informed her of appointment for mammogram. She voices understanding. Artha Chiasson,RN

## 2021-03-07 ENCOUNTER — Telehealth: Payer: Self-pay | Admitting: Neurology

## 2021-03-07 ENCOUNTER — Institutional Professional Consult (permissible substitution): Payer: 59 | Admitting: Neurology

## 2021-03-07 NOTE — Telephone Encounter (Signed)
Sleep consult cancelled for 4/21 (MD sick). Please call pt to r/s.

## 2021-04-23 ENCOUNTER — Other Ambulatory Visit: Payer: Self-pay

## 2021-04-23 ENCOUNTER — Ambulatory Visit
Admission: RE | Admit: 2021-04-23 | Discharge: 2021-04-23 | Disposition: A | Discharge status: Home / Self Care | Payer: 59 | Source: Ambulatory Visit | Attending: Obstetrics & Gynecology | Admitting: Obstetrics & Gynecology

## 2021-04-23 DIAGNOSIS — Z1239 Encounter for other screening for malignant neoplasm of breast: Secondary | ICD-10-CM

## 2021-04-25 ENCOUNTER — Ambulatory Visit (INDEPENDENT_AMBULATORY_CARE_PROVIDER_SITE_OTHER): Payer: 59 | Admitting: Neurology

## 2021-04-25 ENCOUNTER — Encounter: Payer: Self-pay | Admitting: Neurology

## 2021-04-25 VITALS — BP 147/75 | HR 60 | Ht 60.5 in | Wt 149.3 lb

## 2021-04-25 DIAGNOSIS — G4700 Insomnia, unspecified: Secondary | ICD-10-CM

## 2021-04-25 DIAGNOSIS — R0683 Snoring: Secondary | ICD-10-CM | POA: Diagnosis not present

## 2021-04-25 DIAGNOSIS — E663 Overweight: Secondary | ICD-10-CM | POA: Diagnosis not present

## 2021-04-25 DIAGNOSIS — R351 Nocturia: Secondary | ICD-10-CM | POA: Diagnosis not present

## 2021-04-25 NOTE — Progress Notes (Signed)
Subjective:    Patient ID: Pam Hart is a 60 y.o. female.  HPI    Pam Foley, MD, PhD [facility] [address], [address] [address] [place], [place] [address]  Dear Pam Hart,   I saw your patient, Pam Hart, upon your kind request, in my Sleep clinic today for initial consultation of her sleep disorder, in particular, concern for underlying obstructive sleep apnea.  The patient is unaccompanied today.  As you know, Pam Hart is a 60 year old right-handed woman with an underlying medical history of hypertension, anemia, arthritis, thyroid disease, and mildly overweight state, who reports snoring and difficulty initiating and maintaining sleep.  She denies any significant daytime somnolence or witnessed apneas or gasping sensation at night.  She has nocturia, no more than once per night on average.  She does not wake up with a headache typically.  She lives with her family and husband is bothered by her snoring and often ends up sleeping in a different bedroom.  She has a TV in her bedroom and does watch it at night but does not keep it on all night.  Her sleep schedule varies.  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  Her rise time also varies depending on when she went to bed.  She does not drink caffeine daily, she does not drink alcohol or smoke.  She lives with her husband and 3 children, they are all grown, youngest is 11.  They have no pets at the house.  She does not have a family history of sleep apnea but does report a family history of snoring.  She denies difficulty breathing through her nose but wakes up with a sore throat in the mornings often.  Her Epworth sleepiness score is 3 out of 24, fatigue severity score is 9 out of 63.   Her Past Medical History Is Significant For: Past Medical History:  Diagnosis Date   Anemia    Arthritis    Hypertension    Thyroid disease     Her  Past Surgical History Is Significant For: Past Surgical History:  Procedure Laterality Date   APPENDECTOMY     EYE SURGERY Right 2011   lasix     Her Family History Is Significant For: Family History  Problem Relation Age of Onset   Hypertension Mother    Heart disease Mother    Other Mother        blood clot   Alzheimer's disease Father    Glaucoma Father    Diabetes Sister    Hypertension Sister    Colon cancer Neg Hx    Colon polyps Neg Hx    Esophageal cancer Neg Hx    Kidney disease Neg Hx    Gallbladder disease Neg Hx     His Social History Is Significant For: Social History   Socioeconomic History   Marital status: Married    Spouse name: Not on file   Number of children: 4   Years of education: Not on file   Highest education level: Not on file  Occupational History   Occupation: Interpreter  Tobacco Use   Smoking status: Never   Smokeless tobacco: Never  Vaping Use   Vaping Use: Never used  Substance and Sexual Activity   Alcohol use: No   Drug use: No   Sexual activity: Yes    Partners: Male    Birth control/protection: Post-menopausal  Other Topics Concern   Not on file  Social History Narrative   Not on file   Social Determinants of Health   Financial Resource Strain: Not on file  Food Insecurity: Not on file  Transportation Needs: Not on file  Physical Activity: Not on file  Stress: Not on file  Social Connections: Not on file    Her Allergies Are:  No Known Allergies:   Her Current Medications Are:  Outpatient Encounter Medications as of 04/25/2021  Medication Sig   amLODipine (NORVASC) 10 MG tablet Take 1 tablet (10 mg total) by mouth daily.   gabapentin (NEURONTIN) 300 MG capsule Take 1-2 capsules (300-600 mg total) by mouth at bedtime.   lisinopril (ZESTRIL) 20 MG tablet Take 1 tablet (20 mg total) by mouth daily.   meloxicam (MOBIC) 15 MG tablet Take 1 tablet (15 mg total) by mouth daily.   pantoprazole (PROTONIX) 40 MG tablet  Take 1 tablet (40 mg total) by mouth daily.   tiZANidine (ZANAFLEX) 4 MG tablet Take 4 mg by mouth every 6 (six) hours as needed for muscle spasms.   traMADol (ULTRAM) 50 MG tablet Take 1 tablet (50 mg total) by mouth every 12 (twelve) hours as needed.   No facility-administered encounter medications on file as of 04/25/2021.  :   Review of Systems:  Out of a complete 14 point review of systems, all are reviewed and negative with the exception of these symptoms as listed below:  Review of Systems  Neurological:        Here for sleep consult. No prior sleep study and snoring is present.  Epworth Sleepiness Scale 0= would never doze 1= slight chance of dozing 2= moderate chance of dozing 3= high chance of dozing  Sitting and reading:0 Watching TV:1 Sitting inactive in a public place (ex. Theater or meeting):0 As a passenger in a car for an hour without a break:0 Lying down to rest in the afternoon:1 Sitting and talking to someone:0 Sitting quietly after lunch (no alcohol):1 In a car, while stopped in traffic:0 Total:3    Objective:  Neurological Exam  Physical Exam Physical Examination:   Vitals:   04/25/21 1229  BP: (!) 147/75  Pulse: 60    General Examination: The patient is a very pleasant 60 y.o. female in no acute distress. She appears well-developed and well-nourished and well groomed.   HEENT: Normocephalic, atraumatic, pupils are equal, round and reactive to light, extraocular tracking is good without limitation to gaze excursion or nystagmus noted. Hearing is grossly intact. Face is symmetric with normal facial animation. Speech is clear with no dysarthria noted. There is no hypophonia. There is no lip, neck/head, jaw or voice tremor. Neck is supple with full range of passive and active motion. There are no carotid bruits on auscultation. Oropharynx exam reveals: mild mouth dryness, adequate dental hygiene with several missing teeth in the back, mild airway crowding  secondary to tonsillar size of 1+, slightly redundant soft palate and small airway entry, neck circumference of 13 inches.  She has a minimal overbite.  Tongue protrudes centrally and palate elevates symmetrically.  Nasal inspection reveals mild mucosal swelling and mild inferior turbinate hypertrophy, right more than left, no significant deviated septum.   Chest: Clear to auscultation without wheezing, rhonchi or crackles noted.  Heart: S1+S2+0, regular and normal without murmurs, rubs or gallops noted.   Abdomen: Soft, non-tender and non-distended with normal bowel sounds appreciated on auscultation.  Extremities: There is no pitting edema in the  distal lower extremities bilaterally.   Skin: Warm and dry without trophic changes noted.   Musculoskeletal: exam reveals no obvious joint deformities, tenderness or joint swelling or erythema.   Neurologically:  Mental status: The patient is awake, alert and oriented in all 4 spheres. Her immediate and remote memory, attention, language skills and fund of knowledge are appropriate. There is no evidence of aphasia, agnosia, apraxia or anomia. Speech is clear with normal prosody and enunciation. Thought process is linear. Mood is normal and affect is normal.  Cranial nerves II - XII are as described above under HEENT exam.  Motor exam: Normal bulk, strength and tone is noted. There is no tremor, Romberg is negative. Fine motor skills and coordination: grossly intact.  Cerebellar testing: No dysmetria or intention tremor. There is no truncal or gait ataxia.  Sensory exam: intact to light touch in the upper and lower extremities.  Gait, station and balance: She stands easily. No veering to one side is noted. No leaning to one side is noted. Posture is age-appropriate and stance is narrow based. Gait shows normal stride length and normal pace. No problems turning are noted. Tandem walk is slightly challenging in the beginning.    Assessment and Plan:    In summary, Pam Hart is a very pleasant 60 y.o.-year old female with an underlying medical history of hypertension, anemia, arthritis, thyroid disease, and mildly overweight state, whose history and physical exam are concerning for obstructive sleep apnea (OSA). I had a long chat with the patient about my findings and the diagnosis of OSA, its prognosis and treatment options. We talked about medical treatments, surgical interventions and non-pharmacological approaches. I explained in particular the risks and ramifications of untreated moderate to severe OSA, especially with respect to developing cardiovascular disease down the Road, including congestive heart failure, difficult to treat hypertension, cardiac arrhythmias, or stroke. Even type 2 diabetes has, in part, been linked to untreated OSA. Symptoms of untreated OSA include daytime sleepiness, memory problems, mood irritability and mood disorder such as depression and anxiety, lack of energy, as well as recurrent headaches, especially morning headaches. We talked about trying to maintain a healthy lifestyle in general, as well as the importance of weight control. We also talked about the importance of good sleep hygiene. I recommended the following at this time: sleep study.   I explained the sleep test procedure to the patient and also outlined possible surgical and non-surgical treatment options of OSA, including the use of a custom-made dental device (which would require a referral to a specialist dentist or oral surgeon), upper airway surgical options (which would involve a referral to an ENT surgeon). I also explained the CPAP or autoPAP treatment option to the patient, who indicated that she would be willing to consider positive airway pressure treatment if the need arises.  We will pick up our discussion about treatment options after her testing.  We will keep her posted as to her test results by phone call as well.  I answered all her  questions today and she was in agreement.  Thank you very much for allowing me to participate in the care of this nice patient. If I can be of any further assistance to you please do not hesitate to call me at [phone].  Sincerely,   Pam Foley, MD, PhD

## 2021-04-25 NOTE — Patient Instructions (Signed)

## 2021-05-06 ENCOUNTER — Telehealth: Payer: Self-pay

## 2021-05-06 NOTE — Telephone Encounter (Signed)
Pt would like a call back tomorrow.

## 2021-05-15 ENCOUNTER — Other Ambulatory Visit: Payer: Self-pay

## 2021-05-15 ENCOUNTER — Encounter (HOSPITAL_COMMUNITY): Payer: Self-pay

## 2021-05-15 ENCOUNTER — Ambulatory Visit (HOSPITAL_COMMUNITY)
Admission: EM | Admit: 2021-05-15 | Discharge: 2021-05-15 | Disposition: A | Discharge status: Home / Self Care | Payer: 59 | Attending: Urgent Care | Admitting: Urgent Care

## 2021-05-15 DIAGNOSIS — R0683 Snoring: Secondary | ICD-10-CM | POA: Insufficient documentation

## 2021-05-15 DIAGNOSIS — R1011 Right upper quadrant pain: Secondary | ICD-10-CM | POA: Insufficient documentation

## 2021-05-15 DIAGNOSIS — R5382 Chronic fatigue, unspecified: Secondary | ICD-10-CM | POA: Insufficient documentation

## 2021-05-15 DIAGNOSIS — R0789 Other chest pain: Secondary | ICD-10-CM | POA: Insufficient documentation

## 2021-05-15 DIAGNOSIS — M542 Cervicalgia: Secondary | ICD-10-CM | POA: Diagnosis present

## 2021-05-15 DIAGNOSIS — R5383 Other fatigue: Secondary | ICD-10-CM

## 2021-05-15 LAB — CBC WITH DIFFERENTIAL/PLATELET
Abs Immature Granulocytes: 0.02 10*3/uL (ref 0.00–0.07)
Basophils Absolute: 0 10*3/uL (ref 0.0–0.1)
Basophils Relative: 0 %
Eosinophils Absolute: 0.1 10*3/uL (ref 0.0–0.5)
Eosinophils Relative: 2 %
HCT: 44.7 % (ref 36.0–46.0)
Hemoglobin: 14.3 g/dL (ref 12.0–15.0)
Immature Granulocytes: 0 %
Lymphocytes Relative: 45 %
Lymphs Abs: 2.9 10*3/uL (ref 0.7–4.0)
MCH: 26.7 pg (ref 26.0–34.0)
MCHC: 32 g/dL (ref 30.0–36.0)
MCV: 83.4 fL (ref 80.0–100.0)
Monocytes Absolute: 0.4 10*3/uL (ref 0.1–1.0)
Monocytes Relative: 6 %
Neutro Abs: 3.1 10*3/uL (ref 1.7–7.7)
Neutrophils Relative %: 47 %
Platelets: 297 10*3/uL (ref 150–400)
RBC: 5.36 MIL/uL — ABNORMAL HIGH (ref 3.87–5.11)
RDW: 13.2 % (ref 11.5–15.5)
WBC: 6.5 10*3/uL (ref 4.0–10.5)
nRBC: 0 % (ref 0.0–0.2)

## 2021-05-15 LAB — COMPREHENSIVE METABOLIC PANEL
ALT: 27 U/L (ref 0–44)
AST: 23 U/L (ref 15–41)
Albumin: 3.9 g/dL (ref 3.5–5.0)
Alkaline Phosphatase: 69 U/L (ref 38–126)
Anion gap: 7 (ref 5–15)
BUN: 9 mg/dL (ref 6–20)
CO2: 29 mmol/L (ref 22–32)
Calcium: 9.5 mg/dL (ref 8.9–10.3)
Chloride: 103 mmol/L (ref 98–111)
Creatinine, Ser: 0.81 mg/dL (ref 0.44–1.00)
GFR, Estimated: >60 mL/min (ref >60)
Glucose, Bld: 131 mg/dL — ABNORMAL HIGH (ref 70–99)
Potassium: 4.5 mmol/L (ref 3.5–5.1)
Sodium: 139 mmol/L (ref 135–145)
Total Bilirubin: 0.4 mg/dL (ref 0.3–1.2)
Total Protein: 6.9 g/dL (ref 6.5–8.1)

## 2021-05-15 MED ORDER — MELOXICAM 7.5 MG PO TABS: ORAL_TABLET | ORAL | 0 refills | Status: DC

## 2021-05-15 MED ORDER — CYCLOBENZAPRINE HCL 5 MG PO TABS: 5.0000 mg | ORAL_TABLET | Freq: Three times a day (TID) | ORAL | 0 refills | Status: DC | PRN

## 2021-05-15 NOTE — ED Provider Notes (Addendum)
[facility]   MRN: [id number] DOB: 1961/04/04  Subjective:   Pam Hart is a 60 y.o. female presenting for 1 month history of chronic right upper quadrant abdominal pain that comes and goes.  Patient cannot recall if there is any specific association with food but states that it is a bit more constant now.  She also has some epigastric, lower midsternal chest pain.  She has had this intermittently for a long time now.  Also complains of chronic fatigue.  She is currently being worked up for sleep apnea.  She does have a history of hypertension and is on medication for this.  She usually uses medications like meloxicam and tramadol for her pain but is now out of this medication and would like a refill.  Does not have a PCP.  She also has been using pantoprazole for acid reflux.  Has a history of thyroid disease but is not on any medication for this now.  Denies fever, diaphoresis, nausea, vomiting, constipation, hematuria, bloody stools.  No history of GI issue such as diverticulitis or Crohn's disease, ulcerative colitis.  Per patient has not seen a cardiologist.  No current facility-administered medications for this encounter.  Current Outpatient Medications:    amLODipine (NORVASC) 10 MG tablet, Take 1 tablet (10 mg total) by mouth daily., Disp: 90 tablet, Rfl: 3   gabapentin (NEURONTIN) 300 MG capsule, Take 1-2 capsules (300-600 mg total) by mouth at bedtime., Disp: 60 capsule, Rfl: 3   lisinopril (ZESTRIL) 20 MG tablet, Take 1 tablet (20 mg total) by mouth daily., Disp: 90 tablet, Rfl: 3   meloxicam (MOBIC) 15 MG tablet, Take 1 tablet (15 mg total) by mouth daily., Disp: 45 tablet, Rfl: 0   pantoprazole (PROTONIX) 40 MG tablet, Take 1 tablet (40 mg total) by mouth daily., Disp: 90 tablet, Rfl: 1   tiZANidine (ZANAFLEX) 4 MG tablet, Take 4 mg by mouth every 6 (six) hours as needed for muscle spasms., Disp: , Rfl:    traMADol (ULTRAM) 50 MG tablet, Take 1 tablet (50 mg  total) by mouth every 12 (twelve) hours as needed., Disp: 30 tablet, Rfl: 0   No Known Allergies  Past Medical History:  Diagnosis Date   Anemia    Arthritis    Hypertension    Thyroid disease      Past Surgical History:  Procedure Laterality Date   APPENDECTOMY     EYE SURGERY Right 2011   lasix     Family History  Problem Relation Age of Onset   Hypertension Mother    Heart disease Mother    Other Mother        blood clot   Alzheimer's disease Father    Glaucoma Father    Diabetes Sister    Hypertension Sister    Colon cancer Neg Hx    Colon polyps Neg Hx    Esophageal cancer Neg Hx    Kidney disease Neg Hx    Gallbladder disease Neg Hx     Social History   Tobacco Use   Smoking status: Never   Smokeless tobacco: Never  Vaping Use   Vaping Use: Never used  Substance Use Topics   Alcohol use: No   Drug use: No    ROS   Objective:   Vitals: BP 135/82   Pulse 64   Temp 98.2 F (36.8 C)   Resp 18   SpO2 100%   Physical Exam Constitutional:  General: She is not in acute distress.    Appearance: Normal appearance. She is well-developed and normal weight. She is not ill-appearing, toxic-appearing or diaphoretic.  HENT:     Head: Normocephalic and atraumatic.     Right Ear: External ear normal.     Left Ear: External ear normal.     Nose: Nose normal.     Mouth/Throat:     Mouth: Mucous membranes are moist.     Pharynx: Oropharynx is clear.  Eyes:     General: No scleral icterus.    Extraocular Movements: Extraocular movements intact.     Pupils: Pupils are equal, round, and reactive to light.  Cardiovascular:     Rate and Rhythm: Normal rate and regular rhythm.     Heart sounds: Normal heart sounds. No murmur heard.   No friction rub. No gallop.  Pulmonary:     Effort: Pulmonary effort is normal. No respiratory distress.     Breath sounds: Normal breath sounds. No stridor. No wheezing, rhonchi or rales.  Chest:     Chest wall: No  tenderness.  Abdominal:     General: Bowel sounds are normal. There is no distension.     Palpations: Abdomen is soft. There is no mass.     Tenderness: There is generalized abdominal tenderness and tenderness in the right upper quadrant and epigastric area. There is no right CVA tenderness, left CVA tenderness, guarding or rebound.  Skin:    General: Skin is warm and dry.     Coloration: Skin is not pale.     Findings: No rash.  Neurological:     General: No focal deficit present.     Mental Status: She is alert and oriented to person, place, and time.  Psychiatric:        Mood and Affect: Mood normal.        Behavior: Behavior normal.        Thought Content: Thought content normal.        Judgment: Judgment normal.    ED ECG REPORT   Date: 05/15/2021  Rate: 59bpm  Rhythm: Sinus bradycardia  QRS Axis: normal  Intervals: normal  ST/T Wave abnormalities: nonspecific T wave changes  Conduction Disutrbances:none  Narrative Interpretation: Sinus bradycardia 59 bpm with nonspecific T wave flattening in lead III.  Very comparable and unchanged from previous EKG.  Old EKG Reviewed: unchanged  I have personally reviewed the EKG tracing and agree with the computerized printout as noted.    Assessment and Plan :   PDMP not reviewed this encounter.  1. Right upper quadrant abdominal pain   2. Other fatigue   3. Snoring   4. Chronic fatigue   5. Atypical chest pain   6. Neck pain     Patient has right upper quadrant and epigastric pain on exam.  There is no guarding, rebound tenderness or abdominal distention.  She is hemodynamically stable.  Recommended continued follow-up with the neurologist she is working with and OSA.  I did refill her meloxicam and provided her with a muscle relaxant at her request.  I do not see any signs of ACS but counseled her on this and recommended strict ER precautions.  I also provided her with information to [facility] for an urgent  consultation with cardiology.  Patient placed into PCP assistance program. Counseled patient on potential for adverse effects with medications prescribed today, patient verbalized understanding.    Wallis Bamberg, PA-C 05/15/21 1128

## 2021-05-15 NOTE — Discharge Instructions (Addendum)
If your pain worsens then please report to the emergency room for further evaluation including a CT scan or evaluation to rule out a heart event such as a heart attack. I am checking your lab tests today and will call you only if they are really high or outside of normal range. Please contact [facility] otherwise to see about a consultation for your chest pain. I have refilled meloxicam for pain and cyclobenzaprine as a muscle relaxant. You will be contacted to have a PCP appointment set up.

## 2021-05-15 NOTE — ED Triage Notes (Addendum)
Pt reports tightness and pain in central chest that comes and goes for the past 4 days and intermittent right side epigastric pain starting about a month ago. States it feels hard in right epigastrium as though is swelling. Also reports fatigue ongoing approx 1 month and muscle pain onset approx. One year ago.

## 2021-05-16 ENCOUNTER — Telehealth (HOSPITAL_BASED_OUTPATIENT_CLINIC_OR_DEPARTMENT_OTHER): Payer: Self-pay

## 2021-05-16 NOTE — Telephone Encounter (Signed)
-----   Message from Aaron Edelman, RN sent at 05/16/2021  3:25 PM EDT ----- Regarding: UC to PCP Patient needs to establish with PCP - routine

## 2021-05-29 ENCOUNTER — Ambulatory Visit (INDEPENDENT_AMBULATORY_CARE_PROVIDER_SITE_OTHER): Payer: 59 | Admitting: Neurology

## 2021-05-29 DIAGNOSIS — G4700 Insomnia, unspecified: Secondary | ICD-10-CM

## 2021-05-29 DIAGNOSIS — R351 Nocturia: Secondary | ICD-10-CM

## 2021-05-29 DIAGNOSIS — R0683 Snoring: Secondary | ICD-10-CM

## 2021-05-29 DIAGNOSIS — E663 Overweight: Secondary | ICD-10-CM

## 2021-05-29 DIAGNOSIS — G4733 Obstructive sleep apnea (adult) (pediatric): Secondary | ICD-10-CM

## 2021-06-04 NOTE — Progress Notes (Signed)
See procedure note.

## 2021-06-10 NOTE — Addendum Note (Signed)
Addended by: Huston Foley on: 06/10/2021 05:42 PM   Modules accepted: Orders

## 2021-06-10 NOTE — Procedures (Signed)
       [facility]  HOME SLEEP TEST (Watch PAT) REPORT  STUDY DATE: 05/30/2021  DOB: March 06, 1961  MRN: [id number]  ORDERING CLINICIAN: Huston Foley, MD, PhD   REFERRING CLINICIAN: Macario Carls Just, NP  CLINICAL INFORMATION/HISTORY: 60 year old right-handed woman with an underlying medical history of hypertension, anemia, arthritis, thyroid disease, and mildly overweight state, who reports snoring and difficulty initiating and maintaining sleep.    Epworth sleepiness score: 3/24.  BMI: 28.7 kg/m  FINDINGS:   Sleep Summary:   Total Recording Time (hours, min): 6 hours, 25 minutes  Total Sleep Time (hours, min):  5 hours, 27 minutes   Percent REM (%):    22.7%   Respiratory Indices:   Calculated pAHI (per hour):  7.9/hour         REM pAHI:    5.9/hour       NREM pAHI: 8.1/hour  Oxygen Saturation Statistics:    Oxygen Saturation (%) Mean: 94%   Minimum oxygen saturation (%):                 88%   O2 Saturation Range (%): 88-98%    O2 Saturation (minutes) <=88%: 0 min  Pulse Rate Statistics:   Pulse Mean (bpm):    59/min    Pulse Range (47-90/min)   IMPRESSION: OSA (obstructive sleep apnea), mild  RECOMMENDATION:  This home sleep test demonstrates overall mild obstructive sleep apnea with a total AHI of 7.9/hour and O2 nadir of 88%.  Snoring was detected, in the mild to moderate range.  Given the patient's medical history and sleep related complaints, treatment with positive airway pressure is recommended. This can be achieved in the form of autoPAP trial/titration at home. A  full night CPAP titration study will help with proper treatment settings and mask fitting if needed. Alternative treatments include weight loss along with avoidance of the supine sleep position, or an oral appliance in appropriate candidates.   Please note that untreated obstructive sleep apnea carries additional perioperative morbidity. Patients with significant obstructive  sleep apnea should receive perioperative PAP therapy and the surgeons and particularly the anesthesiologist should be informed of the diagnosis and the severity of the sleep disordered breathing. The patient should be cautioned not to drive, work at heights, or operate dangerous or heavy equipment when tired or sleepy. Review and reiteration of good sleep hygiene measures should be pursued with any patient. Other causes of the patient's symptoms, including circadian rhythm disturbances, an underlying mood disorder, medication effect and/or an underlying medical problem cannot be ruled out based on this test. Clinical correlation is recommended.   The patient and her referring provider will be notified of the test results. The patient will be seen in follow up in sleep clinic at [facility].  I certify that I have reviewed the raw data recording prior to the issuance of this report in accordance with the standards of the American Academy of Sleep Medicine (AASM).  INTERPRETING PHYSICIAN:   Huston Foley, MD, PhD  Board Certified in Neurology and Sleep Medicine  [facility] [address] [place], [place] [address] [phone]

## 2021-06-12 ENCOUNTER — Encounter: Payer: Self-pay | Admitting: *Deleted

## 2021-06-12 ENCOUNTER — Telehealth: Payer: Self-pay | Admitting: *Deleted

## 2021-06-12 NOTE — Telephone Encounter (Signed)
-----   Message from Huston Foley, MD sent at 06/10/2021  5:42 PM EDT ----- Patient referred by Macario Carls Just, NP, seen by me on 04/25/2021, HST on 05/30/2021.    Please call and notify the patient that the recent home sleep test showed obstructive sleep apnea. OSA is overall mild, but worth treating to see if she feels better after treatment. To that end I recommend treatment for this in the form of autoPAP, which means, that we don't have to bring her in for a sleep study with CPAP, but will let her try an autoPAP machine at home, through a DME company (of her choice, or as per insurance requirement). The DME representative will educate her on how to use the machine, how to put the mask on, etc. I have placed an order in the chart. Please send referral, talk to patient, send report to referring MD. We will need a FU in sleep clinic for 10 weeks post-PAP set up, please arrange that with me or one of our NPs. Thanks,   Huston Foley, MD, PhD [facility])

## 2021-06-12 NOTE — Telephone Encounter (Signed)
Called pt and relayed results.  She is ok to try the autopap. Will mail letter. I hope this letter finds you well!  I just wanted to reiterate what I discussed with you on the phone today.  Dr. Frances Furbish reviewed your sleep study results and found that you do have sleep apnea and recommends that you start an auto-PAP. An auto-PAP is set at a range of pressures, instead of one set pressure, such as a CPAP.  An order was sent to [facility] to start you on an auto-PAP. Their number is [phone]. If you have not heard from them by8-3-22, please call them. [facility] will be responsible for providing you auto-PAP supplies, fixing your auto-PAP, and any other auto-PAP troubleshooting issues.  Once you get your auto-PAP, please try and use it at least four or more hours per night. If you can use it all night, this is great, and keep it up!  A follow up appointment has been made for you on 09-02-21 at 1PM. Please arrive 15 minutes to this appointment and bring your auto-PAP. This appointment is for insurance purposes; we have to submit proof of compliance on auto-PAP to your insurance company in order for them to continue paying for your auto-PAP.  If you have not started your auto-PAP by 09-02-21, please call me and we will push your appointment out farther to make sure that we have 30-90 days worth of data to review at your appointment. It is an insurance requirement that you complete a follow up appointment with Dr. Frances Furbish between 30 and 90 days after starting your auto-PAP.  You must follow up with Dr. Frances Furbish at least yearly after this first compliance visit to continue to get auto-PAP supplies from [facility].

## 2021-06-17 ENCOUNTER — Ambulatory Visit (HOSPITAL_BASED_OUTPATIENT_CLINIC_OR_DEPARTMENT_OTHER): Payer: 59 | Admitting: Nurse Practitioner

## 2021-06-17 NOTE — Progress Notes (Deleted)
Shawna Clamp, DNP, AGNP-c Primary Care Services ______________________________________________________________________  HPI Pam Hart is a 60 y.o. female presenting to [facility] at [facility] today to establish care.   Patient Care Team: Pcp, No as PCP - General  Health Maintenance  Topic Date Due   Colon Cancer Screening  Never done   Zoster (Shingles) Vaccine (1 of 2) Never done   COVID-19 Vaccine (3 - Booster for Pfizer series) 09/02/2020   Flu Shot  06/17/2021   Mammogram  04/24/2023   Pap Smear  12/25/2023   Tetanus Vaccine  01/23/2025   Hepatitis C Screening: USPSTF Recommendation to screen - Ages 18-79 yo.  Completed   HIV Screening  Completed   Pneumococcal Vaccination  Aged Out   HPV Vaccine  Aged Out     Concerns today: ***   Patient Active Problem List   Diagnosis Date Noted   Sinus bradycardia 02/03/2020   Pre-syncope 02/02/2020   Right elbow pain 01/05/2017   Right hand pain 01/05/2017   Thyroid nodule 07/29/2016   Neck pain 10/29/2015   Hypertension, uncontrolled 08/22/2015   Chest pain with moderate risk of acute coronary syndrome 08/22/2015   GERD (gastroesophageal reflux disease) 08/22/2015   Hyperlipidemia 03/17/2013   FATIGUE 08/05/2007    PHQ9 Today: Depression screen [facility] 2/9 01/30/2021 12/26/2020 11/23/2018  Decreased Interest 0 0 0  Down, Depressed, Hopeless 0 0 0  PHQ - 2 Score 0 0 0  Altered sleeping - 0 0  Tired, decreased energy - 0 0  Change in appetite - 0 0  Feeling bad or failure about yourself  - 0 0  Trouble concentrating - 0 0  Moving slowly or fidgety/restless - 0 0  Suicidal thoughts - 0 0  PHQ-9 Score - 0 0  Difficult doing work/chores - - Not difficult at all   GAD7 Today: GAD 7 : Generalized Anxiety Score 12/26/2020  Nervous, Anxious, on Edge 0  Control/stop worrying 0  Worry too much - different things 0  Trouble relaxing 0  Restless 0  Easily annoyed or irritable 0   Afraid - awful might happen 0  Total GAD 7 Score 0   ______________________________________________________________________ PMH Past Medical History:  Diagnosis Date   Anemia    Arthritis    Hypertension    Thyroid disease     ROS All review of systems negative except what is listed in the HPI  PHYSICAL EXAM Physical Exam ______________________________________________________________________ ASSESSMENT AND PLAN Problem List Items Addressed This Visit   None   Education provided today during visit and on AVS for patient to review at home.  Diet and Exercise recommendations provided.  Current diagnoses and recommendations discussed. HM recommendations reviewed with recommendations.    Outpatient Encounter Medications as of 06/17/2021  Medication Sig   amLODipine (NORVASC) 10 MG tablet Take 1 tablet by mouth daily.   lisinopril (ZESTRIL) 20 MG tablet Take 1 tablet by mouth daily.   [DISCONTINUED] tiZANidine (ZANAFLEX) 4 MG tablet Take 1 tablet by mouth every 8 (eight) hours as needed.   gabapentin (NEURONTIN) 300 MG capsule Take 1-2 capsules (300-600 mg total) by mouth at bedtime.   meloxicam (MOBIC) 7.5 MG tablet Take 1-2 tablets once daily as needed for pain.   pantoprazole (PROTONIX) 40 MG tablet Take 1 tablet (40 mg total) by mouth daily.   [DISCONTINUED] amLODipine (NORVASC) 10 MG tablet Take 1 tablet (10 mg total) by mouth daily.   [DISCONTINUED] cyclobenzaprine (FLEXERIL) 5 MG tablet  Take 1 tablet (5 mg total) by mouth 3 (three) times daily as needed for muscle spasms.   [DISCONTINUED] lisinopril (ZESTRIL) 20 MG tablet Take 1 tablet (20 mg total) by mouth daily.   [DISCONTINUED] traMADol (ULTRAM) 50 MG tablet Take 1 tablet (50 mg total) by mouth every 12 (twelve) hours as needed.   [DISCONTINUED] traMADol (ULTRAM) 50 MG tablet Take by mouth.   No facility-administered encounter medications on file as of 06/17/2021.    No follow-ups on file.  Time: ***minutes, >50%  spent counseling, care coordination, chart review, and documentation.   Tollie Eth, DNP, AGNP-c

## 2021-06-19 ENCOUNTER — Telehealth (HOSPITAL_BASED_OUTPATIENT_CLINIC_OR_DEPARTMENT_OTHER): Payer: Self-pay

## 2021-06-19 NOTE — Telephone Encounter (Signed)
Rosendo Gros, RN got it!   Thanks!       Previous Messages    ----- Message -----  From: Guy Begin, RN  Sent: 06/12/2021   2:35 PM EDT  To: Wilford Sports  Subject: autopap new user                               Hi, order for new autopap user.  Keishia M. Blase   Female, 60 y.o., [birth year]   MRN:  [id number]  Thank you.  Sandy RN GNA

## 2021-06-19 NOTE — Telephone Encounter (Signed)
Called Pt to resched NS NP appt

## 2021-08-01 ENCOUNTER — Telehealth: Payer: Self-pay | Admitting: Neurology

## 2021-08-01 DIAGNOSIS — G4733 Obstructive sleep apnea (adult) (pediatric): Secondary | ICD-10-CM

## 2021-08-01 NOTE — Telephone Encounter (Signed)
LMVM for pt that returned call about cpap machine.

## 2021-08-01 NOTE — Telephone Encounter (Signed)
Secure message sent to [facility] regarding d/c orders for PAP therapy.

## 2021-08-01 NOTE — Telephone Encounter (Signed)
I spoke with the patient and I advised her of Dr. Teofilo Pod recommendations that we could try lower pressure and that might be more tolerable for her.  the patient verbalized understanding and appreciation but she has declined to use the AutoPap any further.  She stated she is not comfortable.  I discussed perhaps she needs another mask.  She stated she already looked at all of the mask.  She understands the risk of not treating sleep apnea which could lead to stroke, heart attack, heart disease, memory trouble, headaches and migraines.  She stated she will not use the machine.  I let her know I would pass the message along to Dr. Frances Furbish and we would notify her DME company.

## 2021-08-01 NOTE — Telephone Encounter (Signed)
Pt called, have decided do not want to use the CPAP machine. Would like a call from the nurse.

## 2021-08-01 NOTE — Telephone Encounter (Signed)
D/C autoPAP, per patient request.

## 2021-08-01 NOTE — Addendum Note (Signed)
Addended by: Bertram Savin on: 08/01/2021 03:11 PM   Modules accepted: Orders

## 2021-08-01 NOTE — Telephone Encounter (Signed)
Of note, the patient has requested to keep her next appointment with Dr. Frances Furbish on 09/02/2021.

## 2021-08-01 NOTE — Addendum Note (Signed)
Addended by: Huston Foley on: 08/01/2021 03:16 PM   Modules accepted: Orders

## 2021-08-01 NOTE — Telephone Encounter (Signed)
Pt returned call.  She stated she tried using the autopap for 5 days.  Could only make it for 1.5hours on one day.  Too much air, could not breathe.  She said since her OSA was mild, she is choosing not to do this.  She wants to return the machine.  She is aware of OSA risks (stroke, CVD, PULM,).  I relayed will relay to Dr. Frances Furbish and let her know.  She appreciated call back.

## 2021-08-01 NOTE — Telephone Encounter (Signed)
Please advise patient that we could try a lower pressure, if she is agreeable to trying AutoPap again, we can lower the pressure to 4 to 8 cm from currently 5 to 11 cm. If she is agreeable, please send a pressure change order to her DME company.

## 2021-08-06 ENCOUNTER — Ambulatory Visit (HOSPITAL_BASED_OUTPATIENT_CLINIC_OR_DEPARTMENT_OTHER): Payer: 59 | Admitting: Nurse Practitioner

## 2021-08-06 ENCOUNTER — Other Ambulatory Visit: Payer: Self-pay | Admitting: Physician Assistant

## 2021-08-06 DIAGNOSIS — R1011 Right upper quadrant pain: Secondary | ICD-10-CM

## 2021-08-09 ENCOUNTER — Ambulatory Visit
Admission: RE | Admit: 2021-08-09 | Discharge: 2021-08-09 | Disposition: A | Discharge status: Home / Self Care | Payer: 59 | Source: Ambulatory Visit | Attending: Physician Assistant | Admitting: Physician Assistant

## 2021-08-09 DIAGNOSIS — R1011 Right upper quadrant pain: Secondary | ICD-10-CM

## 2021-09-02 ENCOUNTER — Ambulatory Visit: Payer: Self-pay | Admitting: Neurology

## 2021-09-04 ENCOUNTER — Ambulatory Visit (INDEPENDENT_AMBULATORY_CARE_PROVIDER_SITE_OTHER): Payer: 59 | Admitting: Adult Health

## 2021-09-04 ENCOUNTER — Encounter: Payer: Self-pay | Admitting: Adult Health

## 2021-09-04 VITALS — BP 146/83 | HR 61 | Ht 61.0 in | Wt 144.8 lb

## 2021-09-04 DIAGNOSIS — G4733 Obstructive sleep apnea (adult) (pediatric): Secondary | ICD-10-CM | POA: Diagnosis not present

## 2021-09-04 NOTE — Progress Notes (Signed)
PATIENT: Pam Hart DOB: Aug 05, 1961  REASON FOR VISIT: follow up HISTORY FROM: patient PRIMARY NEUROLOGIST:   HISTORY OF PRESENT ILLNESS: Today 09/04/21:  Pam Hart is a 60 year old female with a history of obstructive sleep apnea on CPAP.  She returns today for follow-up.  The patient states that she briefly tried to use the CPAP but felt that the pressure was too high and the mask was uncomfortable.  She also states that she developed an ear infection therefore she quit using it.  Patient returns today to discuss CPAP or other options.  HISTORY Pam Hart is a 60 year old right-handed woman with an underlying medical history of hypertension, anemia, arthritis, thyroid disease, and mildly overweight state, who reports snoring and difficulty initiating and maintaining sleep.  She denies any significant daytime somnolence or witnessed apneas or gasping sensation at night.  She has nocturia, no more than once per night on average.  She does not wake up with a headache typically.  She lives with her family and husband is bothered by her snoring and often ends up sleeping in a different bedroom.  She has a TV in her bedroom and does watch it at night but does not keep it on all night.  Her sleep schedule varies.  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  Her rise time also varies depending on when she went to bed.  She does not drink caffeine daily, she does not drink alcohol or smoke.  She lives with her husband and 3 children, they are all grown, youngest is 64.  They have no pets at the house.  She does not have a family history of sleep apnea but does report a family history of snoring.  She denies difficulty breathing through her nose but wakes up with a sore throat in the mornings often.  Her Epworth sleepiness score is 3 out of 24, fatigue severity score is 9 out of 63.  REVIEW OF SYSTEMS: Out of a complete 14 system  review of symptoms, the patient complains only of the following symptoms, and all other reviewed systems are negative.   ALLERGIES: No Known Allergies  HOME MEDICATIONS: Outpatient Medications Prior to Visit  Medication Sig Dispense Refill   amLODipine (NORVASC) 10 MG tablet Take 1 tablet by mouth daily.     gabapentin (NEURONTIN) 300 MG capsule Take 1-2 capsules (300-600 mg total) by mouth at bedtime. 60 capsule 3   lisinopril (ZESTRIL) 20 MG tablet Take 1 tablet by mouth daily.     meloxicam (MOBIC) 7.5 MG tablet Take 1-2 tablets once daily as needed for pain. 30 tablet 0   pantoprazole (PROTONIX) 40 MG tablet Take 1 tablet (40 mg total) by mouth daily. 90 tablet 1   amoxicillin (AMOXIL) 875 MG tablet Take 875 mg by mouth 2 (two) times daily.     No facility-administered medications prior to visit.    PAST MEDICAL HISTORY: Past Medical History:  Diagnosis Date   Anemia    Arthritis    Hypertension    Thyroid disease     PAST SURGICAL HISTORY: Past Surgical History:  Procedure Laterality Date   APPENDECTOMY     EYE SURGERY Right 2011   lasix     FAMILY HISTORY: Family History  Problem Relation Age of Onset   Hypertension Mother    Heart disease Mother    Other Mother  blood clot   Alzheimer's disease Father    Glaucoma Father    Diabetes Sister    Hypertension Sister    Colon cancer Neg Hx    Colon polyps Neg Hx    Esophageal cancer Neg Hx    Kidney disease Neg Hx    Gallbladder disease Neg Hx    Sleep apnea Neg Hx     SOCIAL HISTORY: Social History   Socioeconomic History   Marital status: Married    Spouse name: Not on file   Number of children: 4   Years of education: Not on file   Highest education level: Not on file  Occupational History   Occupation: Interpreter  Tobacco Use   Smoking status: Never   Smokeless tobacco: Never  Vaping Use   Vaping Use: Never used  Substance and Sexual Activity   Alcohol use: No   Drug use: No    Sexual activity: Yes    Partners: Male    Birth control/protection: Post-menopausal  Other Topics Concern   Not on file  Social History Narrative   Not on file   Social Determinants of Health   Financial Resource Strain: Not on file  Food Insecurity: Not on file  Transportation Needs: Not on file  Physical Activity: Not on file  Stress: Not on file  Social Connections: Not on file  Intimate Partner Violence: Not on file      PHYSICAL EXAM  Vitals:   09/04/21 1011  BP: (!) 146/83  Pulse: 61  Weight: 144 lb 12.8 oz (65.7 kg)  Height: 5\' 1"  (1.549 m)   Body mass index is 27.36 kg/m.  Generalized: Well developed, in no acute distress  Chest: Lungs clear to auscultation bilaterally  Neurological examination  Mentation: Alert oriented to time, place, history taking. Follows all commands speech and language fluent Cranial nerve II-XII: Extraocular movements were full, visual field were full on confrontational test Head turning and shoulder shrug  were normal and symmetric. Motor: The motor testing reveals 5 over 5 strength of all 4 extremities. Good symmetric motor tone is noted throughout.  Sensory: Sensory testing is intact to soft touch on all 4 extremities. No evidence of extinction is noted.  Gait and station: Gait is normal.    DIAGNOSTIC DATA (LABS, IMAGING, TESTING) - I reviewed patient records, labs, notes, testing and imaging myself where available.  Lab Results  Component Value Date   WBC 6.5 05/15/2021   HGB 14.3 05/15/2021   HCT 44.7 05/15/2021   MCV 83.4 05/15/2021   PLT 297 05/15/2021      Component Value Date/Time   NA 139 05/15/2021 1054   NA 142 09/21/2018 1526   K 4.5 05/15/2021 1054   CL 103 05/15/2021 1054   CO2 29 05/15/2021 1054   GLUCOSE 131 (H) 05/15/2021 1054   BUN 9 05/15/2021 1054   BUN 9 09/21/2018 1526   CREATININE 0.81 05/15/2021 1054   CREATININE 0.74 08/07/2016 1640   CALCIUM 9.5 05/15/2021 1054   PROT 6.9 05/15/2021 1054    PROT 6.9 09/21/2018 1526   ALBUMIN 3.9 05/15/2021 1054   ALBUMIN 4.3 09/21/2018 1526   AST 23 05/15/2021 1054   ALT 27 05/15/2021 1054   ALKPHOS 69 05/15/2021 1054   BILITOT 0.4 05/15/2021 1054   BILITOT <0.2 09/21/2018 1526   GFRNONAA >60 05/15/2021 1054   GFRNONAA >89 08/07/2016 1640   GFRAA >60 02/03/2020 0249   GFRAA >89 08/07/2016 1640   Lab Results  Component Value  Date   CHOL 211 (H) 02/02/2020   HDL 45 02/02/2020   LDLCALC 111 (H) 02/02/2020   TRIG 277 (H) 02/02/2020   CHOLHDL 4.7 02/02/2020   Lab Results  Component Value Date   HGBA1C 5.7 (H) 02/02/2020   Lab Results  Component Value Date   VITAMINB12 497 11/03/2016   Lab Results  Component Value Date   TSH 3.036 02/02/2020      ASSESSMENT AND PLAN 60 y.o. year old female  has a past medical history of Anemia, Arthritis, Hypertension, and Thyroid disease. here with:  OSA   Patient plans to retry CPAP.  I will lower pressure 5 to 10 cm of water and I have sent an order in for mask refitting. We did discuss other treatment options such as dental device and inspire device.  For now she plans to retry CPAP. Follow-up in 4 months or sooner if needed    Butch Penny, MSN, NP-C 09/04/2021, 10:13 AM [facility] [address], [address] [place], [place] [address] [phone]

## 2021-09-04 NOTE — Progress Notes (Addendum)
CM sent to [facility] for retry cpap order. Ross Ludwig, RN got it!   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

## 2021-09-23 ENCOUNTER — Ambulatory Visit: Payer: 59 | Admitting: Internal Medicine

## 2022-01-13 ENCOUNTER — Ambulatory Visit: Payer: 59 | Admitting: Adult Health

## 2022-01-13 ENCOUNTER — Encounter: Payer: Self-pay | Admitting: Adult Health

## 2022-01-13 VITALS — BP 128/68 | HR 65 | Ht 62.0 in | Wt 143.0 lb

## 2022-01-13 DIAGNOSIS — G4733 Obstructive sleep apnea (adult) (pediatric): Secondary | ICD-10-CM

## 2022-01-13 IMAGING — MG MM DIGITAL SCREENING BILAT W/ TOMO AND CAD
6 of 10 series · 6 of 30 positions shown · non-contrast
Comparison: Previous exam(s).

CLINICAL DATA: Screening.

EXAM:
DIGITAL SCREENING BILATERAL MAMMOGRAM WITH TOMOSYNTHESIS AND CAD
TECHNIQUE: Bilateral screening digital craniocaudal and mediolateral oblique
mammograms were obtained. Bilateral screening digital breast
tomosynthesis was performed. The images were evaluated with
computer-aided detection.

[L MLO synth-2D]
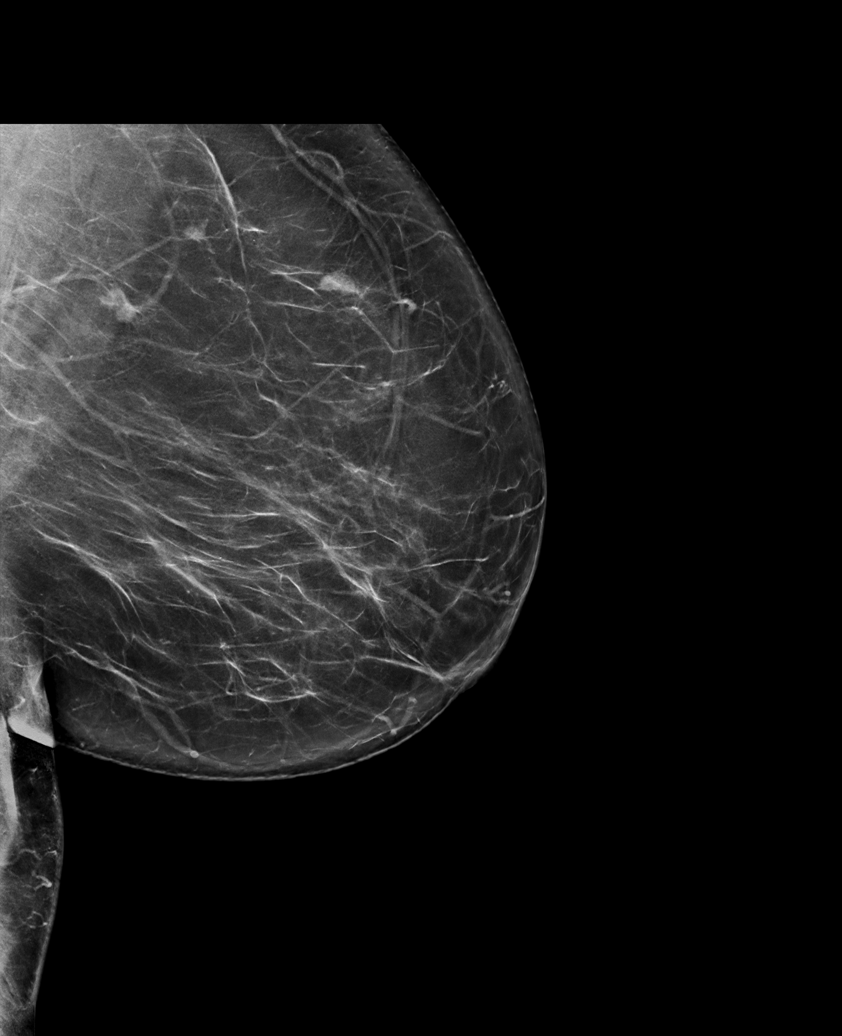

[L CC synth-2D]
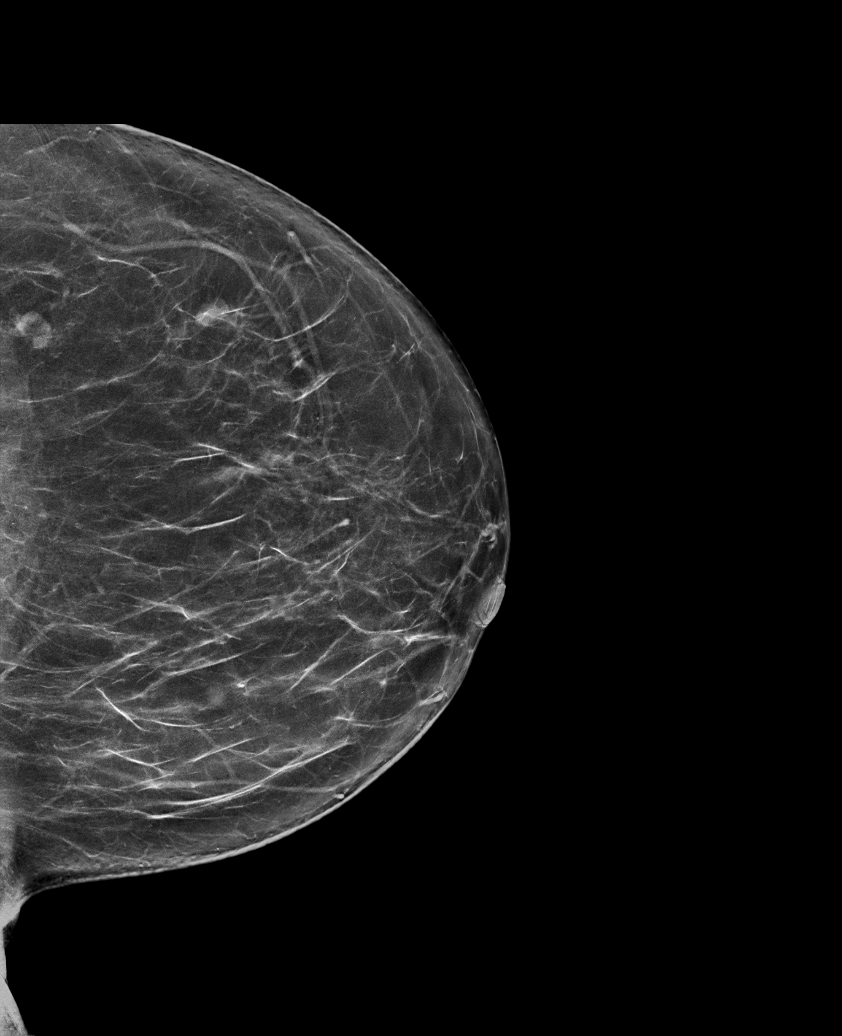

[R CC synth-2D (1 of 2)]
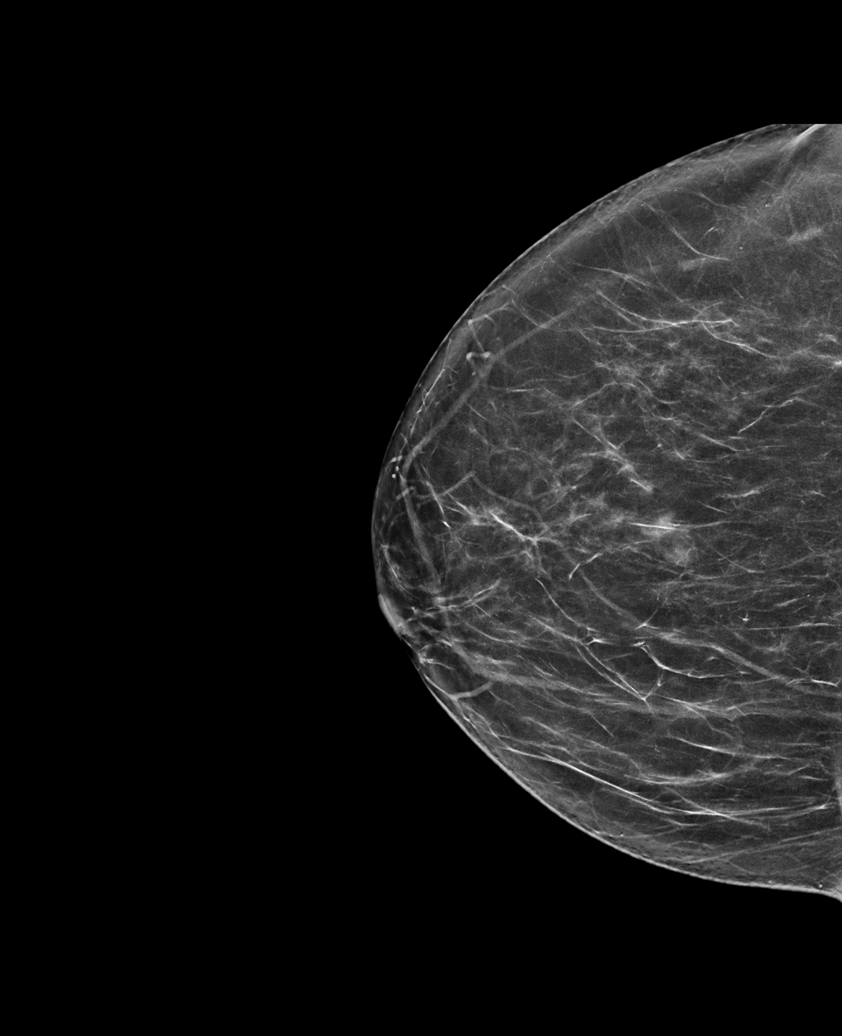

[R MLO synth-2D]
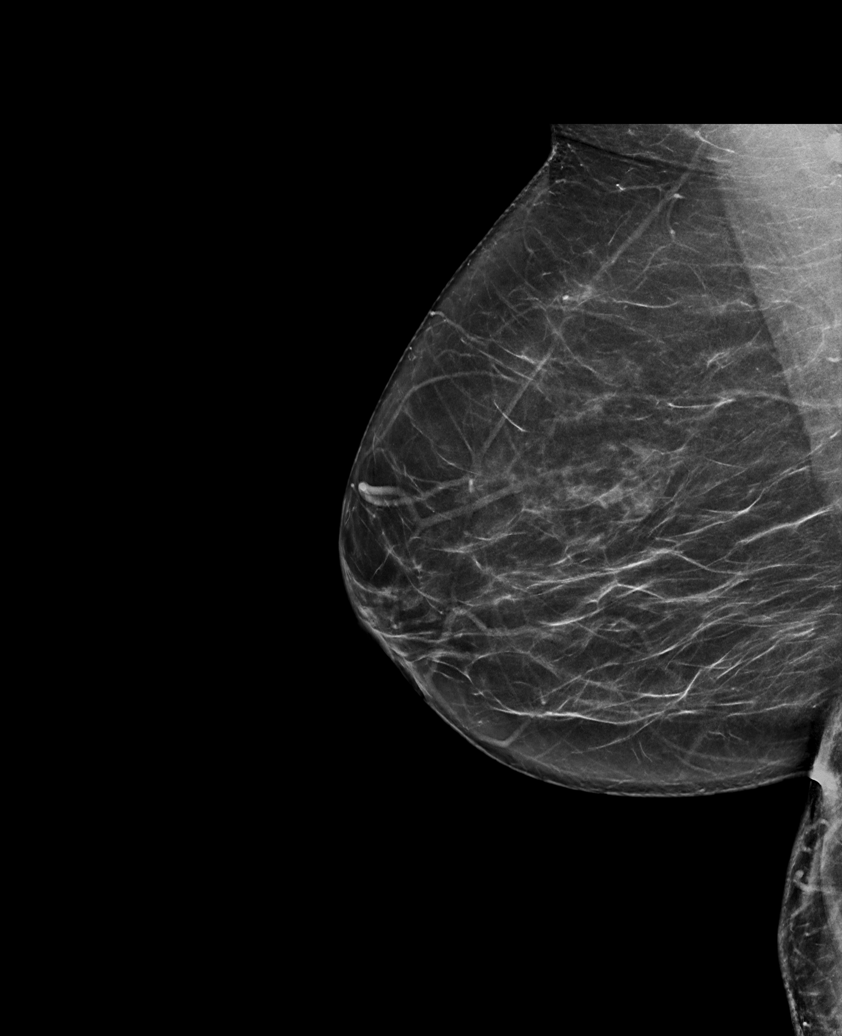

[R CC synth-2D (2 of 2)]
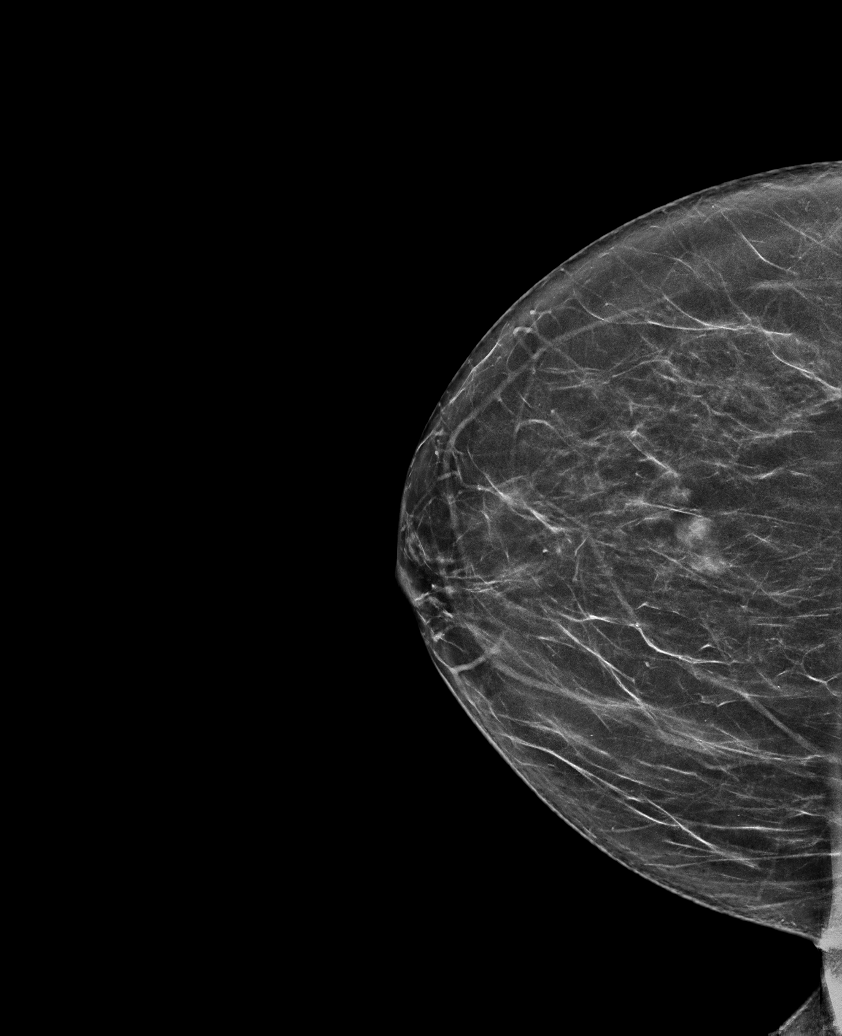

[L MLO tomo · tomo slice 41/80.0]
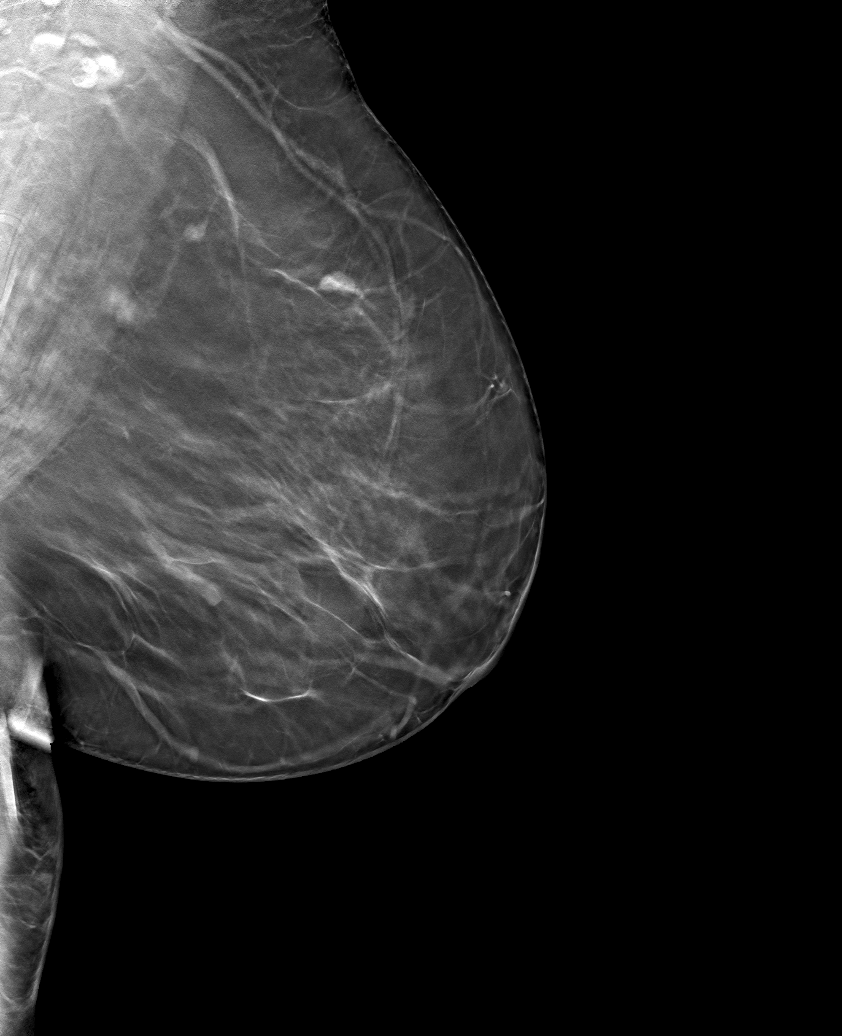

[6 of 30 positions shown; findings below may reference images not displayed]

ACR Breast Density Category b: There are scattered areas of
fibroglandular density.
FINDINGS: There are no findings suspicious for malignancy. The images were
evaluated with computer-aided detection.
IMPRESSION: No mammographic evidence of malignancy. A result letter of this
screening mammogram will be mailed directly to the patient.

RECOMMENDATION:
Screening mammogram in one year. (Code:WJ-[id number])

BI-RADS CATEGORY  1: Negative.

## 2022-01-13 NOTE — Progress Notes (Signed)
PATIENT: Pam Hart DOB: June 24, 1961  REASON FOR VISIT: follow up HISTORY FROM: patient PRIMARY NEUROLOGIST: Dr. Frances Furbish  HISTORY OF PRESENT ILLNESS: Today 01/13/22:  Pam Hart is a 61 year old female with a history of obstructive sleep apnea on CPAP.  She returns today for follow-up.  At the last visit her pressure was changed.  She reports that she used the machine for approximately 2 months.  She then had to go out [place] the [place] unexpectedly and did not take her machine with her.  She states most recently her DME company told her that they do not cover her insurance.  She will have to return the machine and start fresh with another company.  09/04/21: Pam Hart is a 61 year old female with a history of obstructive sleep apnea on CPAP.  She returns today for follow-up.  The patient states that she briefly tried to use the CPAP but felt that the pressure was too high and the mask was uncomfortable.  She also states that she developed an ear infection therefore she quit using it.  Patient returns today to discuss CPAP or other options.  HISTORY Pam Hart is a 61 year old right-handed woman with an underlying medical history of hypertension, anemia, arthritis, thyroid disease, and mildly overweight state, who reports snoring and difficulty initiating and maintaining sleep.  She denies any significant daytime somnolence or witnessed apneas or gasping sensation at night.  She has nocturia, no more than once per night on average.  She does not wake up with a headache typically.  She lives with her family and husband is bothered by her snoring and often ends up sleeping in a different bedroom.  She has a TV in her bedroom and does watch it at night but does not keep it on all night.  Her sleep schedule varies.  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  Her rise time also varies depending on when she went to bed.  She does not  drink caffeine daily, she does not drink alcohol or smoke.  She lives with her husband and 3 children, they are all grown, youngest is 25.  They have no pets at the house.  She does not have a family history of sleep apnea but does report a family history of snoring.  She denies difficulty breathing through her nose but wakes up with a sore throat in the mornings often.  Her Epworth sleepiness score is 3 out of 24, fatigue severity score is 9 out of 63.  REVIEW OF SYSTEMS: Out of a complete 14 system review of symptoms, the patient complains only of the following symptoms, and all other reviewed systems are negative.  ESS 4   ALLERGIES: No Known Allergies  HOME MEDICATIONS: Outpatient Medications Prior to Visit  Medication Sig Dispense Refill   amLODipine (NORVASC) 10 MG tablet Take 1 tablet by mouth daily.     amoxicillin (AMOXIL) 875 MG tablet Take 875 mg by mouth 2 (two) times daily.     gabapentin (NEURONTIN) 300 MG capsule Take 1-2 capsules (300-600 mg total) by mouth at bedtime. 60 capsule 3   lisinopril (ZESTRIL) 20 MG tablet Take 1 tablet by mouth daily.     meloxicam (MOBIC) 7.5 MG tablet Take 1-2 tablets once daily as needed for pain. 30 tablet 0   pantoprazole (PROTONIX) 40 MG tablet Take 1 tablet (40 mg total) by mouth daily. 90 tablet 1  No facility-administered medications prior to visit.    PAST MEDICAL HISTORY: Past Medical History:  Diagnosis Date   Anemia    Arthritis    Hypertension    Thyroid disease     PAST SURGICAL HISTORY: Past Surgical History:  Procedure Laterality Date   APPENDECTOMY     EYE SURGERY Right 2011   lasix     FAMILY HISTORY: Family History  Problem Relation Age of Onset   Hypertension Mother    Heart disease Mother    Other Mother        blood clot   Alzheimer's disease Father    Glaucoma Father    Diabetes Sister    Hypertension Sister    Colon cancer Neg Hx    Colon polyps Neg Hx    Esophageal cancer Neg Hx    Kidney  disease Neg Hx    Gallbladder disease Neg Hx    Sleep apnea Neg Hx     SOCIAL HISTORY: Social History   Socioeconomic History   Marital status: Married    Spouse name: Not on file   Number of children: 4   Years of education: Not on file   Highest education level: Not on file  Occupational History   Occupation: Interpreter  Tobacco Use   Smoking status: Never   Smokeless tobacco: Never  Vaping Use   Vaping Use: Never used  Substance and Sexual Activity   Alcohol use: No   Drug use: No   Sexual activity: Yes    Partners: Male    Birth control/protection: Post-menopausal  Other Topics Concern   Not on file  Social History Narrative   Not on file   Social Determinants of Health   Financial Resource Strain: Not on file  Food Insecurity: Not on file  Transportation Needs: Not on file  Physical Activity: Not on file  Stress: Not on file  Social Connections: Not on file  Intimate Partner Violence: Not on file      PHYSICAL EXAM  Vitals:   01/13/22 1445  BP: 128/68  Pulse: 65  Weight: 143 lb (64.9 kg)  Height: 5\' 2"  (1.575 m)    Body mass index is 26.16 kg/m.  Generalized: Well developed, in no acute distress  Chest: Lungs clear to auscultation bilaterally  Neurological examination  Mentation: Alert oriented to time, place, history taking. Follows all commands speech and language fluent Cranial nerve II-XII: Extraocular movements were full, visual field were full on confrontational test Head turning and shoulder shrug  were normal and symmetric. Motor: The motor testing reveals 5 over 5 strength of all 4 extremities. Good symmetric motor tone is noted throughout.  Sensory: Sensory testing is intact to soft touch on all 4 extremities. No evidence of extinction is noted.  Gait and station: Gait is normal.    DIAGNOSTIC DATA (LABS, IMAGING, TESTING) - I reviewed patient records, labs, notes, testing and imaging myself where available.  Lab Results   Component Value Date   WBC 6.5 05/15/2021   HGB 14.3 05/15/2021   HCT 44.7 05/15/2021   MCV 83.4 05/15/2021   PLT 297 05/15/2021      Component Value Date/Time   NA 139 05/15/2021 1054   NA 142 09/21/2018 1526   K 4.5 05/15/2021 1054   CL 103 05/15/2021 1054   CO2 29 05/15/2021 1054   GLUCOSE 131 (H) 05/15/2021 1054   BUN 9 05/15/2021 1054   BUN 9 09/21/2018 1526   CREATININE 0.81 05/15/2021 1054  CREATININE 0.74 08/07/2016 1640   CALCIUM 9.5 05/15/2021 1054   PROT 6.9 05/15/2021 1054   PROT 6.9 09/21/2018 1526   ALBUMIN 3.9 05/15/2021 1054   ALBUMIN 4.3 09/21/2018 1526   AST 23 05/15/2021 1054   ALT 27 05/15/2021 1054   ALKPHOS 69 05/15/2021 1054   BILITOT 0.4 05/15/2021 1054   BILITOT <0.2 09/21/2018 1526   GFRNONAA >60 05/15/2021 1054   GFRNONAA >89 08/07/2016 1640   GFRAA >60 02/03/2020 0249   GFRAA >89 08/07/2016 1640   Lab Results  Component Value Date   CHOL 211 (H) 02/02/2020   HDL 45 02/02/2020   LDLCALC 111 (H) 02/02/2020   TRIG 277 (H) 02/02/2020   CHOLHDL 4.7 02/02/2020   Lab Results  Component Value Date   HGBA1C 5.7 (H) 02/02/2020   Lab Results  Component Value Date   VITAMINB12 497 11/03/2016   Lab Results  Component Value Date   TSH 3.036 02/02/2020      ASSESSMENT AND PLAN 61 y.o. year old female  has a past medical history of Anemia, Arthritis, Hypertension, and Thyroid disease. here with:  [facility]   Order will be sent to new DME company When patient gets new CPAP she is encouraged to use it nightly and greater than 4 hours each night Follow-up in 6 months or sooner if needed    Butch Penny, MSN, NP-C 01/13/2022, 2:44 PM [facility] [address] [place], [place] [address] [phone]

## 2022-01-16 ENCOUNTER — Telehealth: Payer: Self-pay | Admitting: Adult Health

## 2022-01-16 NOTE — Telephone Encounter (Signed)
Pt has called to report that with her new insurance 2 DME's that are accepted for CPAP supplies are [facility] and [facility], pt is asking for a RN to call her to discuss. ?

## 2022-01-16 NOTE — Telephone Encounter (Signed)
I called pt and she would like to [facility] for her new machine.  She is to turn in her machine that she got from [facility] and then to proceed with [facility] who is in network.  Pt verbalized understanding.  I sent message to [facility] re: new user.  ?

## 2022-01-21 ENCOUNTER — Encounter (HOSPITAL_COMMUNITY): Payer: Self-pay

## 2022-01-21 ENCOUNTER — Ambulatory Visit (HOSPITAL_COMMUNITY)
Admission: RE | Admit: 2022-01-21 | Discharge: 2022-01-21 | Disposition: A | Discharge status: Home / Self Care | Payer: 59 | Source: Ambulatory Visit | Attending: Family Medicine | Admitting: Family Medicine

## 2022-01-21 ENCOUNTER — Other Ambulatory Visit: Payer: Self-pay

## 2022-01-21 VITALS — BP 143/82 | HR 68 | Temp 98.0°F | Resp 18

## 2022-01-21 DIAGNOSIS — M542 Cervicalgia: Secondary | ICD-10-CM

## 2022-01-21 DIAGNOSIS — R0789 Other chest pain: Secondary | ICD-10-CM

## 2022-01-21 DIAGNOSIS — K219 Gastro-esophageal reflux disease without esophagitis: Secondary | ICD-10-CM

## 2022-01-21 MED ORDER — PANTOPRAZOLE SODIUM 40 MG PO TBEC: 40.0000 mg | DELAYED_RELEASE_TABLET | Freq: Every day | ORAL | 0 refills | Status: AC

## 2022-01-21 MED ORDER — MELOXICAM 7.5 MG PO TABS: 7.5000 mg | ORAL_TABLET | Freq: Every day | ORAL | 0 refills | Status: DC

## 2022-01-21 MED ORDER — TRAMADOL HCL 50 MG PO TABS: 50.0000 mg | ORAL_TABLET | Freq: Four times a day (QID) | ORAL | 0 refills | Status: AC | PRN

## 2022-01-21 NOTE — ED Provider Notes (Signed)
?[facility] ? ? ? ?CSN: [id number] ?Arrival date & time: 01/21/22  1011 ? ? ?  ? ?History   ?Chief Complaint ?Chief Complaint  ?Patient presents with  ? Appointment  ?  10:00  ? Chest Pain  ? ? ?HPI ?Pam Hart is a 61 y.o. female.  ? ?Patient reports 41-month history of chest pain.  She reports the pain is all the way across her chest.  She reports the pain last for a few minutes, then goes away, however recurs sometimes hours later.  Previously, she was told this was muscular pain and she took muscle relaxant and tramadol with some benefit.  She describes the pain as sharp and goes across her upper chest from the left to the right side.  The pain is not related to exertion.  She does feel more anxious when the pain comes on, does have some shortness of breath, and nausea.  She denies cough, sweating, and palpitations.  She does have a history of heartburn and has recently run out of her medication.  She reports her mother has had heart disease. ? ? ? ? ? ?Past Medical History:  ?Diagnosis Date  ? Anemia   ? Arthritis   ? Hypertension   ? Thyroid disease   ? ? ?Patient Active Problem List  ? Diagnosis Date Noted  ? Sinus bradycardia 02/03/2020  ? Pre-syncope 02/02/2020  ? Right elbow pain 01/05/2017  ? Right hand pain 01/05/2017  ? Thyroid nodule 07/29/2016  ? Neck pain 10/29/2015  ? Hypertension, uncontrolled 08/22/2015  ? Chest pain with moderate risk of acute coronary syndrome 08/22/2015  ? GERD (gastroesophageal reflux disease) 08/22/2015  ? Hyperlipidemia 03/17/2013  ? FATIGUE 08/05/2007  ? ? ?Past Surgical History:  ?Procedure Laterality Date  ? APPENDECTOMY    ? EYE SURGERY Right 2011  ? lasix   ? ? ?OB History   ? ? Gravida  ?5  ? Para  ?   ? Term  ?   ? Preterm  ?   ? AB  ?1  ? Living  ?3  ?  ? ? SAB  ?1  ? IAB  ?   ? Ectopic  ?   ? Multiple  ?   ? Live Births  ?4  ?   ?  ?  ? ? ? ?Home Medications   ? ?Prior to Admission medications   ?Medication Sig Start Date End Date Taking? Authorizing  Provider  ?traMADol (ULTRAM) 50 MG tablet Take 1 tablet (50 mg total) by mouth every 6 (six) hours as needed for up to 3 days for severe pain. 01/21/22 01/24/22 Yes Valentino Nose, NP  ?amLODipine (NORVASC) 10 MG tablet Take 1 tablet by mouth daily. 01/16/21   [provider]  ?amoxicillin (AMOXIL) 875 MG tablet Take 875 mg by mouth 2 (two) times daily. ?Patient not taking: Reported on 01/21/2022 08/30/21   [provider]  ?gabapentin (NEURONTIN) 300 MG capsule Take 1-2 capsules (300-600 mg total) by mouth at bedtime. 01/30/21   Just, Azalee Course, FNP  ?lisinopril (ZESTRIL) 20 MG tablet Take 1 tablet by mouth daily. 01/16/21   [provider]  ?meloxicam (MOBIC) 7.5 MG tablet Take 1 tablet (7.5 mg total) by mouth daily. Take 1-2 tablets once daily as needed for pain. 01/21/22   Valentino Nose, NP  ?pantoprazole (PROTONIX) 40 MG tablet Take 1 tablet (40 mg total) by mouth daily. Take 30 minutes prior to first meal 01/21/22  Valentino Nose, NP  ? ? ?Family History ?Family History  ?Problem Relation Age of Onset  ? Hypertension Mother   ? Heart disease Mother   ? Other Mother   ?     blood clot  ? Alzheimer's disease Father   ? Glaucoma Father   ? Diabetes Sister   ? Hypertension Sister   ? Colon cancer Neg Hx   ? Colon polyps Neg Hx   ? Esophageal cancer Neg Hx   ? Kidney disease Neg Hx   ? Gallbladder disease Neg Hx   ? Sleep apnea Neg Hx   ? ? ?Social History ?Social History  ? ?Tobacco Use  ? Smoking status: Never  ? Smokeless tobacco: Never  ?Vaping Use  ? Vaping Use: Never used  ?Substance Use Topics  ? Alcohol use: No  ? Drug use: No  ? ? ? ?Allergies   ?Patient has no known allergies. ? ? ?Review of Systems ?Review of Systems ?Per HPI ? ?Physical Exam ?Triage Vital Signs ?ED Triage Vitals  ?Enc Vitals Group  ?   BP 01/21/22 1050 (!) 143/82  ?   Pulse Rate 01/21/22 1050 68  ?   Resp 01/21/22 1050 18  ?   Temp 01/21/22 1050 98 ?F (36.7 ?C)  ?   Temp Source 01/21/22 1050 Oral  ?   SpO2  01/21/22 1050 98 %  ?   Weight --   ?   Height --   ?   Head Circumference --   ?   Peak Flow --   ?   Pain Score 01/21/22 1047 6  ?   Pain Loc --   ?   Pain Edu? --   ?   Excl. in GC? --   ? ?No data found. ? ?Updated Vital Signs ?BP (!) 143/82 (BP Location: Right Arm)   Pulse 68   Temp 98 ?F (36.7 ?C) (Oral)   Resp 18   SpO2 98%  ? ?Visual Acuity ?Right Eye Distance:   ?Left Eye Distance:   ?Bilateral Distance:   ? ?Right Eye Near:   ?Left Eye Near:    ?Bilateral Near:    ? ?Physical Exam ?Vitals and nursing note reviewed.  ?Constitutional:   ?   General: She is not in acute distress. ?   Appearance: She is well-developed. She is not toxic-appearing.  ?HENT:  ?   Head: Normocephalic and atraumatic.  ?Eyes:  ?   Extraocular Movements: Extraocular movements intact.  ?   Pupils: Pupils are equal, round, and reactive to light.  ?Cardiovascular:  ?   Rate and Rhythm: Regular rhythm. Bradycardia present.  ?   Heart sounds: Normal heart sounds. No murmur heard. ?Pulmonary:  ?   Effort: Pulmonary effort is normal. No tachypnea or respiratory distress.  ?Chest:  ?   Chest wall: No mass, deformity, tenderness, crepitus or edema. There is no dullness to percussion.  ? ? ?   Comments: Describes pain as across her chest; nontender to palpation and no chest pain today ?Musculoskeletal:  ?   Cervical back: Normal range of motion.  ?Skin: ?   General: Skin is warm and dry.  ?   Coloration: Skin is not cyanotic or pale.  ?   Findings: No erythema.  ?Neurological:  ?   General: No focal deficit present.  ?   Mental Status: She is alert and oriented to person, place, and time.  ?Psychiatric:     ?   Mood  and Affect: Mood normal.     ?   Behavior: Behavior normal.  ? ? ? ?UC Treatments / Results  ?Labs ?(all labs ordered are listed, but only abnormal results are displayed) ?Labs Reviewed - No data to display ? ?EKG ? ? ?Radiology ?No results found. ? ?Procedures ?Procedures (including critical care time) ? ?Medications Ordered in  UC ?Medications - No data to display ? ?Initial Impression / Assessment and Plan / UC Course  ?I have reviewed the triage vital signs and the nursing notes. ? ?Pertinent labs & imaging results that were available during my care of the patient were reviewed by me and considered in my medical decision making (see chart for details). ? ?  ?EKG today is unchanged when compared with previous and continues to show sinus bradycardia.  Her pain is not reproducible on examination today.  Will send in refill of cyclobenzaprine and tramadol to help with pain.  Will also send in refill of pantoprazole to help with acid reflux symptoms.  Differentials remains wide and could include acid reflux, anxiety, cardiac etiology.  Given family history of heart disease, I encouraged the patient to establish care with a Cardiologist to have advanced imaging if indicated.  I encouraged her to seek emergent care if her pain recurs in the meantime. ?Final Clinical Impressions(s) / UC Diagnoses  ? ?Final diagnoses:  ?Atypical chest pain  ? ? ? ?Discharge Instructions   ? ?  ?Please call the number provided to get an appointment with a Cardiologist.  If your pain returns, please seek emergent care.  Please resume the pantoporazole daily on an empty stomach to help with acid reflux.  You can use the meloxicam and tramadol to help with pain in the meantime.   ? ? ? ? ?ED Prescriptions   ? ? Medication Sig Dispense Auth. Provider  ? meloxicam (MOBIC) 7.5 MG tablet Take 1 tablet (7.5 mg total) by mouth daily. Take 1-2 tablets once daily as needed for pain. 30 tablet Cathlean Marseilles A, NP  ? pantoprazole (PROTONIX) 40 MG tablet Take 1 tablet (40 mg total) by mouth daily. Take 30 minutes prior to first meal 30 tablet Cathlean Marseilles A, NP  ? traMADol (ULTRAM) 50 MG tablet Take 1 tablet (50 mg total) by mouth every 6 (six) hours as needed for up to 3 days for severe pain. 15 tablet Valentino Nose, NP  ? ?  ? ?I have reviewed the PDMP during  this encounter. ?  ?Valentino Nose, NP ?01/21/22 1147 ? ?

## 2022-01-21 NOTE — ED Triage Notes (Signed)
Intermittent chest pain that started 2 months ago.  Saw a provider and was told it was muscular and given muscle relaxer and it did help.  Non radiating pain in chest.  Describes pain as "discomfort".  Denies diaphoresis.  Intermittently has nausea, fatigue ?

## 2022-01-21 NOTE — Discharge Instructions (Addendum)
Please call the number provided to get an appointment with a Cardiologist.  If your pain returns, please seek emergent care.  Please resume the pantoporazole daily on an empty stomach to help with acid reflux.  You can use the meloxicam and tramadol to help with pain in the meantime.   ?

## 2022-01-27 NOTE — Telephone Encounter (Signed)
Coltrane, Dickie La, RN; Castlewood, Ashly; Glyndon, Virgie ?PRINTED  ? ?THANKS   ? ?  ?Previous Messages ?  ?----- Message -----  ?From: Guy Begin, RN  ?Sent: 01/16/2022   2:06 PM EST  ?To: Rosetta Posner, *  ?Subject: new machine                                    ? ?Good Afternoon,  ? ?New user orders in Epic.  You are in network with her plan.  ? ?Latanja M. Freilich  ?Female, 61 y.o., [birth year]  ?MRN:  ?[id number]  ?Phone:  ?[phone]  ? ?Sandy RN   ? ?

## 2022-04-10 ENCOUNTER — Ambulatory Visit (INDEPENDENT_AMBULATORY_CARE_PROVIDER_SITE_OTHER): Payer: 59

## 2022-04-10 ENCOUNTER — Encounter: Payer: Self-pay | Admitting: Orthopaedic Surgery

## 2022-04-10 ENCOUNTER — Ambulatory Visit (INDEPENDENT_AMBULATORY_CARE_PROVIDER_SITE_OTHER): Payer: 59 | Admitting: Orthopaedic Surgery

## 2022-04-10 DIAGNOSIS — M546 Pain in thoracic spine: Secondary | ICD-10-CM

## 2022-04-10 DIAGNOSIS — M545 Low back pain, unspecified: Secondary | ICD-10-CM

## 2022-04-10 DIAGNOSIS — G8929 Other chronic pain: Secondary | ICD-10-CM | POA: Diagnosis not present

## 2022-04-10 DIAGNOSIS — M542 Cervicalgia: Secondary | ICD-10-CM

## 2022-04-10 MED ORDER — PREDNISONE 10 MG (21) PO TBPK: ORAL_TABLET | ORAL | 0 refills | Status: DC

## 2022-04-10 MED ORDER — METHOCARBAMOL 500 MG PO TABS: 500.0000 mg | ORAL_TABLET | Freq: Two times a day (BID) | ORAL | 2 refills | Status: AC | PRN
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
## 2022-04-24 ENCOUNTER — Ambulatory Visit: Payer: 59 | Attending: Physician Assistant | Admitting: Physical Therapy

## 2022-04-24 ENCOUNTER — Encounter: Payer: Self-pay | Admitting: Physical Therapy

## 2022-04-24 DIAGNOSIS — M542 Cervicalgia: Secondary | ICD-10-CM | POA: Diagnosis present

## 2022-04-24 DIAGNOSIS — M546 Pain in thoracic spine: Secondary | ICD-10-CM | POA: Insufficient documentation

## 2022-04-24 DIAGNOSIS — G8929 Other chronic pain: Secondary | ICD-10-CM | POA: Insufficient documentation

## 2022-04-24 DIAGNOSIS — M6281 Muscle weakness (generalized): Secondary | ICD-10-CM | POA: Insufficient documentation

## 2022-04-24 DIAGNOSIS — M545 Low back pain, unspecified: Secondary | ICD-10-CM | POA: Diagnosis present

## 2022-04-29 ENCOUNTER — Encounter: Payer: Self-pay | Admitting: Physical Therapy

## 2022-04-29 ENCOUNTER — Ambulatory Visit: Payer: 59 | Admitting: Physical Therapy

## 2022-04-29 DIAGNOSIS — M545 Low back pain, unspecified: Secondary | ICD-10-CM

## 2022-04-29 DIAGNOSIS — M6281 Muscle weakness (generalized): Secondary | ICD-10-CM

## 2022-04-29 DIAGNOSIS — M542 Cervicalgia: Secondary | ICD-10-CM

## 2022-04-29 DIAGNOSIS — M546 Pain in thoracic spine: Secondary | ICD-10-CM

## 2022-04-29 NOTE — Therapy (Signed)
OUTPATIENT PHYSICAL THERAPY TREATMENT NOTE   Patient Name: Pam Hart MRN: [id number] DOB:September 12, 1961, 61 y.o., female Today's Date: 04/29/2022  PCP: none REFERRING PROVIDER: Cristie Hem, PA-C   PT End of Session - 04/29/22 1718     Visit Number 2    Number of Visits --   1-2x/week   Authorization Type [employer]    Authorization - Number of Visits 30    PT Start Time 1718   pt arrived late   PT Stop Time 1742    PT Time Calculation (min) 24 min             Past Medical History:  Diagnosis Date   Anemia    Arthritis    Hypertension    Thyroid disease    Past Surgical History:  Procedure Laterality Date   APPENDECTOMY     EYE SURGERY Right 2011   lasix    Patient Active Problem List   Diagnosis Date Noted   Sinus bradycardia 02/03/2020   Pre-syncope 02/02/2020   Right elbow pain 01/05/2017   Right hand pain 01/05/2017   Thyroid nodule 07/29/2016   Neck pain 10/29/2015   Hypertension, uncontrolled 08/22/2015   Chest pain with moderate risk of acute coronary syndrome 08/22/2015   GERD (gastroesophageal reflux disease) 08/22/2015   Hyperlipidemia 03/17/2013   FATIGUE 08/05/2007    THERAPY DIAG:  Muscle weakness  Cervicalgia  Low back pain, unspecified back pain laterality, unspecified chronicity, unspecified whether sciatica present  Pain in thoracic spine  REFERRING DIAG: Neck pain [M54.2], Pain in thoracic spine [M54.6], Chronic midline low back pain, unspecified whether sciatica present [M54.50, G89.29]  PERTINENT HISTORY: Hx of multi-joint pain (appears (-) for rheumatic disease)  PRECAUTIONS/RESTRICTIONS:   none  SUBJECTIVE:  Pt reports that she feels about the same.  Her neck is hurting her more today.  Pain:  Are you having pain? Yes Pain location: neck, thoracic, low back NPRS scale:  current 8/10  average 9/10  Aggravating factors: activity Relieving factors: rest, pain medication Pain description: constant and  aching Stage: Chronic 24 hour pattern: better in the morning, worse with activity   OBJECTIVE:   DIAGNOSTIC FINDINGS:  X-Ray Cx spine - moderate degenerative changes Lx spine - mild lower degenerative changes   GENERAL OBSERVATION/GAIT:           Slow gait   SENSATION:          Light touch: Appears intact on exam, subjective sensation loss in hands and feet at times.   MUSCLE LENGTH: Hamstrings: Right moderate restriction; Left moderate restriction ASLR: Right ASLR = PSLR; Left ASLR = PSLR Thomas test: Right minimal restriction; Left minimal restriction   Cervical ROM   ROM ROM  04/25/2022  Flexion 60  Extension 70*  Right lateral flexion 40*  Left lateral flexion 40*  Right rotation 70*  Left rotation 70*  Flexion rotation (normal is 30 degrees)    Flexion rotation (normal is 30 degrees)      (Blank rows = not tested, N = WNL, * = concordant pain)     LUMBAR AROM   AROM AROM  04/25/2022  Flexion WNL, w/ concordant pain  Extension WNL, w/ concordant pain  Right lateral flexion WNL, w/ concordant pain  Left lateral flexion WNL, w/ concordant pain  Right rotation WNL, w/ concordant pain  Left rotation WNL, w/ concordant pain    (Blank rows = not tested)   DIRECTIONAL PREFERENCE:  None clear   LE MMT:   MMT Right 04/25/2022 Left 04/25/2022  Hip flexion (L2, L3) c c  Knee extension (L3) c c  Knee flexion      Hip abduction      Hip extension      Hip external rotation      Hip internal rotation      Hip adduction      Ankle dorsiflexion (L4) c c  Ankle plantarflexion (S1) c c  Ankle inversion      Ankle eversion      Great Toe ext (L5) c c  Grossly 3+    (Blank rows = not tested, score listed is out of 5 possible points.  N = WNL, D = diminished, C = clear for gross weakness with myotome testing, * = concordant pain with testing)   UPPER EXTREMITY MMT:   MMT Right 04/25/2022 Left 04/25/2022  Shoulder flexion      Shoulder abduction (C5) c c   Shoulder ER      Shoulder IR      Middle trapezius      Lower trapezius      Shoulder extension      Grip strength      Cervical flexion (C1,C2)      Cervical S/B (C3)      Shoulder shrug (C4) c c  Elbow flexion (C6) c c  Elbow ext (C7) c c  Thumb ext (C8) c c  Finger abd (T1) c c  Grossly 4 4    (Blank rows = not tested, score listed is out of 5 possible points.  N = WNL, D = diminished, C = clear for gross weakness with myotome testing, * = concordant pain with testing)     Functional Tests   Eval (04/25/2022)      Sustained supine bridge (dominant leg extended at 120'', if reached): 10'' (norm 170'')                                                                                                      LUMBAR SPECIAL TESTS:  Straight leg raise: L (-), R (-) Slump: L (-), R (-) Babinski, clonus, hoffmans (-) Cervical radiculopathy cluster (-)    PALPATION:            TTP bil UT, cervical paraspinals    SPINAL SEGMENTAL MOBILITY ASSESSMENT:  Hypomobile throughout Cx spine and lumbar spine   PATIENT SURVEYS:  Modified Oswestry Low Back Pain Disability Questionnaire: 23 / 50 = 46.0 %     TODAY'S TREATMENT  Creating, reviewing, and completing below HEP   PATIENT EDUCATION:  POC, diagnosis, prognosis, HEP, and outcome measures.  Pt educated via explanation, demonstration, and handout (HEP).  Pt confirms understanding verbally.    HOME EXERCISE PROGRAM: Access Code: [email or web address] URL: [email or web address] Date: 04/25/2022 Prepared by: Alphonzo Severance   Exercises - Supine Lower Trunk Rotation  - 1 x daily - 7 x weekly - 1 sets - 20 reps - 3 hold - Supine Posterior Pelvic Tilt  - 2 x daily - 7  x weekly - 2 sets - 10 reps - 5'' hold - Seated Isometric Cervical Sidebending  - 2 x daily - 7 x weekly - 10 reps - 10 second hold - Seated Isometric Cervical Extension  - 2 x daily - 7 x weekly - 10 reps - 10 second hold - Seated Isometric Cervical  Flexion  - 2 x daily - 7 x weekly - 10 reps - 10 second hold   ASTERISK SIGNS     Asterisk Signs Eval (04/25/2022)            pain 9/10 max, 8/10 average            Supine bridge  10'' with pain             LE MMT                                                TREATMENT 6/13:  Therapeutic Exercise: - LTR - 20x - bridge - small arc - 3x10 - alternating clam - blue TB - 3x10 - supine hip adduction ball squeeze - 3x10  Manual Therapy: - STM, bil UT - pin and stretch, bil UT - PA mob C6-TI, pt in supine, G III   ASSESSMENT:   CLINICAL IMPRESSION: Pam Hart tolerated session well.  Pt arrived late so visit was truncated.  Pt responded well to manual therapy with significant reduction in cervical pain.  She completed mat lumbar exercises with no increase in pain.     OBJECTIVE IMPAIRMENTS: Pain, LE/Core/Hip weakness   ACTIVITY LIMITATIONS: walking, standing, lifting, work   PERSONAL FACTORS: See medical history and pertinent history     REHAB POTENTIAL: Fair chronic condition and global pain   CLINICAL DECISION MAKING: Stable/uncomplicated   EVALUATION COMPLEXITY: Low     GOALS:     SHORT TERM GOALS: Target date: 05/16/2022   Pam Hart will be >75% HEP compliant to improve carryover between sessions and facilitate independent management of condition   Evaluation (04/25/2022): ongoing Goal status: INITIAL     LONG TERM GOALS: Target date: 06/20/2022   Pam Hart will show a >/= 12 pt improvement in their ODI score (MCID is 12 pts) as a proxy for functional improvement    Evaluation/Baseline (04/25/2022): Modified Oswestry Low Back Pain Disability Questionnaire: 23 / 50 = 46.0 % Goal status: INITIAL     2.  Pam Hart will self report >/= 50% decrease in pain from evaluation    Evaluation/Baseline (04/25/2022): 9/10 max pain, 8/10 average Goal status: INITIAL     3.  Pam Hart will report confidence in self management of condition at time of discharge with advanced HEP    Evaluation/Baseline (04/25/2022): unable to self manage Goal status: INITIAL     4.  Pam Hart will be able to maintain supine bridge for 40'' (dominant leg extended if 120'' reached) as evidence of improved hip extension and core strength (norm for healthy adult ~170'')    Evaluation/Baseline (04/25/2022): 10'' Goal status: INITIAL     PLAN: PT FREQUENCY: 1-2x/week   PT DURATION: 8 weeks (Ending 06/20/2022)   PLANNED INTERVENTIONS: Therapeutic exercises, Aquatic therapy, Therapeutic activity, Neuro Muscular re-education, Gait training, Patient/Family education, Joint mobilization, Dry Needling, Electrical stimulation, Spinal mobilization and/or manipulation, Moist heat, Taping, Vasopneumatic device, Ionotophoresis 4mg /ml Dexamethasone, and Manual therapy   PLAN FOR NEXT SESSION: global progressive strengthening and graded exposure,  progressive hip/core/LE strengthening   Kimberlee Nearing Jeniece Hannis PT 04/29/2022, 5:46 PM

## 2022-05-01 ENCOUNTER — Other Ambulatory Visit: Payer: Self-pay

## 2022-05-01 ENCOUNTER — Ambulatory Visit: Payer: 59 | Admitting: Physical Therapy

## 2022-05-01 ENCOUNTER — Encounter: Payer: Self-pay | Admitting: Physical Therapy

## 2022-05-01 DIAGNOSIS — M542 Cervicalgia: Secondary | ICD-10-CM

## 2022-05-01 DIAGNOSIS — M546 Pain in thoracic spine: Secondary | ICD-10-CM

## 2022-05-01 DIAGNOSIS — M545 Low back pain, unspecified: Secondary | ICD-10-CM

## 2022-05-01 DIAGNOSIS — M6281 Muscle weakness (generalized): Secondary | ICD-10-CM | POA: Diagnosis not present

## 2022-05-01 IMAGING — US US ABDOMEN LIMITED
1 series · 14 of 25 positions shown · non-contrast
Comparison: None.

CLINICAL DATA: Two-month history of right upper quadrant pain

EXAM:
ULTRASOUND ABDOMEN LIMITED RIGHT UPPER QUADRANT

[Series 1: us abdomen limited · 0.25mm/px · 14 of 39 slices shown]
[im 1/39]
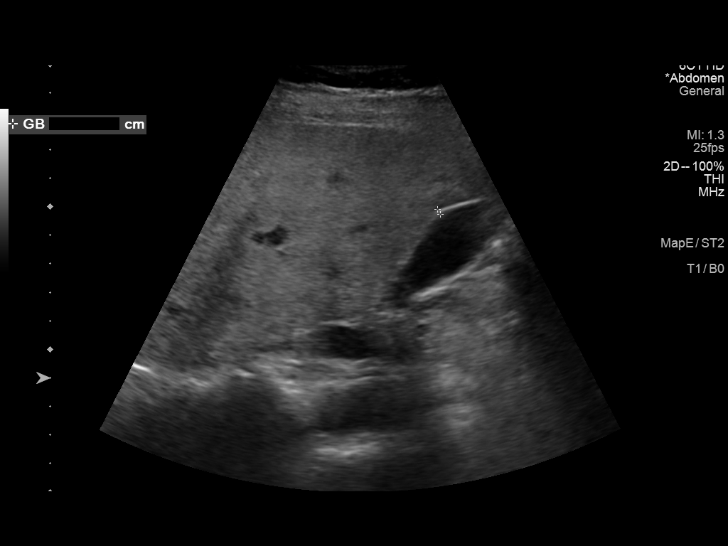
[im 4/39]
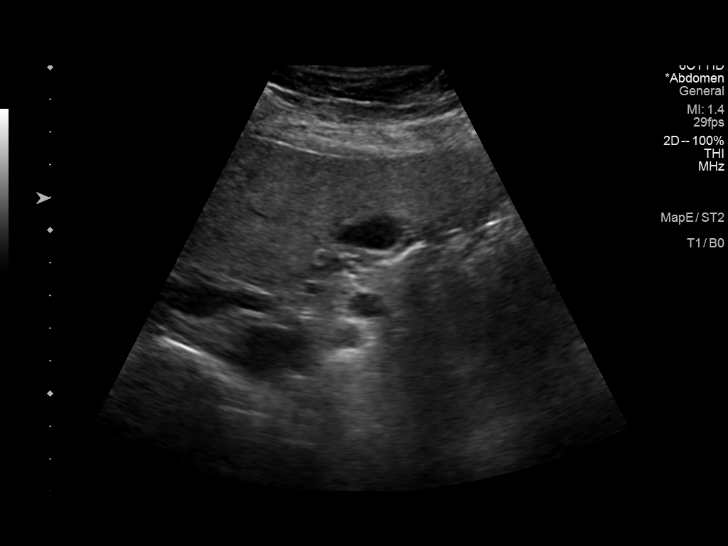
[im 7/39]
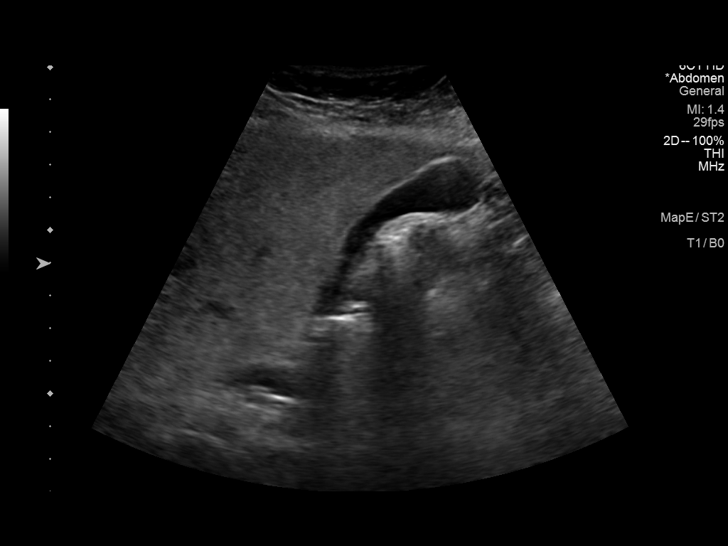
[im 10/39]
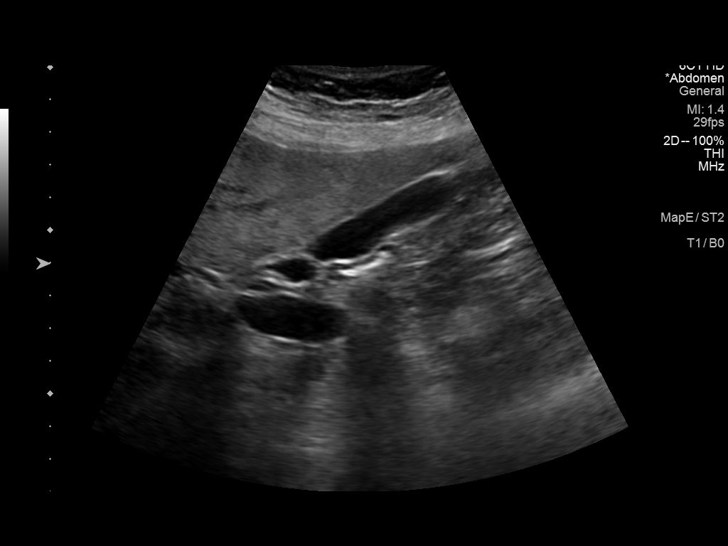
[im 13/39]
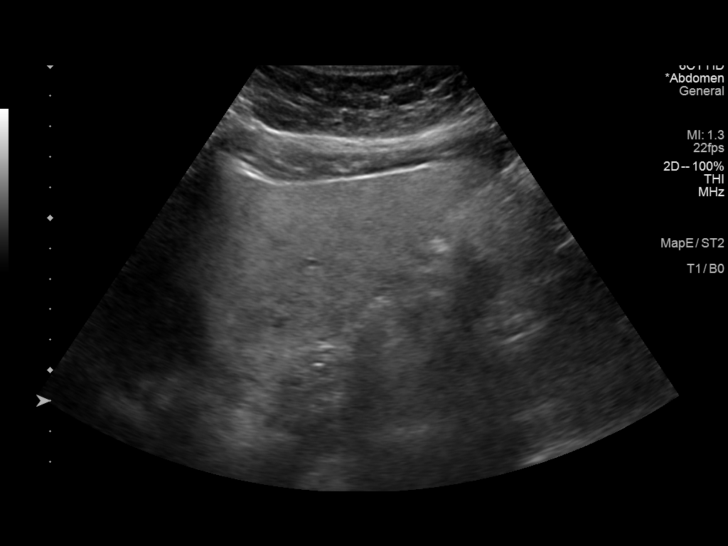
[im 15/39]
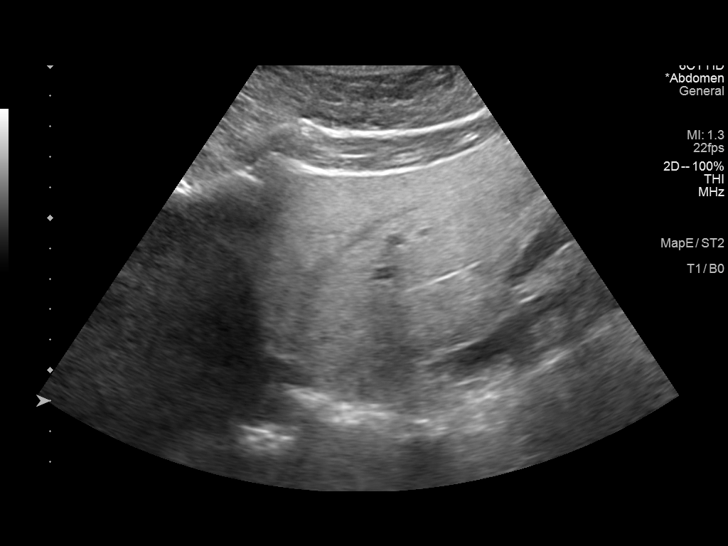
[im 18/39]
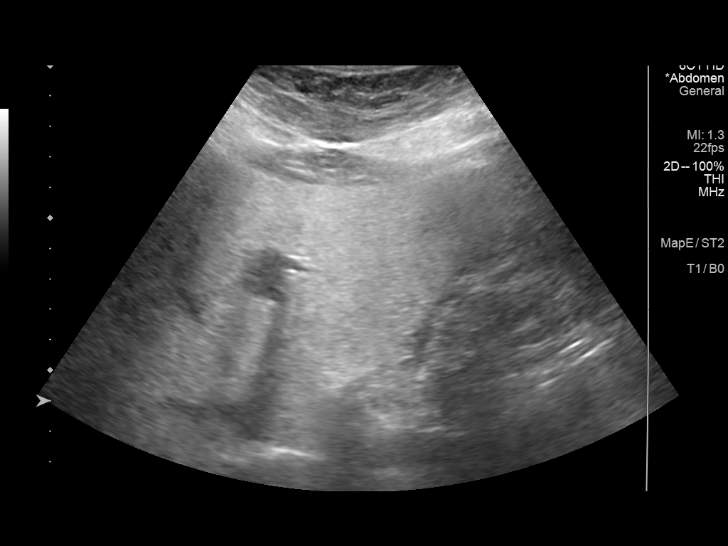
[im 21/39]
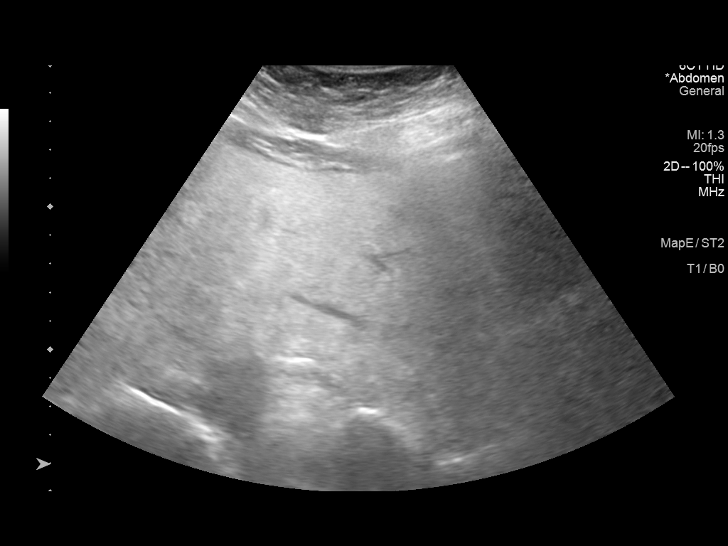
[im 24/39]
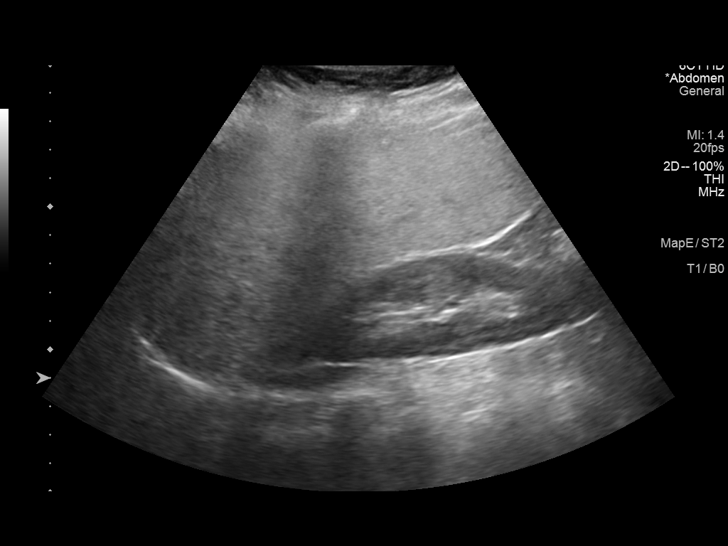
[im 26/39]
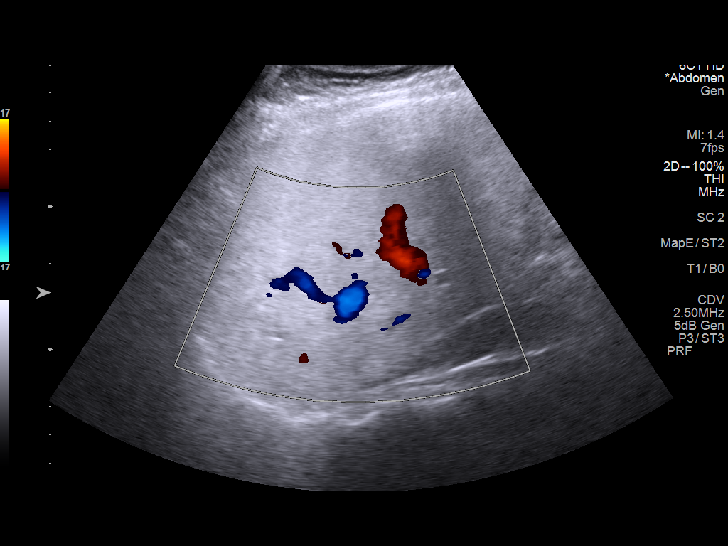
[im 29/39]
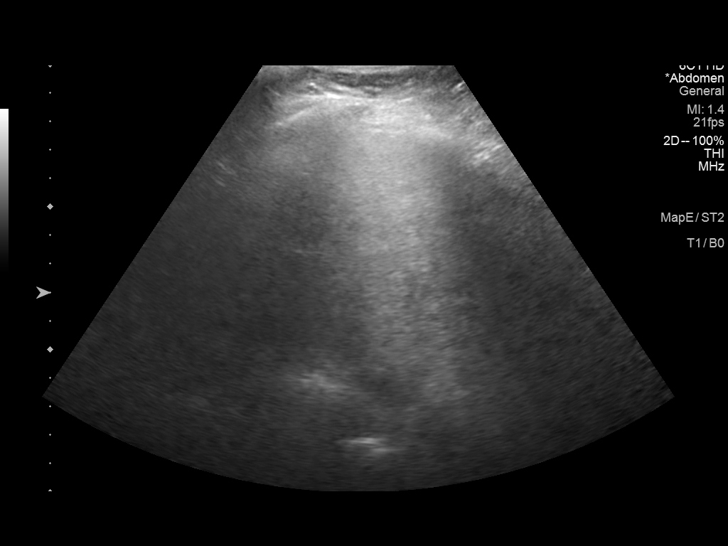
[im 32/39]
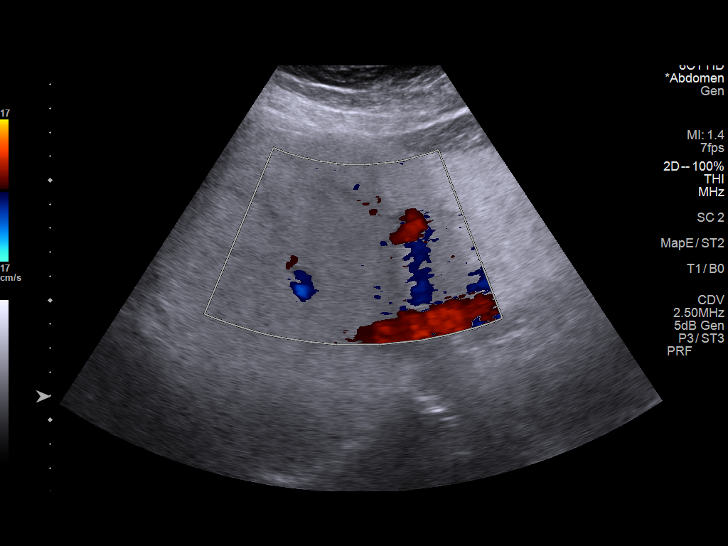
[im 35/39]
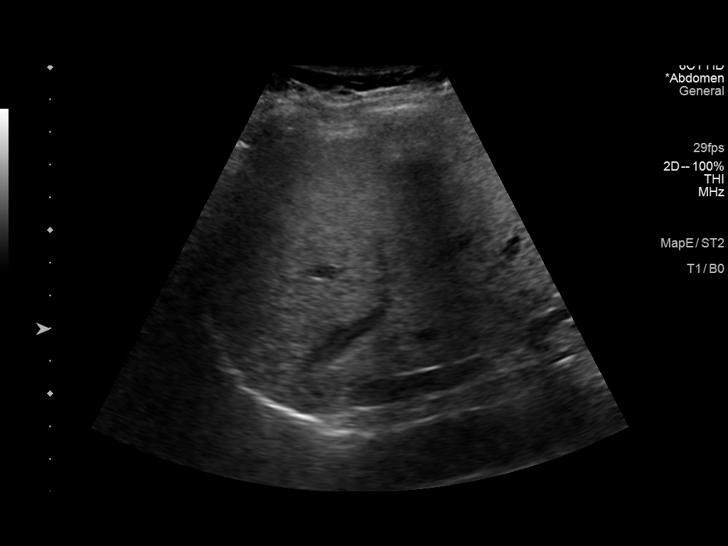
[im 39/39]
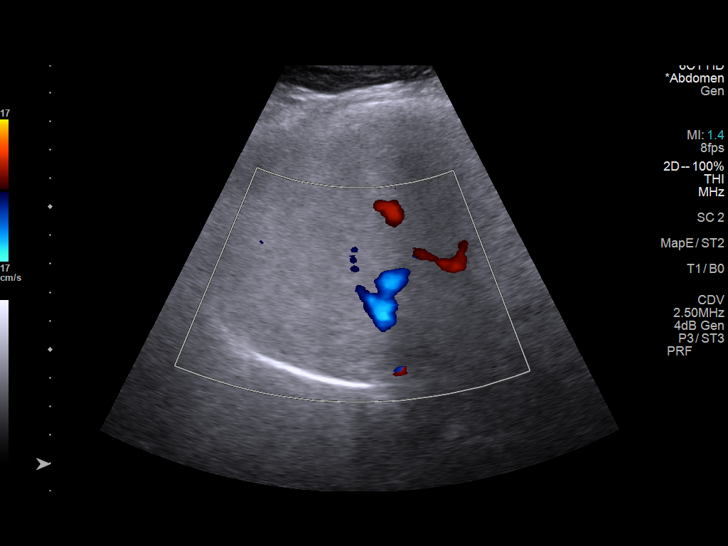

[14 of 25 positions shown; findings below may reference images not displayed]

FINDINGS: Gallbladder:

No gallstones or wall thickening visualized. No sonographic Murphy
sign noted by sonographer.

Common bile duct:

Diameter: Normal at 4 mm

Liver:

The hepatic parenchyma is echogenic in the echotexture is coarsened.
No discrete hepatic lesion identified. The adjacent renal parenchyma
appears hypoechoic in comparison to the liver. These findings are
most consistent with hepatic steatosis. Portal vein is patent on
color Doppler imaging with normal direction of blood flow towards
the liver.

Other: None.
IMPRESSION: 1. Hepatic steatosis.
2. No evidence of cholelithiasis or biliary ductal dilatation.

## 2022-05-01 NOTE — Therapy (Signed)
OUTPATIENT PHYSICAL THERAPY TREATMENT NOTE   Patient Name: ORMA CHEETHAM MRN: [id number] DOB:Aug 14, 1961, 61 y.o., female Today's Date: 05/01/2022  PCP: none REFERRING PROVIDER: Cristie Hem, PA-C     Past Medical History:  Diagnosis Date   Anemia    Arthritis    Hypertension    Thyroid disease    Past Surgical History:  Procedure Laterality Date   APPENDECTOMY     EYE SURGERY Right 2011   lasix    Patient Active Problem List   Diagnosis Date Noted   Sinus bradycardia 02/03/2020   Pre-syncope 02/02/2020   Right elbow pain 01/05/2017   Right hand pain 01/05/2017   Thyroid nodule 07/29/2016   Neck pain 10/29/2015   Hypertension, uncontrolled 08/22/2015   Chest pain with moderate risk of acute coronary syndrome 08/22/2015   GERD (gastroesophageal reflux disease) 08/22/2015   Hyperlipidemia 03/17/2013   FATIGUE 08/05/2007    THERAPY DIAG:  Muscle weakness  Cervicalgia  Low back pain, unspecified back pain laterality, unspecified chronicity, unspecified whether sciatica present  Pain in thoracic spine  REFERRING DIAG: Neck pain [M54.2], Pain in thoracic spine [M54.6], Chronic midline low back pain, unspecified whether sciatica present [M54.50, G89.29]  PERTINENT HISTORY: Hx of multi-joint pain (appears (-) for rheumatic disease)  PRECAUTIONS/RESTRICTIONS:   none  SUBJECTIVE:  Pt reports that she feels about the same, she has not noted any improvements yet. She states that her neck continues to hurt more than her lower back. She denies compliance w/ her HEP.   Pain:  Are you having pain? Yes Pain location: neck, thoracic, low back NPRS scale:  current 10/10 in her neck and 7/10 in her lower back.  average 9/10  Aggravating factors: activity Relieving factors: rest, pain medication Pain description: constant and aching Stage: Chronic 24 hour pattern: better in the morning, worse with activity   OBJECTIVE:   DIAGNOSTIC FINDINGS:  X-Ray Cx  spine - moderate degenerative changes Lx spine - mild lower degenerative changes   GENERAL OBSERVATION/GAIT:           Slow gait   SENSATION:          Light touch: Appears intact on exam, subjective sensation loss in hands and feet at times.   MUSCLE LENGTH: Hamstrings: Right moderate restriction; Left moderate restriction ASLR: Right ASLR = PSLR; Left ASLR = PSLR Thomas test: Right minimal restriction; Left minimal restriction   Cervical ROM   ROM ROM  04/25/2022  Flexion 60  Extension 70*  Right lateral flexion 40*  Left lateral flexion 40*  Right rotation 70*  Left rotation 70*  Flexion rotation (normal is 30 degrees)    Flexion rotation (normal is 30 degrees)      (Blank rows = not tested, N = WNL, * = concordant pain)     LUMBAR AROM   AROM AROM  04/25/2022  Flexion WNL, w/ concordant pain  Extension WNL, w/ concordant pain  Right lateral flexion WNL, w/ concordant pain  Left lateral flexion WNL, w/ concordant pain  Right rotation WNL, w/ concordant pain  Left rotation WNL, w/ concordant pain    (Blank rows = not tested)   DIRECTIONAL PREFERENCE:           None clear   LE MMT:   MMT Right 04/25/2022 Left 04/25/2022  Hip flexion (L2, L3) c c  Knee extension (L3) c c  Knee flexion      Hip abduction      Hip extension  Hip external rotation      Hip internal rotation      Hip adduction      Ankle dorsiflexion (L4) c c  Ankle plantarflexion (S1) c c  Ankle inversion      Ankle eversion      Great Toe ext (L5) c c  Grossly 3+    (Blank rows = not tested, score listed is out of 5 possible points.  N = WNL, D = diminished, C = clear for gross weakness with myotome testing, * = concordant pain with testing)   UPPER EXTREMITY MMT:   MMT Right 04/25/2022 Left 04/25/2022  Shoulder flexion      Shoulder abduction (C5) c c  Shoulder ER      Shoulder IR      Middle trapezius      Lower trapezius      Shoulder extension      Grip strength      Cervical  flexion (C1,C2)      Cervical S/B (C3)      Shoulder shrug (C4) c c  Elbow flexion (C6) c c  Elbow ext (C7) c c  Thumb ext (C8) c c  Finger abd (T1) c c  Grossly 4 4    (Blank rows = not tested, score listed is out of 5 possible points.  N = WNL, D = diminished, C = clear for gross weakness with myotome testing, * = concordant pain with testing)     Functional Tests   Eval (04/25/2022)      Sustained supine bridge (dominant leg extended at 120'', if reached): 10'' (norm 170'')                                                                                                      LUMBAR SPECIAL TESTS:  Straight leg raise: L (-), R (-) Slump: L (-), R (-) Babinski, clonus, hoffmans (-) Cervical radiculopathy cluster (-)    PALPATION:            TTP bil UT, cervical paraspinals    SPINAL SEGMENTAL MOBILITY ASSESSMENT:  Hypomobile throughout Cx spine and lumbar spine   PATIENT SURVEYS:  Modified Oswestry Low Back Pain Disability Questionnaire: 23 / 50 = 46.0 %     TODAY'S TREATMENT  Reviewing HEP, progression of exercises.  PATIENT EDUCATION:  POC, diagnosis, prognosis, HEP, and outcome measures.  Pt educated via explanation, demonstration, and handout (HEP).  Pt confirms understanding verbally. Pt educated on dry needling and importance of compliance with HEP.    HOME EXERCISE PROGRAM: Access Code: [email or web address] URL: [email or web address] Date: 04/25/2022 Prepared by: Alphonzo Severance   Exercises - Supine Lower Trunk Rotation  - 1 x daily - 7 x weekly - 1 sets - 20 reps - 3 hold - Supine Posterior Pelvic Tilt  - 2 x daily - 7 x weekly - 2 sets - 10 reps - 5'' hold - Seated Isometric Cervical Sidebending  - 2 x daily - 7 x weekly - 10 reps - 10 second hold - Seated Isometric Cervical Extension  -  2 x daily - 7 x weekly - 10 reps - 10 second hold - Seated Isometric Cervical Flexion  - 2 x daily - 7 x weekly - 10 reps - 10 second hold    ASTERISK  SIGNS     Asterisk Signs Eval (04/25/2022)            pain 9/10 max, 8/10 average            Supine bridge  10'' with pain             LE MMT                                              [facility] Adult PT Treatment:                                                DATE: 05/01/2022 Therapeutic Exercise: NuStep 5 min for warm up. Seat 2, arm 9 level. Supine Lower Trunk Rotation  30 reps - 3 sec hold Supine Posterior Pelvic Tilt 10 reps - 5 sec hold SKTC 30 sec hold x 2 Levator scap stretch x 2 30 sec hold UT stretch x 2 30 sec hold Manual Therapy: STM, L UT, LS Pin and stretch L UT PA mobs C6-T1, pt in supine, G III Neuromuscular re-ed: - Seated Isometric Cervical Sidebending -10 reps - 10 second hold - Seated Isometric Cervical Extension -10 reps - 10 second hold - Seated Isometric Cervical Flexion  10 reps - 10 second hold - Supine clams w/ blue theraband. VC for eccentric control. 2 x10    TREATMENT 6/13:  Therapeutic Exercise: - LTR - 20x - bridge - small arc - 3x10 - alternating clam - blue TB - 3x10 - supine hip adduction ball squeeze - 3x10  Manual Therapy: - STM, bil UT - pin and stretch, bil UT - PA mob C6-TI, pt in supine, G III   ASSESSMENT:   CLINICAL IMPRESSION: Ermine tolerated session well. Pt arrived late so visit was truncated. Session initiated on Nustep today for increased peripheral blood flow. Followed by mobility and strengthening exercises for cervical and LE musculature. Pt requires frequent verbal cues for proper forum and eccentric control. Pt responded well to manual therapy with significant reduction in cervical pain.    OBJECTIVE IMPAIRMENTS: Pain, LE/Core/Hip weakness   ACTIVITY LIMITATIONS: walking, standing, lifting, work   PERSONAL FACTORS: See medical history and pertinent history     REHAB POTENTIAL: Fair chronic condition and global pain   CLINICAL DECISION MAKING: Stable/uncomplicated   EVALUATION COMPLEXITY: Low     GOALS:      SHORT TERM GOALS: Target date: 05/16/2022   Journie will be >75% HEP compliant to improve carryover between sessions and facilitate independent management of condition   Evaluation (04/25/2022): ongoing Goal status: INITIAL     LONG TERM GOALS: Target date: 06/20/2022   Camika will show a >/= 12 pt improvement in their ODI score (MCID is 12 pts) as a proxy for functional improvement    Evaluation/Baseline (04/25/2022): Modified Oswestry Low Back Pain Disability Questionnaire: 23 / 50 = 46.0 % Goal status: INITIAL     2.  Kylinn will self report >/= 50% decrease in pain from evaluation  Evaluation/Baseline (04/25/2022): 9/10 max pain, 8/10 average Goal status: INITIAL     3.  Kamyah will report confidence in self management of condition at time of discharge with advanced HEP   Evaluation/Baseline (04/25/2022): unable to self manage Goal status: INITIAL     4.  Perri will be able to maintain supine bridge for 40'' (dominant leg extended if 120'' reached) as evidence of improved hip extension and core strength (norm for healthy adult ~170'')    Evaluation/Baseline (04/25/2022): 10'' Goal status: INITIAL     PLAN: PT FREQUENCY: 1-2x/week   PT DURATION: 8 weeks (Ending 06/20/2022)   PLANNED INTERVENTIONS: Therapeutic exercises, Aquatic therapy, Therapeutic activity, Neuro Muscular re-education, Gait training, Patient/Family education, Joint mobilization, Dry Needling, Electrical stimulation, Spinal mobilization and/or manipulation, Moist heat, Taping, Vasopneumatic device, Ionotophoresis 4mg /ml Dexamethasone, and Manual therapy   PLAN FOR NEXT SESSION: global progressive strengthening and graded exposure, progressive hip/core/LE strengthening. Pt requested dry needling next session.    Kaedyn Belardo PT 05/01/2022, 9:25 AM

## 2022-05-05 ENCOUNTER — Telehealth: Payer: Self-pay | Admitting: Physical Therapy

## 2022-05-05 ENCOUNTER — Ambulatory Visit: Payer: 59 | Admitting: Physical Therapy

## 2022-05-05 NOTE — Telephone Encounter (Signed)
Called and informed patient of missed visit and provided reminder of next appt and attendance policy.  

## 2022-05-07 ENCOUNTER — Ambulatory Visit: Payer: 59 | Admitting: Physical Therapy

## 2022-05-07 ENCOUNTER — Encounter: Payer: Self-pay | Admitting: Physical Therapy

## 2022-05-07 DIAGNOSIS — M546 Pain in thoracic spine: Secondary | ICD-10-CM

## 2022-05-07 DIAGNOSIS — M6281 Muscle weakness (generalized): Secondary | ICD-10-CM | POA: Diagnosis not present

## 2022-05-07 DIAGNOSIS — M545 Low back pain, unspecified: Secondary | ICD-10-CM

## 2022-05-07 DIAGNOSIS — M542 Cervicalgia: Secondary | ICD-10-CM

## 2022-05-07 NOTE — Therapy (Signed)
OUTPATIENT PHYSICAL THERAPY TREATMENT NOTE   Patient Name: Pam Hart MRN: [id number] DOB:[birth year], 61 y.o., female Today's Date: 05/07/2022  PCP: none REFERRING PROVIDER: Cristie Hem, PA-C   PT End of Session - 05/07/22 1616     Visit Number 4    Number of Visits --   1-2x/week   Authorization Type [employer]    Authorization - Number of Visits 30    PT Start Time 0416    PT Stop Time 0457    PT Time Calculation (min) 41 min             Past Medical History:  Diagnosis Date   Anemia    Arthritis    Hypertension    Thyroid disease    Past Surgical History:  Procedure Laterality Date   APPENDECTOMY     EYE SURGERY Right 2011   lasix    Patient Active Problem List   Diagnosis Date Noted   Sinus bradycardia 02/03/2020   Pre-syncope 02/02/2020   Right elbow pain 01/05/2017   Right hand pain 01/05/2017   Thyroid nodule 07/29/2016   Neck pain 10/29/2015   Hypertension, uncontrolled 08/22/2015   Chest pain with moderate risk of acute coronary syndrome 08/22/2015   GERD (gastroesophageal reflux disease) 08/22/2015   Hyperlipidemia 03/17/2013   FATIGUE 08/05/2007    THERAPY DIAG:  Muscle weakness  Cervicalgia  Low back pain, unspecified back pain laterality, unspecified chronicity, unspecified whether sciatica present  Pain in thoracic spine  REFERRING DIAG: Neck pain [M54.2], Pain in thoracic spine [M54.6], Chronic midline low back pain, unspecified whether sciatica present [M54.50, G89.29]  PERTINENT HISTORY: Hx of multi-joint pain (appears (-) for rheumatic disease)  PRECAUTIONS/RESTRICTIONS:   none  SUBJECTIVE:  Pt states she is feeling a bit better.  Her back is improving, her neck is improving but still painful.  Pain:  Are you having pain? Yes Pain location: neck, thoracic NPRS scale:  current 7/10  average 8/10  Aggravating factors: activity Relieving factors: rest, pain medication Pain description: constant and  aching Stage: Chronic 24 hour pattern: better in the morning, worse with activity   OBJECTIVE:   DIAGNOSTIC FINDINGS:  X-Ray Cx spine - moderate degenerative changes Lx spine - mild lower degenerative changes   GENERAL OBSERVATION/GAIT:           Slow gait   SENSATION:          Light touch: Appears intact on exam, subjective sensation loss in hands and feet at times.   MUSCLE LENGTH: Hamstrings: Right moderate restriction; Left moderate restriction ASLR: Right ASLR = PSLR; Left ASLR = PSLR Thomas test: Right minimal restriction; Left minimal restriction   Cervical ROM   ROM ROM  04/25/2022  Flexion 60  Extension 70*  Right lateral flexion 40*  Left lateral flexion 40*  Right rotation 70*  Left rotation 70*  Flexion rotation (normal is 30 degrees)    Flexion rotation (normal is 30 degrees)      (Blank rows = not tested, N = WNL, * = concordant pain)     LUMBAR AROM   AROM AROM  04/25/2022  Flexion WNL, w/ concordant pain  Extension WNL, w/ concordant pain  Right lateral flexion WNL, w/ concordant pain  Left lateral flexion WNL, w/ concordant pain  Right rotation WNL, w/ concordant pain  Left rotation WNL, w/ concordant pain    (Blank rows = not tested)   DIRECTIONAL PREFERENCE:  None clear   LE MMT:   MMT Right 04/25/2022 Left 04/25/2022  Hip flexion (L2, L3) c c  Knee extension (L3) c c  Knee flexion      Hip abduction      Hip extension      Hip external rotation      Hip internal rotation      Hip adduction      Ankle dorsiflexion (L4) c c  Ankle plantarflexion (S1) c c  Ankle inversion      Ankle eversion      Great Toe ext (L5) c c  Grossly 3+    (Blank rows = not tested, score listed is out of 5 possible points.  N = WNL, D = diminished, C = clear for gross weakness with myotome testing, * = concordant pain with testing)   UPPER EXTREMITY MMT:   MMT Right 04/25/2022 Left 04/25/2022  Shoulder flexion      Shoulder abduction (C5) c c   Shoulder ER      Shoulder IR      Middle trapezius      Lower trapezius      Shoulder extension      Grip strength      Cervical flexion (C1,C2)      Cervical S/B (C3)      Shoulder shrug (C4) c c  Elbow flexion (C6) c c  Elbow ext (C7) c c  Thumb ext (C8) c c  Finger abd (T1) c c  Grossly 4 4    (Blank rows = not tested, score listed is out of 5 possible points.  N = WNL, D = diminished, C = clear for gross weakness with myotome testing, * = concordant pain with testing)     Functional Tests   Eval (04/25/2022)      Sustained supine bridge (dominant leg extended at 120'', if reached): 10'' (norm 170'')                                                                                                      LUMBAR SPECIAL TESTS:  Straight leg raise: L (-), R (-) Slump: L (-), R (-) Babinski, clonus, hoffmans (-) Cervical radiculopathy cluster (-)    PALPATION:            TTP bil UT, cervical paraspinals    SPINAL SEGMENTAL MOBILITY ASSESSMENT:  Hypomobile throughout Cx spine and lumbar spine   PATIENT SURVEYS:  Modified Oswestry Low Back Pain Disability Questionnaire: 23 / 50 = 46.0 %     TODAY'S TREATMENT  Creating, reviewing, and completing below HEP   PATIENT EDUCATION:  POC, diagnosis, prognosis, HEP, and outcome measures.  Pt educated via explanation, demonstration, and handout (HEP).  Pt confirms understanding verbally.    HOME EXERCISE PROGRAM: Access Code: [email or web address] URL: [email or web address] Date: 04/25/2022 Prepared by: Alphonzo Severance   Exercises - Supine Lower Trunk Rotation  - 1 x daily - 7 x weekly - 1 sets - 20 reps - 3 hold - Supine Posterior Pelvic Tilt  - 2 x daily - 7  x weekly - 2 sets - 10 reps - 5'' hold - Seated Isometric Cervical Sidebending  - 2 x daily - 7 x weekly - 10 reps - 10 second hold - Seated Isometric Cervical Extension  - 2 x daily - 7 x weekly - 10 reps - 10 second hold - Seated Isometric Cervical  Flexion  - 2 x daily - 7 x weekly - 10 reps - 10 second hold   ASTERISK SIGNS     Asterisk Signs Eval (04/25/2022)            pain 9/10 max, 8/10 average            Supine bridge  10'' with pain             LE MMT                                               TREATMENT 6/21:  Therapeutic Exercise: - bike - 5 min for warm up - LTR - 20x - bridge - 3x10 - 3'' - Clam - green TB - 2x10 - supine hip adduction piliates ring squeeze - 3x10  Manual Therapy: - STM, bil UT - pin and stretch, bil UT - pin and stretch, bil LS  Trigger Point Dry-Needling  Treatment instructions: Expect mild to moderate muscle soreness. S/S of pneumothorax if dry needled over a lung field, and to seek immediate medical attention should they occur. Patient verbalized understanding of these instructions and education.  Patient Consent Given: Yes Education handout provided: No Muscles treated: L UT  Electrical stimulation performed: No Parameters: N/A Treatment response/outcome: twitch   TREATMENT 6/13:  Therapeutic Exercise: - LTR - 20x - bridge - small arc - 3x10 - alternating clam - blue TB - 3x10 - supine hip adduction ball squeeze - 3x10  Manual Therapy: - STM, bil UT - pin and stretch, bil UT - PA mob C6-TI, pt in supine, G III   ASSESSMENT:   CLINICAL IMPRESSION: Chastidy is progressing well with therapy, particularly with her LBP.  She was able to complete full arc bridges today with minimal pain and a 3'' hold.  Her neck is still painful but slowly improving.  We trialed TDN of L UT today (1x) and will monitor response.   OBJECTIVE IMPAIRMENTS: Pain, LE/Core/Hip weakness   ACTIVITY LIMITATIONS: walking, standing, lifting, work   PERSONAL FACTORS: See medical history and pertinent history     REHAB POTENTIAL: Fair chronic condition and global pain   CLINICAL DECISION MAKING: Stable/uncomplicated   EVALUATION COMPLEXITY: Low     GOALS:     SHORT TERM GOALS: Target date:  05/16/2022   Halina will be >75% HEP compliant to improve carryover between sessions and facilitate independent management of condition   Evaluation (04/25/2022): ongoing Goal status: INITIAL     LONG TERM GOALS: Target date: 06/20/2022   Alyia will show a >/= 12 pt improvement in their ODI score (MCID is 12 pts) as a proxy for functional improvement    Evaluation/Baseline (04/25/2022): Modified Oswestry Low Back Pain Disability Questionnaire: 23 / 50 = 46.0 % Goal status: INITIAL     2.  Yariana will self report >/= 50% decrease in pain from evaluation    Evaluation/Baseline (04/25/2022): 9/10 max pain, 8/10 average Goal status: INITIAL     3.  Anjani will report confidence in  self management of condition at time of discharge with advanced HEP   Evaluation/Baseline (04/25/2022): unable to self manage Goal status: INITIAL     4.  Sahira will be able to maintain supine bridge for 40'' (dominant leg extended if 120'' reached) as evidence of improved hip extension and core strength (norm for healthy adult ~170'')    Evaluation/Baseline (04/25/2022): 10'' Goal status: INITIAL     PLAN: PT FREQUENCY: 1-2x/week   PT DURATION: 8 weeks (Ending 06/20/2022)   PLANNED INTERVENTIONS: Therapeutic exercises, Aquatic therapy, Therapeutic activity, Neuro Muscular re-education, Gait training, Patient/Family education, Joint mobilization, Dry Needling, Electrical stimulation, Spinal mobilization and/or manipulation, Moist heat, Taping, Vasopneumatic device, Ionotophoresis 4mg /ml Dexamethasone, and Manual therapy   PLAN FOR NEXT SESSION: global progressive strengthening and graded exposure, progressive hip/core/LE strengthening   Lorriane Dehart PT 05/07/2022, 4:58 PM

## 2022-05-16 ENCOUNTER — Ambulatory Visit: Payer: Self-pay | Admitting: Internal Medicine

## 2023-01-22 ENCOUNTER — Other Ambulatory Visit: Payer: Self-pay | Admitting: Internal Medicine

## 2023-01-22 DIAGNOSIS — K760 Fatty (change of) liver, not elsewhere classified: Secondary | ICD-10-CM

## 2023-01-22 DIAGNOSIS — R1011 Right upper quadrant pain: Secondary | ICD-10-CM

## 2023-02-18 ENCOUNTER — Ambulatory Visit
Admission: RE | Admit: 2023-02-18 | Discharge: 2023-02-18 | Disposition: A | Discharge status: Home / Self Care | Payer: Commercial Managed Care - HMO | Source: Ambulatory Visit | Attending: Internal Medicine

## 2023-02-18 DIAGNOSIS — R1011 Right upper quadrant pain: Secondary | ICD-10-CM

## 2023-02-18 DIAGNOSIS — K760 Fatty (change of) liver, not elsewhere classified: Secondary | ICD-10-CM

## 2023-06-30 ENCOUNTER — Other Ambulatory Visit: Payer: Self-pay | Admitting: Family Medicine

## 2023-06-30 DIAGNOSIS — R6881 Early satiety: Secondary | ICD-10-CM

## 2023-06-30 DIAGNOSIS — R1011 Right upper quadrant pain: Secondary | ICD-10-CM

## 2023-06-30 DIAGNOSIS — R630 Anorexia: Secondary | ICD-10-CM

## 2023-06-30 DIAGNOSIS — R1013 Epigastric pain: Secondary | ICD-10-CM

## 2023-06-30 DIAGNOSIS — R5382 Chronic fatigue, unspecified: Secondary | ICD-10-CM

## 2023-06-30 DIAGNOSIS — K760 Fatty (change of) liver, not elsewhere classified: Secondary | ICD-10-CM

## 2023-07-27 ENCOUNTER — Other Ambulatory Visit: Payer: Commercial Managed Care - HMO

## 2023-07-28 ENCOUNTER — Ambulatory Visit
Admission: RE | Admit: 2023-07-28 | Discharge: 2023-07-28 | Disposition: A | Discharge status: Home / Self Care | Payer: Commercial Managed Care - HMO | Source: Ambulatory Visit | Attending: Family Medicine | Admitting: Family Medicine

## 2023-07-28 DIAGNOSIS — K760 Fatty (change of) liver, not elsewhere classified: Secondary | ICD-10-CM

## 2023-07-28 DIAGNOSIS — R5382 Chronic fatigue, unspecified: Secondary | ICD-10-CM

## 2023-07-28 DIAGNOSIS — R6881 Early satiety: Secondary | ICD-10-CM

## 2023-07-28 DIAGNOSIS — R1011 Right upper quadrant pain: Secondary | ICD-10-CM

## 2023-07-28 DIAGNOSIS — R1013 Epigastric pain: Secondary | ICD-10-CM

## 2023-07-28 DIAGNOSIS — R630 Anorexia: Secondary | ICD-10-CM

## 2023-12-10 ENCOUNTER — Encounter: Payer: Self-pay | Admitting: Podiatry

## 2023-12-10 ENCOUNTER — Ambulatory Visit (INDEPENDENT_AMBULATORY_CARE_PROVIDER_SITE_OTHER): Payer: Medicaid Other

## 2023-12-10 ENCOUNTER — Ambulatory Visit (INDEPENDENT_AMBULATORY_CARE_PROVIDER_SITE_OTHER): Payer: Medicaid Other | Admitting: Podiatry

## 2023-12-10 VITALS — Ht 62.0 in | Wt 143.0 lb

## 2023-12-10 DIAGNOSIS — M79671 Pain in right foot: Secondary | ICD-10-CM | POA: Diagnosis not present

## 2023-12-10 DIAGNOSIS — M7661 Achilles tendinitis, right leg: Secondary | ICD-10-CM | POA: Diagnosis not present

## 2023-12-10 MED ORDER — TRIAMCINOLONE ACETONIDE 10 MG/ML IJ SUSP
10.0000 mg | Freq: Once | INTRAMUSCULAR | Status: AC
  Administered 2023-12-10: 10 mg via INTRA_ARTICULAR

## 2023-12-10 NOTE — Patient Instructions (Signed)

## 2023-12-10 NOTE — Progress Notes (Signed)
Subjective:   Patient ID: Pam Hart, female   DOB: 63 y.o.   MRN: [id number]   HPI Patient presents with pain in the back of the right heel which has been present for approximately 6 months.  States has been very sore and that it is hard to do a lot of walking and she has not done anything for it currently.  Patient does not smoke tries to be active   Review of Systems  All other systems reviewed and are negative.       Objective:  Physical Exam Vitals and nursing note reviewed.  Constitutional:      Appearance: She is well-developed.  Pulmonary:     Effort: Pulmonary effort is normal.  Musculoskeletal:        General: Normal range of motion.  Skin:    General: Skin is warm.  Neurological:     Mental Status: She is alert.     Neurovascular status intact muscle strength adequate range of motion adequate significant posterior pain right heel at the insertion tendon calcaneus lateral side with fluid buildup in this area no central medial involvement with moderate equinus condition noted.  Good digital perfusion well-oriented x 3     Assessment:  Acute Achilles tendinitis right inflammation fluid buildup     Plan:  H&P x-rays taken reviewed careful steroid injection lateral side 3 mg dexamethasone Kenalog 5 mg Xylocaine after explaining the chances of rupture associated with injection therapy.  I went ahead and I am having her get an air fracture walker through [employer] as it will be much less expensive for her and I want her to use ice and heel lift and will be seen back as needed  X-rays indicate fairly large posterior spur formation right heel no other pathology noted

## 2023-12-15 ENCOUNTER — Encounter: Payer: Self-pay | Admitting: Nurse Practitioner

## 2024-01-22 ENCOUNTER — Ambulatory Visit: Payer: Commercial Managed Care - HMO | Admitting: Nurse Practitioner

## 2024-01-22 ENCOUNTER — Other Ambulatory Visit

## 2024-01-22 ENCOUNTER — Encounter: Payer: Self-pay | Admitting: Nurse Practitioner

## 2024-01-22 VITALS — BP 126/66 | HR 64 | Ht 62.0 in | Wt 151.0 lb

## 2024-01-22 DIAGNOSIS — Z8719 Personal history of other diseases of the digestive system: Secondary | ICD-10-CM

## 2024-01-22 DIAGNOSIS — R1011 Right upper quadrant pain: Secondary | ICD-10-CM | POA: Diagnosis not present

## 2024-01-22 DIAGNOSIS — K760 Fatty (change of) liver, not elsewhere classified: Secondary | ICD-10-CM

## 2024-01-22 DIAGNOSIS — K5900 Constipation, unspecified: Secondary | ICD-10-CM

## 2024-01-22 DIAGNOSIS — Z8619 Personal history of other infectious and parasitic diseases: Secondary | ICD-10-CM

## 2024-01-22 LAB — CBC WITH DIFFERENTIAL/PLATELET
Basophils Absolute: 0 10*3/uL (ref 0.0–0.1)
Basophils Relative: 0.3 % (ref 0.0–3.0)
Eosinophils Absolute: 0.2 10*3/uL (ref 0.0–0.7)
Eosinophils Relative: 3 % (ref 0.0–5.0)
HCT: 43.1 % (ref 36.0–46.0)
Hemoglobin: 14.2 g/dL (ref 12.0–15.0)
Lymphocytes Relative: 45.6 % (ref 12.0–46.0)
Lymphs Abs: 2.6 10*3/uL (ref 0.7–4.0)
MCHC: 32.9 g/dL (ref 30.0–36.0)
MCV: 83.3 fl (ref 78.0–100.0)
Monocytes Absolute: 0.4 10*3/uL (ref 0.1–1.0)
Monocytes Relative: 6.4 % (ref 3.0–12.0)
Neutro Abs: 2.5 10*3/uL (ref 1.4–7.7)
Neutrophils Relative %: 44.7 % (ref 43.0–77.0)
Platelets: 321 10*3/uL (ref 150.0–400.0)
RBC: 5.18 Mil/uL — ABNORMAL HIGH (ref 3.87–5.11)
RDW: 13.9 % (ref 11.5–15.5)
WBC: 5.7 10*3/uL (ref 4.0–10.5)

## 2024-01-22 LAB — COMPREHENSIVE METABOLIC PANEL
ALT: 17 U/L (ref 0–35)
AST: 16 U/L (ref 0–37)
Albumin: 4.4 g/dL (ref 3.5–5.2)
Alkaline Phosphatase: 59 U/L (ref 39–117)
BUN: 12 mg/dL (ref 6–23)
CO2: 32 meq/L (ref 19–32)
Calcium: 9.8 mg/dL (ref 8.4–10.5)
Chloride: 102 meq/L (ref 96–112)
Creatinine, Ser: 0.76 mg/dL (ref 0.40–1.20)
GFR: 83.32 mL/min (ref >60.00)
Glucose, Bld: 117 mg/dL — ABNORMAL HIGH (ref 70–99)
Potassium: 4.2 meq/L (ref 3.5–5.1)
Sodium: 142 meq/L (ref 135–145)
Total Bilirubin: 0.3 mg/dL (ref 0.2–1.2)
Total Protein: 7.5 g/dL (ref 6.0–8.3)

## 2024-01-22 NOTE — Patient Instructions (Signed)
 You have been scheduled for an abdominal ultrasound at [facility] (1st floor of hospital) on 01/24/24 at 1:00 pm. Please arrive 15-20 minutes prior to your appointment for registration. Make certain not to have anything to eat or drink 6 hours prior to your appointment. Should you need to reschedule your appointment, please contact radiology at [phone]. This test typically takes about 30 minutes to perform.  Take Miralax 1 capful mixed in 8 ounces of water at bed time for constipation as tolerated.  Reduce the carbohydrates in your diet, ie: reduce breads/pasta/rice/potatoes and sweets, exercise as tolerated and lose weight to reduce the risk of developing fatty liver disease  Call our office when you are ready to schedule an upper endoscopy and colonoscopy    Your provider has requested that you go to the basement level for lab work before leaving today. Press "B" on the elevator. The lab is located at the first door on the left as you exit the elevator.  Due to recent changes in healthcare laws, you may see the results of your imaging and laboratory studies on MyChart before your provider has had a chance to review them.  We understand that in some cases there may be results that are confusing or concerning to you. Not all laboratory results come back in the same time frame and the provider may be waiting for multiple results in order to interpret others.  Please give [place] 48 hours in order for your provider to thoroughly review all the results before contacting the office for clarification of your results.   Thank you for trusting me with your gastrointestinal care!   Alcide Evener, CRNP

## 2024-01-22 NOTE — Progress Notes (Signed)
 01/22/2024 Pam Hart [id number] Feb 21, 1961   CHIEF COMPLAINT: RUQ pain, fatty liver  HISTORY OF PRESENT ILLNESS: Pam Hart with a past medical history of arthritis, hypertension, H. Pylori and GERD. She describes having rectocele surgery in 2003.   She was initially seen in our office 04/12/2015 by Mike Gip PA-C (Dr. Leone Payor supervising physician) for further evaluation regarding epigastric pain and constipation.  H. pylori breath test was reported as positive on 03/27/2015 which was treated with a Prevpac per her PCP.  She was prescribed MiraLAX for constipation and a screening screening colonoscopy was ordered but was not completed.  She has not been seen in our office since then.   She presents to our office today as referred by Inez Pilgrim NP for further evaluation regarding RUQ which comes and goes for the past 3 years and sometimes is worse after eating.  No nausea or vomiting.  She feels like something is inside her "organs".  Her RUQ pain is worse when she lays on her right side. She underwent a CTAP without contrast 07/28/2023 which showed evidence of hepatic steatosis. She is concerned regarding her risk of liver cancer.  No known family history of liver cancer.  She has a history of GERD which is well-controlled on Pantoprazole 40 mg daily.  Prior history of H. pylori 03/2015 treated with Prevpac as noted above.  No dysphagia.  She takes Meloxicam as needed for arthritis or muscle pain.  She passes a normal brown formed stool most days.  She has occasional constipation.  No rectal bleeding or black stools.  No known family history of esophageal, gastric or colon cancer.      Latest Ref Rng & Units 05/15/2021   10:54 AM 02/03/2020    2:49 AM 02/02/2020   11:56 AM  CBC  WBC 4.0 - 10.5 K/uL 6.5  8.1  6.6   Hemoglobin 12.0 - 15.0 g/dL 78.2  95.6  21.3   Hematocrit 36.0 - 46.0 % 44.7  38.2  41.7   Platelets 150 - 400 K/uL 297  297  303        Latest Ref Rng & Units  05/15/2021   10:54 AM 02/03/2020    2:49 AM 02/02/2020   11:56 AM  CMP  Glucose 70 - 99 mg/dL 086  578  91   BUN 6 - 20 mg/dL 9  7  9    Creatinine 0.44 - 1.00 mg/dL 4.69  6.29  5.28   Sodium 135 - 145 mmol/L 139  143  137   Potassium 3.5 - 5.1 mmol/L 4.5  3.7  4.3   Chloride 98 - 111 mmol/L 103  110  103   CO2 22 - 32 mmol/L 29  25  25    Calcium 8.9 - 10.3 mg/dL 9.5  8.8  9.1   Total Protein 6.5 - 8.1 g/dL 6.9     Total Bilirubin 0.3 - 1.2 mg/dL 0.4     Alkaline Phos 38 - 126 U/L 69     AST 15 - 41 U/L 23     ALT 0 - 44 U/L 27       Past Medical History:  Diagnosis Date   Anemia    Arthritis    pt requested removal   Hypertension    Thyroid disease    pt requested removal   Past Surgical History:  Procedure Laterality Date   APPENDECTOMY     CARPAL TUNNEL RELEASE     EYE  SURGERY Right 11/17/2009   lasix    Social History: She is originally from [place], she has resided in the [place] x 28 years.  Non-smoker.  No alcohol use.  No drug use.  Family History: No known family history of esophageal, gastric or colon cancer. Mother with hypertension. Father with Alzheimer's disease and glaucoma. Sister with diabetes. Sister with hypertension.   No Known Allergies    Outpatient Encounter Medications as of 01/22/2024  Medication Sig   amLODipine (NORVASC) 10 MG tablet Take 1 tablet by mouth daily.   gabapentin (NEURONTIN) 300 MG capsule Take 1-2 capsules (300-600 mg total) by mouth at bedtime.   meloxicam (MOBIC) 7.5 MG tablet Take 1 tablet (7.5 mg total) by mouth daily. Take 1-2 tablets once daily as needed for pain.   methocarbamol (ROBAXIN) 500 MG tablet Take 1 tablet (500 mg total) by mouth 2 (two) times daily as needed for muscle spasms.   olmesartan-hydrochlorothiazide (BENICAR HCT) 40-12.5 MG tablet Take 1 tablet by mouth daily.   pantoprazole (PROTONIX) 40 MG tablet Take 1 tablet (40 mg total) by mouth daily. Take 30 minutes prior to first meal   lisinopril (ZESTRIL) 20 MG tablet  Take 1 tablet by mouth daily. (Patient not taking: Reported on 01/22/2024)   [DISCONTINUED] amoxicillin (AMOXIL) 875 MG tablet Take 875 mg by mouth 2 (two) times daily. (Patient not taking: Reported on 01/22/2024)   [DISCONTINUED] predniSONE (STERAPRED UNI-PAK 21 TAB) 10 MG (21) TBPK tablet Take as directed (Patient not taking: Reported on 01/22/2024)   No facility-administered encounter medications on file as of 01/22/2024.   REVIEW OF SYSTEMS:  Gen: Denies fever, sweats or chills. No weight loss.  CV: Denies chest pain, palpitations or edema. Resp: Denies cough, shortness of breath of hemoptysis.  GI: See HPI. GU: Denies urinary burning, blood in urine, increased urinary frequency or incontinence. MS: Denies joint pain, muscles aches or weakness. Derm: Denies rash, itchiness, skin lesions or unhealing ulcers. Psych: Denies depression, anxiety, memory loss or confusion. Heme: Denies bruising, easy bleeding. Neuro:  Denies headaches, dizziness or paresthesias. Endo:  Denies any problems with DM, thyroid or adrenal function.  PHYSICAL EXAM: Ht 5\' 2"  (1.575 m)   Wt 151 lb (68.5 kg)   BMI 27.62 kg/m  General: 63 year old female in no acute distress. Head: Normocephalic and atraumatic. Eyes:  Sclerae non-icteric, conjunctive pink. Ears: Normal auditory acuity. Mouth: Dentition intact. No ulcers or lesions.  Neck: Supple, no lymphadenopathy or thyromegaly.  Lungs: Clear bilaterally to auscultation without wheezes, crackles or rhonchi. Heart: Regular rate and rhythm. No murmur, rub or gallop appreciated.  Abdomen: Soft, nondistended.  Mild epigastric and RUQ tenderness without rebound or guarding.  No masses. No hepatosplenomegaly. Normoactive bowel sounds x 4 quadrants.  Rectal: Deferred. Musculoskeletal: Symmetrical with no gross deformities. Skin: Warm and dry. No rash or lesions on visible extremities. Extremities: No edema. Neurological: Alert oriented x 4, no focal deficits.   Psychological:  Alert and cooperative. Normal mood and affect.  ASSESSMENT AND PLAN:  63 year old female with a history of GERD, H. Pylori presenting with RUQ pain, intermittent over the past 3 years.  Positive H. pylori breath test treated with Prev pack in 2016 by PCP, post treatment H. pylori breath test/stool antigen test was not done. CTAP 07/2023 showed evidence of hepatic steatosis without acute intra-abdominal/pelvic pathology to explain her pain. -[facility] H. Pylori stool antigen -CBC and CMP -EGD to rule out GER/H. Pylori gastritis/ulcers/UGI malignancy, benefits and risks discussed including risk with sedation,  risk of bleeding, perforation and infection  -Patient will contact our office when she is ready to schedule a colonoscopy, see note below -RUQ sonogram to evaluate the gallbladder and biliary tract -Continue pantoprazole 40 mg daily -Avoid Mor other NSAID use  Constipation -MiraLAX nightly as needed  Colon cancer screening -Colonoscopy benefits and risks discussed including risk with sedation, risk of bleeding, perforation and infection  -Patient will contact our office when she is ready to schedule a colonoscopy, see note below  Hepatic steatosis.  -Patient instructed to reduce carbohydrates in diet i.e.: Bread/pasta/bread/rice and sweets, exercise as tolerated and lose weight to reduce the risk of developing fatty liver disease -CMP as ordered above  Patient did not wish to schedule an EGD and colonoscopy at this time as she [place] the upcoming [place].  She is mostly concerned about having her liver and gallbladder checked at this time.  Since she did not wish to pursue an EGD at this time, I ordered a  [facility] H. Pylori stool antigen test to rule out persistent or recurrent H. pylori infection.          CC:  Inez Pilgrim, NP

## 2024-01-24 ENCOUNTER — Ambulatory Visit (HOSPITAL_COMMUNITY)
Admission: RE | Admit: 2024-01-24 | Discharge: 2024-01-24 | Disposition: A | Discharge status: Home / Self Care | Source: Ambulatory Visit | Attending: Nurse Practitioner | Admitting: Nurse Practitioner

## 2024-01-24 DIAGNOSIS — R1011 Right upper quadrant pain: Secondary | ICD-10-CM | POA: Diagnosis present

## 2024-01-24 DIAGNOSIS — K760 Fatty (change of) liver, not elsewhere classified: Secondary | ICD-10-CM | POA: Insufficient documentation

## 2024-01-27 ENCOUNTER — Telehealth: Payer: Self-pay

## 2024-01-27 NOTE — Telephone Encounter (Signed)
 Contacted patient and patient verbalized understanding of negative h pylori [facility] stool test.

## 2024-02-24 ENCOUNTER — Ambulatory Visit (INDEPENDENT_AMBULATORY_CARE_PROVIDER_SITE_OTHER): Admitting: Podiatrist

## 2024-02-24 ENCOUNTER — Encounter: Payer: Self-pay | Admitting: Podiatrist

## 2024-02-24 VITALS — Ht 62.0 in | Wt 151.0 lb

## 2024-02-24 DIAGNOSIS — M7731 Calcaneal spur, right foot: Secondary | ICD-10-CM

## 2024-02-24 DIAGNOSIS — M7661 Achilles tendinitis, right leg: Secondary | ICD-10-CM

## 2024-02-24 MED ORDER — NITROGLYCERIN 0.4 MG/HR TD PT24: 0.4000 mg | MEDICATED_PATCH | Freq: Every day | TRANSDERMAL | 0 refills | Status: DC

## 2024-02-24 NOTE — Progress Notes (Signed)
  Chief Complaint  Patient presents with   Foot Pain    " I seen a doctor a few weeks ago and I had an injection in my right heel and it made the pain worse, and I was told to find a certain kind of shoes but I was not able to find something"     HPI: Patient is 63 y.o. female who presents today for follow-up pain in the right heel.  She relates she had an injection and felt that it made the pain worse a few days after.  She also relates she was unable to get the boot that was recommended.  Relates no improvement with the right heel posterior heel pain.   No Known Allergies  Review of systems is negative except as noted in the HPI.  Denies nausea/ vomiting/ fevers/ chills or night sweats.   Denies difficulty breathing, denies calf pain or tenderness  Physical Exam  Patient is awake, alert, and oriented x 3.  In no acute distress.    Vascular status is intact with palpable pedal pulses DP and PT bilateral and capillary refill time less than 3 seconds bilateral.  No edema or erythema noted.   Neurological exam reveals epicritic and protective sensation grossly intact bilateral.   Dermatological exam reveals skin is supple and dry to bilateral feet.  No open lesions present.    Musculoskeletal exam: Pain on palpation posterior aspect of the Achilles tendon both on the medial and lateral aspect where the Achilles inserts into the calcaneus.  No acute inflammation is noted. X-rays are again reviewed with the patient there is a posterior calcaneal heel spur present.  And Haglund's noted.  Assessment:   ICD-10-CM   1. Achilles tendinitis, right leg  M76.61 Ambulatory referral to Physical Therapy    2. Heel spur, right  M77.31        Plan: Discussed treatment and further recommendations.  Recommended physical therapy and boot immobilization.  A short air fracture walker was dispensed today in office.  Prescription for nitroglycerin patches was also called in and she was given instructions  for use.  Referral for physical therapy at [facility] outpatient rehab on [facility] was also submitted.  She will be seen back in 6 weeks for follow-up.  Discussed this is a very slow process to improve.  She was asking about surgical options and I discussed that she would need to go through physical therapy and boot immobilization before that would be a consideration for her.  She was also concerned about the heel spur growing.  I assured her that these are very slow-growing and between her last visit today's visit there would be no change on xray.  She will be will reassessed at the next visit.

## 2024-02-24 NOTE — Patient Instructions (Signed)
Achilles Tendinitis  Achilles tendinitis is inflammation of the tough, cord-like band that connects the lower leg muscles to the heel bone (Achilles tendon). This is often caused by using the tendon and ankle joint too much. In most cases, Achilles tendinitis gets better over time with treatment and home care. It can take weeks or months to fully heal. What are the causes? This condition may be caused by: A sudden increase in exercise or activity, such as running. Doing the same exercises or activities, such as jumping, over and over. Not warming up your calf muscles before you exercise. Exercising in shoes that are worn out or not made for exercise. Having arthritis or a bone growth (spur) on the back of your heel. This can rub against the tendon and hurt it. Age-related wear and tear. Tendons become less flexible with age and are more likely to be injured. What are the signs or symptoms? Common symptoms of this condition include: Pain in your Achilles tendon or in the back of your leg, just above your heel. The pain may get worse when you exercise. Stiffness or soreness in the back of your leg. You may feel it most often in the morning. Swelling of the skin over the Achilles tendon. Thickening of the tendon. Trouble standing on tiptoe. How is this diagnosed? This condition is diagnosed based on your symptoms and a physical exam. You may also have tests, such as: X-rays. MRI. Ultrasound. How is this treated? The goal of treatment is to relieve symptoms and help your injury heal. You may need to: Decrease or stop activities that caused the tendinitis. You may be told to switch to low-impact exercises like biking or swimming. Ice the injured area. Do physical therapy. This may include strengthening and stretching exercises. Take NSAIDs, such as ibuprofen. These can help with pain and swelling. Use supportive shoes, wraps, heel lifts, or a walking boot (air cast). Have surgery. This may  be done if your symptoms do not get better with other treatments. Use high-energy waves to start the healing process (extracorporeal shock wave therapy). This is rare. Get an injection of medicines that help with inflammation (corticosteroids). This is rare. Follow these instructions at home: If you have an air cast: Wear the air cast as told by your health care provider. Remove it only as told by your provider. Check the skin around the air cast every day. Tell your provider about any concerns. Loosen the air cast if your toes tingle, become numb, or turn cold and blue. Keep the air cast clean. If the air cast is not waterproof: Do not let it get wet. Cover it with a watertight covering when you take a bath or shower. Managing pain, stiffness, and swelling  If told, put ice on the injured area. If you have a removable air cast, remove it as told by your provider. Put ice in a plastic bag. Place a towel between your skin and the bag. Leave the ice on for 20 minutes, 2-3 times a day. If your skin turns bright red, remove the ice right away to prevent skin damage. The risk of damage is higher if you cannot feel pain, heat, or cold. Move your toes often to reduce stiffness and swelling. Raise (elevate) your foot above the level of your heart while you are sitting or lying down. Activity Do not do activities that cause pain. Ask your provider when it is safe to drive if you have an air cast on your foot. If  you go to physical therapy, do exercises as told by your provider or therapist. Return to your normal activities as told by your provider. Ask your provider what activities are safe for you. General instructions Take over-the-counter and prescription medicines only as told by your provider. If told, wrap your foot with an elastic bandage or other wrap. This can help to keep your tendon from moving too much while it heals. Your provider will show you how to wrap your foot. Wear supportive  shoes or heel lifts only as told by your provider. Contact a health care provider if: Your symptoms get worse. Your pain does not get better with medicine. You have new symptoms that you cannot explain. You have warmth and swelling in your foot. You have a fever. Get help right away if: You hear a sudden popping sound in your Achilles tendon and then have severe pain. You cannot move your toes or foot. You cannot put any weight on your foot. Your foot or toes become numb and look white or blue even after you loosen your bandage or air cast. This information is not intended to replace advice given to you by your health care provider. Make sure you discuss any questions you have with your health care provider. Document Revised: 07/25/2022 Document Reviewed: 07/25/2022 Elsevier Patient Education  2024 [employer].

## 2024-03-02 ENCOUNTER — Encounter: Payer: Self-pay | Admitting: Physical Therapy

## 2024-03-02 ENCOUNTER — Other Ambulatory Visit: Payer: Self-pay

## 2024-03-02 ENCOUNTER — Ambulatory Visit: Attending: Podiatrist | Admitting: Physical Therapy

## 2024-03-02 DIAGNOSIS — M6281 Muscle weakness (generalized): Secondary | ICD-10-CM | POA: Diagnosis present

## 2024-03-02 DIAGNOSIS — M25571 Pain in right ankle and joints of right foot: Secondary | ICD-10-CM | POA: Insufficient documentation

## 2024-03-02 DIAGNOSIS — R2689 Other abnormalities of gait and mobility: Secondary | ICD-10-CM | POA: Insufficient documentation

## 2024-03-02 DIAGNOSIS — M7661 Achilles tendinitis, right leg: Secondary | ICD-10-CM | POA: Diagnosis not present

## 2024-03-02 NOTE — Therapy (Signed)
 OUTPATIENT PHYSICAL THERAPY LOWER EXTREMITY EVALUATION  Patient Name: Pam Hart MRN: [id number] DOB:07/03/1961, 63 y.o., female Today's Date: 03/02/2024   PT End of Session - 03/02/24 1135     Visit Number 1    Number of Visits --   1-2x/week   Date for PT Re-Evaluation 04/27/24    Authorization Type [employer] MCD - LEFS    PT Start Time 0940   pt arrived late   PT Stop Time 1015    PT Time Calculation (min) 35 min             Past Medical History:  Diagnosis Date   Anemia    Arthritis    pt requested removal   Hypertension    Thyroid disease    pt requested removal   Past Surgical History:  Procedure Laterality Date   APPENDECTOMY     CARPAL TUNNEL RELEASE     EYE SURGERY Right 11/17/2009   lasix    Patient Active Problem List   Diagnosis Date Noted   Sinus bradycardia 02/03/2020   Pre-syncope 02/02/2020   Right elbow pain 01/05/2017   Right hand pain 01/05/2017   Thyroid nodule 07/29/2016   Neck pain 10/29/2015   Hypertension, uncontrolled 08/22/2015   Chest pain with moderate risk of acute coronary syndrome 08/22/2015   GERD (gastroesophageal reflux disease) 08/22/2015   Hyperlipidemia 03/17/2013   FATIGUE 08/05/2007    PCP: Pcp, No  REFERRING PROVIDER: Orval Blanc, DPM  THERAPY DIAG:  Pain in right ankle and joints of right foot - Plan: PT plan of care cert/re-cert  Muscle weakness - Plan: PT plan of care cert/re-cert  Other abnormalities of gait and mobility - Plan: PT plan of care cert/re-cert  REFERRING DIAG: Achilles tendinitis, right leg [M76.61]   Rationale for Evaluation and Treatment:  Rehabilitation  SUBJECTIVE:  PERTINENT PAST HISTORY:  none        PRECAUTIONS: None  WEIGHT BEARING RESTRICTIONS No  FALLS:  Has patient fallen in last 6 months? No, Number of falls: 0  MOI/History of condition:  Onset date: 1 year  SUBJECTIVE STATEMENT  Pam Hart is a 63 y.o. female who presents to clinic with chief  complaint of insertional achilles pain which as been ongoing for 1 year.  She was recently put in a CAM boot.  She has had injections which were not helpful.   Pain:  Are you having pain? Yes Pain location: posterior R heel pain NPRS scale:  2/10 to 8/10 Aggravating factors: walking, steps Relieving factors: rest Pain description: intermittent Stage: Chronic  Occupation: Teacher, English as a foreign language: NA  Hand Dominance: NA  Patient Goals/Specific Activities: reduce pain   OBJECTIVE:   DIAGNOSTIC FINDINGS:  Calcaneal bone spurring on x-ray  GENERAL OBSERVATION/GAIT:  CAM boot R   PALPATION: TTP medial > lateral achilles insertion with mild edema   LE MMT:  MMT Right (Eval) Left (Eval)  Hip flexion (L2, L3)    Knee extension (L3)    Knee flexion    Hip abduction    Hip extension    Hip external rotation    Hip internal rotation    Hip adduction    Ankle dorsiflexion (L4) 5 4  Ankle plantarflexion (S1)    Ankle inversion 60 60  Ankle eversion 30 30  Great Toe ext (L5)    Grossly     (Blank rows = not tested, score listed is out of 5 possible points.  N = WNL, D =  diminished, C = clear for gross weakness with myotome testing, * = concordant pain with testing)  LE ROM:  ROM Right (Eval) Left (Eval)  Hip flexion    Hip extension    Hip abduction    Hip adduction    Hip internal rotation    Hip external rotation    Knee extension    Knee flexion    Ankle dorsiflexion 4   Ankle plantarflexion Unable to heel raise d/t pain   Ankle inversion 4   Ankle eversion 4    (Blank rows = not tested, N = WNL, * = concordant pain with testing)  Functional Tests  Eval    Progressive balance screen (highest level completed for >/= 10''):  Tandem: R in rear <5'', L in rear <10''                                                          PATIENT SURVEYS:  LEFS: 56/80   TODAY'S TREATMENT: Therapeutic Exercise: Creating, reviewing, and  completing below HEP   PATIENT EDUCATION ([place]/HM):  POC, diagnosis, prognosis, HEP, and outcome measures.  Pt educated via explanation, demonstration, and handout (HEP).  Pt confirms understanding verbally.   HOME EXERCISE PROGRAM: Access Code: [email or web address] URL: [email or web address] Date: 03/02/2024 Prepared by: Lesleigh Rash  Exercises - Seated Toe Towel Scrunches  - 1 x daily - 7 x weekly - 1 sets - 2 reps - 1 minute hold - Seated Ankle Plantar Flexion with Resistance Loop  - 1 x daily - 7 x weekly - 1 sets - 3 reps - 1 minute hold - Ice  - 2-3 x daily - 7 x weekly - 1 sets - 1 reps - 10 min hold  Treatment priorities   Eval        Progressive loading of achilles        footwear        balance                          ASSESSMENT:  CLINICAL IMPRESSION: Keliyah is a 63 y.o. 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  She will benefit from skilled PT to address relevant deficits and improve comfort with ambulation and other daily tasks.  OBJECTIVE IMPAIRMENTS: Pain, bil R>L ankle strength, balance, gait  ACTIVITY LIMITATIONS: standing, walking, bending, lifting, stairs, work  PERSONAL FACTORS: See medical history and pertinent history   REHAB POTENTIAL: Good  CLINICAL DECISION MAKING: Evolving/moderate complexity  EVALUATION COMPLEXITY: Moderate   GOALS:   SHORT TERM GOALS: Target date: 03/30/2024   Jamacia will be >75% HEP compliant to improve carryover between sessions and facilitate independent management of condition  Evaluation: ongoing Goal status: INITIAL   LONG TERM GOALS: Target date: 04/27/2024   Elky will self report >/= 50% decrease in pain from evaluation to improve function in daily tasks  Evaluation/Baseline: 8/10 max pain Goal status: INITIAL   2.  Evann will be able to return to work in normal footwear, not limited by  pain  Evaluation/Baseline: in CAM boot and limited in walking and standing by pain Goal status: INITIAL   3.  Kera will be able to complete R S/L heel raise, not limited  by pain, to show adequate strength no normalize gait  Evaluation/Baseline: limited Goal status: INITIAL   4.  Kymberley will show a >/= 18 pt improvement in LEFS score (MCID is ~11% or 9 pts) as a proxy for functional improvement   Evaluation/Baseline: 56 pts Goal status: INITIAL.    PLAN: PT FREQUENCY: 1-2x/week  PT DURATION: 8 weeks  PLANNED INTERVENTIONS:  [id number]- PT Re-evaluation, 97110-Therapeutic exercises, 97530- Therapeutic activity, [id number]- Neuromuscular re-education, 97535- Self Care, [id number]- Manual therapy, [id number]- Gait training, [id number]- Aquatic Therapy, [phone]- Electrical stimulation (manual), [id number]- Vasopneumatic device, [id number]- Traction (mechanical), [id number]- Ionotophoresis 4mg /ml Dexamethasone, Taping, Dry Needling, Joint manipulation, and Spinal manipulation.   Jjesus Dingley PT, DPT 03/02/2024, 11:38 AM

## 2024-03-10 ENCOUNTER — Ambulatory Visit: Admitting: Physical Therapy

## 2024-03-10 ENCOUNTER — Encounter: Payer: Self-pay | Admitting: Physical Therapy

## 2024-03-10 DIAGNOSIS — M6281 Muscle weakness (generalized): Secondary | ICD-10-CM

## 2024-03-10 DIAGNOSIS — M25571 Pain in right ankle and joints of right foot: Secondary | ICD-10-CM | POA: Diagnosis not present

## 2024-03-10 NOTE — Therapy (Signed)
 OUTPATIENT PHYSICAL THERAPY DAILY NOTE  Patient Name: Pam Hart MRN: [id number] DOB:[birth year], 63 y.o., female Today's Date: 03/10/2024   PT End of Session - 03/10/24 1106     Visit Number 2    Number of Visits --   1-2x/week   Date for PT Re-Evaluation 04/27/24    Authorization Type [employer] MCD - LEFS    PT Start Time 1105    PT Stop Time 1145    PT Time Calculation (min) 40 min             Past Medical History:  Diagnosis Date   Anemia    Arthritis    pt requested removal   Hypertension    Thyroid  disease    pt requested removal   Past Surgical History:  Procedure Laterality Date   APPENDECTOMY     CARPAL TUNNEL RELEASE     EYE SURGERY Right 11/17/2009   lasix    Patient Active Problem List   Diagnosis Date Noted   Sinus bradycardia 02/03/2020   Pre-syncope 02/02/2020   Right elbow pain 01/05/2017   Right hand pain 01/05/2017   Thyroid  nodule 07/29/2016   Neck pain 10/29/2015   Hypertension, uncontrolled 08/22/2015   Chest pain with moderate risk of acute coronary syndrome 08/22/2015   GERD (gastroesophageal reflux disease) 08/22/2015   Hyperlipidemia 03/17/2013   FATIGUE 08/05/2007    PCP: Pcp, No  REFERRING PROVIDER: Orval Blanc, DPM  THERAPY DIAG:  Muscle weakness  REFERRING DIAG: Achilles tendinitis, right leg [M76.61]   Rationale for Evaluation and Treatment:  Rehabilitation  SUBJECTIVE:  PERTINENT PAST HISTORY:  none        PRECAUTIONS: None  WEIGHT BEARING RESTRICTIONS No  FALLS:  Has patient fallen in last 6 months? No, Number of falls: 0  MOI/History of condition:  Onset date: 1 year  SUBJECTIVE STATEMENT  03/10/2024:  Pt reports that she continues to have pain.   EVAL: Pam Hart is a 63 y.o. female who presents to clinic with chief complaint of insertional achilles pain which as been ongoing for 1 year.  She was recently put in a CAM boot.  She has had injections which were not helpful.   Pain:  Are you  having pain? Yes Pain location: posterior R heel pain NPRS scale:  2/10 to 8/10 Aggravating factors: walking, steps Relieving factors: rest Pain description: intermittent Stage: Chronic  Occupation: Teacher, English as a foreign language: NA  Hand Dominance: NA  Patient Goals/Specific Activities: reduce pain   OBJECTIVE:   DIAGNOSTIC FINDINGS:  Calcaneal bone spurring on x-ray  GENERAL OBSERVATION/GAIT:  CAM boot R   PALPATION: TTP medial > lateral achilles insertion with mild edema   LE MMT:  MMT Right (Eval) Left (Eval)  Hip flexion (L2, L3)    Knee extension (L3)    Knee flexion    Hip abduction    Hip extension    Hip external rotation    Hip internal rotation    Hip adduction    Ankle dorsiflexion (L4) 5 4  Ankle plantarflexion (S1)    Ankle inversion 60 60  Ankle eversion 30 30  Great Toe ext (L5)    Grossly     (Blank rows = not tested, score listed is out of 5 possible points.  N = WNL, D = diminished, C = clear for gross weakness with myotome testing, * = concordant pain with testing)  LE ROM:  ROM Right (Eval) Left (Eval)  Hip flexion  Hip extension    Hip abduction    Hip adduction    Hip internal rotation    Hip external rotation    Knee extension    Knee flexion    Ankle dorsiflexion 4   Ankle plantarflexion Unable to heel raise d/t pain   Ankle inversion 4   Ankle eversion 4    (Blank rows = not tested, N = WNL, * = concordant pain with testing)  Functional Tests  Eval    Progressive balance screen (highest level completed for >/= 10''):  Tandem: R in rear <5'', L in rear <10''                                                          PATIENT SURVEYS:  LEFS: 56/80   TODAY'S TREATMENT:  [facility] Adult PT Treatment  03/10/2024:  Therapeutic Exercise:  Bike - 5 min BAPS board L3 all directions  PF hold with 2 weight plates - 1' x3 Octotaping for edema control Semi tandem stance at [employer] regarding  acceptable and pain levels and necessity of loading    HOME EXERCISE PROGRAM: Access Code: [email or web address] URL: [email or web address] Date: 03/02/2024 Prepared by: Lesleigh Rash  Exercises - Seated Toe Towel Scrunches  - 1 x daily - 7 x weekly - 1 sets - 2 reps - 1 minute hold - Seated Ankle Plantar Flexion with Resistance Loop  - 1 x daily - 7 x weekly - 1 sets - 3 reps - 1 minute hold - Ice  - 2-3 x daily - 7 x weekly - 1 sets - 1 reps - 10 min hold  Treatment priorities   Eval        Progressive loading of achilles        footwear        balance                          ASSESSMENT:  CLINICAL IMPRESSION:  03/10/2024: Pam Hart tolerated session well with no adverse reaction.  She reports that she has been doing towel crunches but has not reliably completed PF with band.  Discussed the need to progressively load and she is receptive.  Balance deficit bil R>L.  EVAL:  Pam Hart is a 63 y.o. 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  She will benefit from skilled PT to address relevant deficits and improve comfort with ambulation and other daily tasks.  OBJECTIVE IMPAIRMENTS: Pain, bil R>L ankle strength, balance, gait  ACTIVITY LIMITATIONS: standing, walking, bending, lifting, stairs, work  PERSONAL FACTORS: See medical history and pertinent history   REHAB POTENTIAL: Good  CLINICAL DECISION MAKING: Evolving/moderate complexity  EVALUATION COMPLEXITY: Moderate   GOALS:   SHORT TERM GOALS: Target date: 03/30/2024   [name] will be >75% HEP compliant to improve carryover between sessions and facilitate independent management of condition  Evaluation: ongoing Goal status: INITIAL   LONG TERM GOALS: Target date: 04/27/2024   Pam Hart will self report >/= 50% decrease in pain from evaluation to improve function in daily tasks  Evaluation/Baseline: 8/10 max pain Goal status:  INITIAL   2.  Pam Hart will be able to return to work in normal footwear, not limited by pain  Evaluation/Baseline: in CAM boot and limited in walking and standing by pain Goal status: INITIAL   3.  Pam Hart will be able to complete R S/L heel raise, not limited by pain, to show adequate strength no normalize gait  Evaluation/Baseline: limited Goal status: INITIAL   4.  Shanessa will show a >/= 18 pt improvement in LEFS score (MCID is ~11% or 9 pts) as a proxy for functional improvement   Evaluation/Baseline: 56 pts Goal status: INITIAL.    PLAN: PT FREQUENCY: 1-2x/week  PT DURATION: 8 weeks  PLANNED INTERVENTIONS:  97164- PT Re-evaluation, 97110-Therapeutic exercises, 97530- Therapeutic activity, [id number]- Neuromuscular re-education, 97535- Self Care, [id number]- Manual therapy, [id number]- Gait training, [phone]- Aquatic Therapy, [phone]- Electrical stimulation (manual), [id number]- Vasopneumatic device, [id number]- Traction (mechanical), [id number]- Ionotophoresis 4mg /ml Dexamethasone, Taping, Dry Needling, Joint manipulation, and Spinal manipulation.   Lynnann Knudsen PT, DPT 03/10/2024, 12:24 PM

## 2024-03-16 ENCOUNTER — Ambulatory Visit

## 2024-03-16 DIAGNOSIS — M25571 Pain in right ankle and joints of right foot: Secondary | ICD-10-CM | POA: Diagnosis not present

## 2024-03-16 DIAGNOSIS — M6281 Muscle weakness (generalized): Secondary | ICD-10-CM

## 2024-03-16 DIAGNOSIS — R2689 Other abnormalities of gait and mobility: Secondary | ICD-10-CM

## 2024-03-16 NOTE — Therapy (Signed)
 OUTPATIENT PHYSICAL THERAPY DAILY NOTE  Patient Name: Pam Hart MRN: [id number] DOB:Apr 26, 1961, 63 y.o., female Today's Date: 03/16/2024   PT End of Session - 03/16/24 1148     Visit Number 3    Date for PT Re-Evaluation 04/27/24    Authorization Type [employer] MCD - LEFS    PT Start Time 1148    PT Stop Time 1228    PT Time Calculation (min) 40 min    Activity Tolerance Patient tolerated treatment well    Behavior During Therapy [facility] for tasks assessed/performed              Past Medical History:  Diagnosis Date   Anemia    Arthritis    pt requested removal   Hypertension    Thyroid  disease    pt requested removal   Past Surgical History:  Procedure Laterality Date   APPENDECTOMY     CARPAL TUNNEL RELEASE     EYE SURGERY Right 11/17/2009   lasix    Patient Active Problem List   Diagnosis Date Noted   Sinus bradycardia 02/03/2020   Pre-syncope 02/02/2020   Right elbow pain 01/05/2017   Right hand pain 01/05/2017   Thyroid  nodule 07/29/2016   Neck pain 10/29/2015   Hypertension, uncontrolled 08/22/2015   Chest pain with moderate risk of acute coronary syndrome 08/22/2015   GERD (gastroesophageal reflux disease) 08/22/2015   Hyperlipidemia 03/17/2013   FATIGUE 08/05/2007    PCP: Pcp, No  REFERRING PROVIDER: Orval Blanc, DPM  THERAPY DIAG:  Muscle weakness  Pain in right ankle and joints of right foot  Other abnormalities of gait and mobility  REFERRING DIAG: Achilles tendinitis, right leg [M76.61]   Rationale for Evaluation and Treatment:  Rehabilitation  SUBJECTIVE:  PERTINENT PAST HISTORY:  none        PRECAUTIONS: None  WEIGHT BEARING RESTRICTIONS No  FALLS:  Has patient fallen in last 6 months? No, Number of falls: 0  MOI/History of condition:  Onset date: 1 year  SUBJECTIVE STATEMENT  03/16/2024:  Patient reports that she has not noticed any improvement in her symptoms so far. She is not wearing her CAM boot, d/t running  behind, but states that she is normally driving in her CAM boot.   EVAL: Pam Hart is a 63 y.o. female who presents to clinic with chief complaint of insertional achilles pain which as been ongoing for 1 year.  She was recently put in a CAM boot.  She has had injections which were not helpful.   Pain:  Are you having pain? Yes Pain location: posterior R heel pain NPRS scale:  2/10 to 8/10 Aggravating factors: walking, steps Relieving factors: rest Pain description: intermittent Stage: Chronic  Occupation: Teacher, English as a foreign language: NA  Hand Dominance: NA  Patient Goals/Specific Activities: reduce pain   OBJECTIVE:   DIAGNOSTIC FINDINGS:  Calcaneal bone spurring on x-ray  GENERAL OBSERVATION/GAIT:  CAM boot R   PALPATION: TTP medial > lateral achilles insertion with mild edema   LE MMT:  MMT Right (Eval) Left (Eval)  Hip flexion (L2, L3)    Knee extension (L3)    Knee flexion    Hip abduction    Hip extension    Hip external rotation    Hip internal rotation    Hip adduction    Ankle dorsiflexion (L4) 5 4  Ankle plantarflexion (S1)    Ankle inversion 60 60  Ankle eversion 30 30  Great Toe ext (L5)  Grossly     (Blank rows = not tested, score listed is out of 5 possible points.  N = WNL, D = diminished, C = clear for gross weakness with myotome testing, * = concordant pain with testing)  LE ROM:  ROM Right (Eval) Left (Eval)  Hip flexion    Hip extension    Hip abduction    Hip adduction    Hip internal rotation    Hip external rotation    Knee extension    Knee flexion    Ankle dorsiflexion 4   Ankle plantarflexion Unable to heel raise d/t pain   Ankle inversion 4   Ankle eversion 4    (Blank rows = not tested, N = WNL, * = concordant pain with testing)  Functional Tests  Eval    Progressive balance screen (highest level completed for >/= 10''):  Tandem: R in rear <5'', L in rear <10''                                                           PATIENT SURVEYS:  LEFS: 56/80   TODAY'S TREATMENT:  [facility] Adult PT Treatment  03/16/2024:   Bike - 5 min Standing Rocker board DF/PF x 20  Tandem stance at counter 3 x 30 sec each  Feet together on airex pad 2 x 30 sec Marching on airex pad 2 x 10  Seated ankle inversion/eversion towel swipes x 15  Seated toe/towel scrunches  Seated plantar flexion RTB 2 x 15 Seated inversion/eversion RTB, 2 x 10 each  Seated DF RTB, 2 x 10  Seated ankle alphabet x 1 round     HOME EXERCISE PROGRAM: Access Code: [email or web address] URL: [email or web address] Date: 03/02/2024 Prepared by: Lesleigh Rash  Exercises - Seated Toe Towel Scrunches  - 1 x daily - 7 x weekly - 1 sets - 2 reps - 1 minute hold - Seated Ankle Plantar Flexion with Resistance Loop  - 1 x daily - 7 x weekly - 1 sets - 3 reps - 1 minute hold - Ice  - 2-3 x daily - 7 x weekly - 1 sets - 1 reps - 10 min hold  Treatment priorities   Eval        Progressive loading of achilles        footwear        balance                          ASSESSMENT:  CLINICAL IMPRESSION:  03/16/2024: Pam Hart had good tolerance of today's treatment session, which focused on progression of ankle strengthening and standing balance. Patient education provided today regarding prognosis and rehab expectations. We will continue to progress per POC as tolerated, in order to reach established rehab goals.    EVAL:  Pam Hart is a 63 y.o. 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  She will benefit from skilled PT to address relevant deficits and improve comfort with ambulation and other daily tasks.  OBJECTIVE IMPAIRMENTS: Pain, bil R>L ankle strength, balance, gait  ACTIVITY LIMITATIONS: standing, walking, bending, lifting, stairs, work  PERSONAL FACTORS: See medical history and pertinent history   REHAB POTENTIAL:  Good  CLINICAL DECISION MAKING:  Evolving/moderate complexity  EVALUATION COMPLEXITY: Moderate   GOALS:   SHORT TERM GOALS: Target date: 03/30/2024   Pam Hart will be >75% HEP compliant to improve carryover between sessions and facilitate independent management of condition  Evaluation: ongoing Goal status: INITIAL   LONG TERM GOALS: Target date: 04/27/2024   Pam Hart will self report >/= 50% decrease in pain from evaluation to improve function in daily tasks  Evaluation/Baseline: 8/10 max pain Goal status: INITIAL   2.  Pam Hart will be able to return to work in normal footwear, not limited by pain  Evaluation/Baseline: in CAM boot and limited in walking and standing by pain Goal status: INITIAL   3.  Pam Hart will be able to complete R S/L heel raise, not limited by pain, to show adequate strength no normalize gait  Evaluation/Baseline: limited Goal status: INITIAL   4.  Pam Hart will show a >/= 18 pt improvement in LEFS score (MCID is ~11% or 9 pts) as a proxy for functional improvement   Evaluation/Baseline: 56 pts Goal status: INITIAL.    PLAN: PT FREQUENCY: 1-2x/week  PT DURATION: 8 weeks  PLANNED INTERVENTIONS:  97164- PT Re-evaluation, 97110-Therapeutic exercises, 97530- Therapeutic activity, [id number]- Neuromuscular re-education, 97535- Self Care, 21308- Manual therapy, [id number]- Gait training, [phone]- Aquatic Therapy, [phone]- Electrical stimulation (manual), [id number]- Vasopneumatic device, [id number]- Traction (mechanical), [id number]- Ionotophoresis 4mg /ml Dexamethasone, Taping, Dry Needling, Joint manipulation, and Spinal manipulation.   Arlester Bence, PT, DPT  03/16/2024 12:44 PM

## 2024-03-23 ENCOUNTER — Ambulatory Visit

## 2024-03-24 ENCOUNTER — Ambulatory Visit (HOSPITAL_BASED_OUTPATIENT_CLINIC_OR_DEPARTMENT_OTHER): Admitting: Student

## 2024-03-24 ENCOUNTER — Ambulatory Visit (HOSPITAL_BASED_OUTPATIENT_CLINIC_OR_DEPARTMENT_OTHER)

## 2024-03-24 ENCOUNTER — Encounter (HOSPITAL_BASED_OUTPATIENT_CLINIC_OR_DEPARTMENT_OTHER): Payer: Self-pay | Admitting: Student

## 2024-03-24 DIAGNOSIS — M25571 Pain in right ankle and joints of right foot: Secondary | ICD-10-CM | POA: Diagnosis not present

## 2024-03-24 DIAGNOSIS — M7661 Achilles tendinitis, right leg: Secondary | ICD-10-CM

## 2024-03-24 MED ORDER — MELOXICAM 15 MG PO TABS: 15.0000 mg | ORAL_TABLET | Freq: Every day | ORAL | 0 refills | Status: AC

## 2024-03-24 NOTE — Progress Notes (Signed)
 Chief Complaint: Right ankle injury    Discussed the use of AI scribe software for clinical note transcription with the patient, who gave verbal consent to proceed.  History of Present Illness Pam Hart is a 63 year old female who presents with severe ankle pain and swelling. She experiences severe pain and swelling in the back her right ankle, radiating up her leg, exacerbated by a recent incident where a piece of wood hit the back of her ankle. The pain worsens with walking and standing, and she has difficulty moving her foot, particularly when attempting to point her toes down. She has been seen by podiatry for Achilles tendinitis and has previously received a cortisone injection, which did not alleviate her symptoms. She has been using a walking boot and attending physical therapy, which has provided some relief, but the pain and swelling have persisted. She has not been taking any medications for pain or swelling recently.   Surgical History:   None  PMH/PSH/Family History/Social History/Meds/Allergies:    Past Medical History:  Diagnosis Date   Anemia    Arthritis    pt requested removal   Hypertension    Thyroid  disease    pt requested removal   Past Surgical History:  Procedure Laterality Date   APPENDECTOMY     CARPAL TUNNEL RELEASE     EYE SURGERY Right 11/17/2009   lasix    Social History   Socioeconomic History   Marital status: Married    Spouse name: Not on file   Number of children: 4   Years of education: Not on file   Highest education level: Not on file  Occupational History   Occupation: Interpreter  Tobacco Use   Smoking status: Never   Smokeless tobacco: Never  Vaping Use   Vaping status: Never Used  Substance and Sexual Activity   Alcohol use: No   Drug use: No   Sexual activity: Yes    Partners: Male    Birth control/protection: Post-menopausal  Other Topics Concern   Not on file  Social History  Narrative   Not on file   Social Drivers of Health   Financial Resource Strain: Not on file  Food Insecurity: Not on file  Transportation Needs: Not on file  Physical Activity: Not on file  Stress: Not on file  Social Connections: Not on file   Family History  Problem Relation Age of Onset   Hypertension Mother    Heart disease Mother    Other Mother        blood clot   Alzheimer's disease Father    Glaucoma Father    Diabetes Sister    Hypertension Sister    Colon cancer Neg Hx    Colon polyps Neg Hx    Esophageal cancer Neg Hx    Kidney disease Neg Hx    Gallbladder disease Neg Hx    Sleep apnea Neg Hx    No Known Allergies Current Outpatient Medications  Medication Sig Dispense Refill   amLODipine  (NORVASC ) 10 MG tablet Take 1 tablet by mouth daily.     gabapentin  (NEURONTIN ) 300 MG capsule Take 1-2 capsules (300-600 mg total) by mouth at bedtime. 60 capsule 3   meloxicam  (MOBIC ) 7.5 MG tablet Take 1 tablet (7.5 mg total) by mouth daily. Take 1-2 tablets once  daily as needed for pain. 30 tablet 0   methocarbamol  (ROBAXIN ) 500 MG tablet Take 1 tablet (500 mg total) by mouth 2 (two) times daily as needed for muscle spasms. 20 tablet 2   nitroGLYCERIN  (NITRO-DUR ) 0.4 mg/hr patch Place 1 patch (0.4 mg total) onto the skin daily. Ok to cut patches - place at area of pain on achilles  right 30 patch 0   olmesartan-hydrochlorothiazide  (BENICAR HCT) 40-12.5 MG tablet Take 1 tablet by mouth daily.     pantoprazole  (PROTONIX ) 40 MG tablet Take 1 tablet (40 mg total) by mouth daily. Take 30 minutes prior to first meal 30 tablet 0   No current facility-administered medications for this visit.   No results found.  Review of Systems:   A ROS was performed including pertinent positives and negatives as documented in the HPI.  Physical Exam :   Constitutional: NAD and appears stated age Neurological: Alert and oriented Psych: Appropriate affect and cooperative There were no  vitals taken for this visit.   Comprehensive Musculoskeletal Exam:    Tenderness along the distribution of the right Achilles from the insertion on the calcaneus approximately to the musculotendinous junction.  There is some swelling surrounding the distal Achilles without overlying erythema or warmth.  Pain is exacerbated with active ankle dorsiflexion.  Negative Thompson test.  Dorsiflexion and plantarflexion strength difficult to assess due to pain.  Patient ambulating with antalgic gait.  Imaging:   Xray (right ankle 3 views): Continued visualization of posterior calcaneal heel spur with new retrocalcaneal ossification noted that is not seen on prior radiographs   I personally reviewed and interpreted the radiographs.      Assessment & Plan Achilles tendinitis Patient has experienced an increase in pain and swelling of the right Achilles after she states a piece of wood struck the back of her leg earlier today.  She does have a known history of Achilles tendinitis for which she has been working with physical therapy.  She does have a cam boot although she has not been using this.  There is evidence of new retrocalcaneal ossifications, likely intratendinous due to chronic tendinitis versus acutely displaced osteophyte.  Achilles is intact on exam.  I have recommended ice, heel lifts, as well as resuming use of the cam boot.  I will plan to send in a short course of meloxicam  to help with pain and inflammation.  Recommend following back up with podiatry.  She does have a follow-up appointment scheduled next week.     I personally saw and evaluated the patient, and participated in the management and treatment plan.  Sharrell Deck, PA-C Orthopedics

## 2024-03-26 ENCOUNTER — Ambulatory Visit: Attending: Podiatrist

## 2024-03-26 DIAGNOSIS — R2689 Other abnormalities of gait and mobility: Secondary | ICD-10-CM | POA: Insufficient documentation

## 2024-03-26 DIAGNOSIS — M6281 Muscle weakness (generalized): Secondary | ICD-10-CM | POA: Insufficient documentation

## 2024-03-26 DIAGNOSIS — M25571 Pain in right ankle and joints of right foot: Secondary | ICD-10-CM | POA: Diagnosis present
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
## 2024-03-29 ENCOUNTER — Inpatient Hospital Stay: Admission: RE | Admit: 2024-03-29 | Source: Ambulatory Visit

## 2024-03-29 ENCOUNTER — Ambulatory Visit (INDEPENDENT_AMBULATORY_CARE_PROVIDER_SITE_OTHER): Admitting: Podiatry

## 2024-03-29 ENCOUNTER — Ambulatory Visit

## 2024-03-29 ENCOUNTER — Encounter: Payer: Self-pay | Admitting: Podiatry

## 2024-03-29 VITALS — Ht 62.0 in | Wt 151.0 lb

## 2024-03-29 DIAGNOSIS — S86011A Strain of right Achilles tendon, initial encounter: Secondary | ICD-10-CM

## 2024-03-29 NOTE — Patient Instructions (Signed)

## 2024-03-29 NOTE — Progress Notes (Signed)
  Subjective:  Patient ID: Pam Hart, female    DOB: 07-04-1961,  MRN: [id number]  Chief Complaint  Patient presents with   Tendonitis    Rm#1 Patient is here for a follow-up for achilles pain in her right foot. Patient states she has a heel spur in the right foot that is very painful.    63 y.o. female presents with the above complaint. History confirmed with patient.  She returns for follow-up she has been dealing with Achilles tendinitis pain for several months now had an injection in January, was seen in April and placed into a walking boot she says she uses it some but not every day, had a fall 1 week ago and had to go to the ER for this  Objective:  Physical Exam: warm, good capillary refill, no trophic changes or ulcerative lesions, normal DP and PT pulses, normal sensory exam, and right posterior heel edema pain and swelling small fluctuant area able to palpate the medial fibers but centrally and laterally there is a palpable dell.   Radiographs: Multiple views x-ray of right foot taken on 03/27/2024 compared to films on 12/10/2023 shows new proximal calcifications of tendon dorsal to the insertion site  Assessment:   1. Achilles tendon tear, right, initial encounter      Plan:  Patient was evaluated and treated and all questions answered.  I reviewed her progress with her I recommended she utilize the boot only for now.  I reviewed her radiographs from the emergency room on 03/27/2024 and combined with the history of a fall, new pain and swelling and her physical exam which does have a small palpable delve and suspicious of a partial tear or rupture or avulsion injury.  I recommended MRI to evaluate.  Referral for MRI has been placed and ordered stat.  Return in 2 weeks to reevaluate.  We discussed that if there is a full rupture that surgical intervention may be recommended.  Return if symptoms worsen or fail to improve.

## 2024-03-31 ENCOUNTER — Ambulatory Visit

## 2024-03-31 ENCOUNTER — Other Ambulatory Visit: Payer: Self-pay | Admitting: Podiatrist

## 2024-04-01 NOTE — Telephone Encounter (Signed)
 Refill request approved

## 2024-04-02 ENCOUNTER — Ambulatory Visit
Admission: RE | Admit: 2024-04-02 | Discharge: 2024-04-02 | Disposition: A | Discharge status: Home / Self Care | Source: Ambulatory Visit | Attending: Podiatry | Admitting: Podiatry

## 2024-04-02 DIAGNOSIS — S86011A Strain of right Achilles tendon, initial encounter: Secondary | ICD-10-CM

## 2024-04-03 ENCOUNTER — Ambulatory Visit: Payer: Self-pay | Admitting: Podiatry

## 2024-04-04 ENCOUNTER — Encounter: Payer: Self-pay | Admitting: Podiatry

## 2024-04-04 ENCOUNTER — Ambulatory Visit: Admitting: Podiatry

## 2024-04-04 ENCOUNTER — Telehealth: Payer: Self-pay | Admitting: Podiatry

## 2024-04-04 DIAGNOSIS — M7731 Calcaneal spur, right foot: Secondary | ICD-10-CM

## 2024-04-04 DIAGNOSIS — S86011A Strain of right Achilles tendon, initial encounter: Secondary | ICD-10-CM

## 2024-04-04 NOTE — Telephone Encounter (Signed)
 PER THE [facility] PROVIDER PORTAL NO PRIOR AUTH IS REQ FOR CPT CODES 40981, [phone]  [phone]

## 2024-04-04 NOTE — Progress Notes (Signed)
 Chief Complaint  Patient presents with   Foot Pain    Surgical consult. iI there is a full rupture that surgical intervention may be recommended on Right foot. 2 Pain. Non diabetic.    HPI: 63 y.o. female presenting today to review MRI results and surgical consent for Achilles tendon rupture of the right lower extremity.  History of long chronic heel pain to the posterior tubercle of the calcaneus.  She injured her Achilles tendon and MRI was ordered.  Past Medical History:  Diagnosis Date   Anemia    Arthritis    pt requested removal   Hypertension    Thyroid  disease    pt requested removal    Past Surgical History:  Procedure Laterality Date   APPENDECTOMY     CARPAL TUNNEL RELEASE     EYE SURGERY Right 11/17/2009   lasix     No Known Allergies   Physical Exam: General: The patient is alert and oriented x3 in no acute distress.  Dermatology: Skin is warm, dry and supple bilateral lower extremities.   Vascular: Palpable pedal pulses bilaterally. Capillary refill within normal limits.  No appreciable edema.  No erythema.  Neurological: Grossly intact via light touch  Musculoskeletal Exam: Palpable dell noted just cephalad to the posterior tubercle of the calcaneus  DG Ankle Complete Right  03/24/2024:  IMPRESSION: 1. Small plantar and mild-to-moderate posterior calcaneal heel spurs, similar to prior. 2. There are 3 additional new ossicles overlying the Achilles tendon just proximal to the Achilles tendon insertion, centered approximately 2.2 cm proximal to the Achilles tendon insertion and measuring up to 5 mm, additional chronic enthesopathic change.  MR HEEL RIGHT WO CONTRAST 04/02/2024 IMPRESSION: 1. Extremely high-grade partial-thickness tear of the distal Achilles tendon insertion. This involves at least 80% and in some regions 90% of the transverse dimension of the tendon, extending through the lateral border. There is up to 2.5 cm tendon retraction from  the distal most aspect of the expected Achilles tendon insertion at the posterior mid height of the calcaneus. 2. Tiny short-segment partial-thickness tear of the midsubstance of the peroneus brevis tendon at the level of the superior third of the calcaneus. This likely involves a 5 mm length of the tendon. 3. 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  Assessment/Plan of Care: 1.  High-grade partial-thickness tear distal Achilles tendon right 2.  Posterior heel spur right  -Patient evaluated.  Today we discussed surgery in detail to the patient to repair the Achilles tendon and resect the posterior heel spur.  Risk benefits advantages and disadvantages of the procedure were explained in length in detail to the patient.  All patient questions were answered.  No guarantees were expressed or implied.  The patient consents and would like to proceed with surgery -Authorization for surgery was initiated today.  Surgery will consist of posterior heel spur resection right.  Repair of Achilles tendon right -Postoperative recovery course was also explained in detail with patient.  She understands that she will be strictly nonweightbearing approximately 4-6 weeks postoperatively followed by slow progression to weightbearing and physical therapy -Order placed for knee scooter postoperatively -Return to clinic 1 week postop     Dot Gazella, DPM [facility]  Dr. Dot Gazella, DPM    [address]  [place], [place] [address]                Office [phone]  Fax [phone]

## 2024-04-04 NOTE — H&P (View-Only) (Signed)
 Chief Complaint  Patient presents with   Foot Pain    Surgical consult. iI there is a full rupture that surgical intervention may be recommended on Right foot. 2 Pain. Non diabetic.    HPI: 63 y.o. female presenting today to review MRI results and surgical consent for Achilles tendon rupture of the right lower extremity.  History of long chronic heel pain to the posterior tubercle of the calcaneus.  She injured her Achilles tendon and MRI was ordered.  Past Medical History:  Diagnosis Date   Anemia    Arthritis    pt requested removal   Hypertension    Thyroid  disease    pt requested removal    Past Surgical History:  Procedure Laterality Date   APPENDECTOMY     CARPAL TUNNEL RELEASE     EYE SURGERY Right 11/17/2009   lasix     No Known Allergies   Physical Exam: General: The patient is alert and oriented x3 in no acute distress.  Dermatology: Skin is warm, dry and supple bilateral lower extremities.   Vascular: Palpable pedal pulses bilaterally. Capillary refill within normal limits.  No appreciable edema.  No erythema.  Neurological: Grossly intact via light touch  Musculoskeletal Exam: Palpable dell noted just cephalad to the posterior tubercle of the calcaneus  DG Ankle Complete Right  03/24/2024:  IMPRESSION: 1. Small plantar and mild-to-moderate posterior calcaneal heel spurs, similar to prior. 2. There are 3 additional new ossicles overlying the Achilles tendon just proximal to the Achilles tendon insertion, centered approximately 2.2 cm proximal to the Achilles tendon insertion and measuring up to 5 mm, additional chronic enthesopathic change.  MR HEEL RIGHT WO CONTRAST 04/02/2024 IMPRESSION: 1. Extremely high-grade partial-thickness tear of the distal Achilles tendon insertion. This involves at least 80% and in some regions 90% of the transverse dimension of the tendon, extending through the lateral border. There is up to 2.5 cm tendon retraction from  the distal most aspect of the expected Achilles tendon insertion at the posterior mid height of the calcaneus. 2. Tiny short-segment partial-thickness tear of the midsubstance of the peroneus brevis tendon at the level of the superior third of the calcaneus. This likely involves a 5 mm length of the tendon. 3. 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  Assessment/Plan of Care: 1.  High-grade partial-thickness tear distal Achilles tendon right 2.  Posterior heel spur right  -Patient evaluated.  Today we discussed surgery in detail to the patient to repair the Achilles tendon and resect the posterior heel spur.  Risk benefits advantages and disadvantages of the procedure were explained in length in detail to the patient.  All patient questions were answered.  No guarantees were expressed or implied.  The patient consents and would like to proceed with surgery -Authorization for surgery was initiated today.  Surgery will consist of posterior heel spur resection right.  Repair of Achilles tendon right -Postoperative recovery course was also explained in detail with patient.  She understands that she will be strictly nonweightbearing approximately 4-6 weeks postoperatively followed by slow progression to weightbearing and physical therapy -Order placed for knee scooter postoperatively -Return to clinic 1 week postop     Dot Gazella, DPM [facility]  Dr. Dot Gazella, DPM    [address]  [place], [place] [address]                Office [phone]  Fax [phone]

## 2024-04-04 NOTE — Telephone Encounter (Signed)
 DOS: 5.23.25   EFFECTIVE DATE :  11/18/23   DEDUCTIBLE :  $0.00   REMAINING:  $0.00   OOP:  $99,999.00    REMAINING:  $99,979.00    COINSURANCE:   0%     PER THE [facility] PROVIDER PORTAL NO PRIOR AUTH IS REQ FOR CPT CODES 40981, [phone]       WGN#[id number]

## 2024-04-07 ENCOUNTER — Encounter
Admission: RE | Admit: 2024-04-07 | Discharge: 2024-04-07 | Disposition: A | Discharge status: Home / Self Care | Source: Ambulatory Visit | Attending: Podiatry | Admitting: Podiatry

## 2024-04-07 ENCOUNTER — Other Ambulatory Visit: Payer: Self-pay

## 2024-04-07 VITALS — Ht 62.0 in | Wt 152.0 lb

## 2024-04-07 DIAGNOSIS — R55 Syncope and collapse: Secondary | ICD-10-CM

## 2024-04-07 DIAGNOSIS — I10 Essential (primary) hypertension: Secondary | ICD-10-CM

## 2024-04-07 DIAGNOSIS — R001 Bradycardia, unspecified: Secondary | ICD-10-CM

## 2024-04-07 DIAGNOSIS — R079 Chest pain, unspecified: Secondary | ICD-10-CM

## 2024-04-07 DIAGNOSIS — Z79899 Other long term (current) drug therapy: Secondary | ICD-10-CM

## 2024-04-07 DIAGNOSIS — E785 Hyperlipidemia, unspecified: Secondary | ICD-10-CM

## 2024-04-07 HISTORY — DX: Gastro-esophageal reflux disease without esophagitis: K21.9

## 2024-04-07 HISTORY — DX: Fatty (change of) liver, not elsewhere classified: K76.0

## 2024-04-07 MED ORDER — CEFAZOLIN SODIUM-DEXTROSE 2-4 GM/100ML-% IV SOLN
2.0000 g | INTRAVENOUS | Status: AC
  Administered 2024-04-08: 2 g via INTRAVENOUS

## 2024-04-07 MED ORDER — CHLORHEXIDINE GLUCONATE 0.12 % MT SOLN
15.0000 mL | Freq: Once | OROMUCOSAL | Status: AC
  Administered 2024-04-08: 15 mL via OROMUCOSAL

## 2024-04-07 MED ORDER — LACTATED RINGERS IV SOLN: INTRAVENOUS | Status: DC

## 2024-04-07 MED ORDER — ORAL CARE MOUTH RINSE: 15.0000 mL | Freq: Once | OROMUCOSAL | Status: AC

## 2024-04-07 NOTE — Patient Instructions (Addendum)
 Your procedure is scheduled on: Friday 04/08/24 To find out your arrival time, please call [phone] between 1PM - 3PM on:  Thursday 04/07/24  Report to the Registration Desk on the 1st floor of the [facility]. Arrive at 1 pm as instructed Free Valet parking is available.  If your arrival time is 6:00 am, do not arrive before that time as the [facility] entrance doors do not open until 6:00 am.  REMEMBER: Instructions that are not followed completely may result in serious medical risk, up to and including death; or upon the discretion of your surgeon and anesthesiologist your surgery may need to be rescheduled.  Do not eat food or drink any liquids after midnight the night before surgery.  No gum chewing or hard candies.  One week prior to surgery: Stop Anti-inflammatories (NSAIDS) such as Advil, Aleve , Ibuprofen, Motrin, Naproxen , Naprosyn  and Aspirin  based products such as Excedrin, Goody's Powder, BC Powder. You may however, continue to take Tylenol  if needed for pain up until the day of surgery.  Stop ANY OVER THE COUNTER supplements and vitamins until after surgery.  TAKE ONLY THESE MEDICATIONS THE MORNING OF SURGERY WITH A SIP OF WATER:  amLODipine  (NORVASC ) 10 MG tablet  pantoprazole  (PROTONIX ) 40 MG tablet   No Alcohol for 24 hours before or after surgery.  No Smoking including e-cigarettes for 24 hours before surgery.  No chewable tobacco products for at least 6 hours before surgery.  No nicotine patches on the day of surgery.  Do not use any "recreational" drugs for at least a week (preferably 2 weeks) before your surgery.  Please be advised that the combination of cocaine and anesthesia may have negative outcomes, up to and including death. If you test positive for cocaine, your surgery will be cancelled.  On the morning of surgery brush your teeth with toothpaste and water, you may rinse your mouth with mouthwash if you wish. Do not swallow any toothpaste or  mouthwash.  Shower and use the scrub provided by Dr Luster Salters  Do not wear lotions, powders, or perfumes.   Do not shave body hair from the neck down 48 hours before surgery.  Wear comfortable clothing (specific to your surgery type) to the hospital.  Do not wear jewelry, make-up, hairpins, clips or nail polish.  For welded (permanent) jewelry: bracelets, anklets, waist bands, etc.  Please have this removed prior to surgery.  If it is not removed, there is a chance that hospital personnel will need to cut it off on the day of surgery. Contact lenses, hearing aids and dentures may not be worn into surgery. Bring a cse for your glasses  Do not bring valuables to the hospital. [facility] is not responsible for any missing/lost belongings or valuables.   Notify your doctor if there is any change in your medical condition (cold, fever, infection).  If you are being discharged the day of surgery, you will not be allowed to drive home. You will need a responsible individual to drive you home and stay with you for 24 hours after surgery.   If you are taking public transportation, you will need to have a responsible individual with you.  After surgery, you can help prevent lung complications by doing breathing exercises.  Take deep breaths and cough every 1-2 hours. Your doctor may order a device called an Incentive Spirometer to help you take deep breaths.  Surgery Visitation Policy:  Patients undergoing a surgery or procedure may have two family members or  support persons with them as long as the person is not COVID-19 positive or experiencing its symptoms.   Please call the Pre-admissions Testing Dept. at [phone] if you have any questions about these instructions.

## 2024-04-08 ENCOUNTER — Ambulatory Visit
Admission: RE | Admit: 2024-04-08 | Discharge: 2024-04-08 | Disposition: A | Discharge status: Home / Self Care | Attending: Podiatry | Admitting: Podiatry

## 2024-04-08 ENCOUNTER — Ambulatory Visit: Admitting: Anesthesiology

## 2024-04-08 ENCOUNTER — Encounter: Payer: Self-pay | Admitting: Podiatry

## 2024-04-08 ENCOUNTER — Encounter
Admission: RE | Disposition: A | Discharge status: Home / Self Care | Payer: Self-pay | Source: Home / Self Care | Attending: Podiatry

## 2024-04-08 ENCOUNTER — Other Ambulatory Visit: Payer: Self-pay

## 2024-04-08 ENCOUNTER — Ambulatory Visit

## 2024-04-08 DIAGNOSIS — S86011A Strain of right Achilles tendon, initial encounter: Secondary | ICD-10-CM | POA: Insufficient documentation

## 2024-04-08 DIAGNOSIS — I10 Essential (primary) hypertension: Secondary | ICD-10-CM | POA: Diagnosis not present

## 2024-04-08 DIAGNOSIS — R55 Syncope and collapse: Secondary | ICD-10-CM

## 2024-04-08 DIAGNOSIS — E785 Hyperlipidemia, unspecified: Secondary | ICD-10-CM

## 2024-04-08 DIAGNOSIS — X58XXXA Exposure to other specified factors, initial encounter: Secondary | ICD-10-CM | POA: Diagnosis not present

## 2024-04-08 DIAGNOSIS — M7731 Calcaneal spur, right foot: Secondary | ICD-10-CM | POA: Diagnosis present

## 2024-04-08 DIAGNOSIS — K219 Gastro-esophageal reflux disease without esophagitis: Secondary | ICD-10-CM | POA: Diagnosis not present

## 2024-04-08 DIAGNOSIS — R001 Bradycardia, unspecified: Secondary | ICD-10-CM

## 2024-04-08 DIAGNOSIS — M216X1 Other acquired deformities of right foot: Secondary | ICD-10-CM | POA: Diagnosis not present

## 2024-04-08 DIAGNOSIS — Z79899 Other long term (current) drug therapy: Secondary | ICD-10-CM

## 2024-04-08 DIAGNOSIS — R079 Chest pain, unspecified: Secondary | ICD-10-CM

## 2024-04-08 HISTORY — PX: ACHILLES TENDON SURGERY: SHX542

## 2024-04-08 HISTORY — PX: HEEL SPUR RESECTION: SHX6410

## 2024-04-08 SURGERY — EXCISION, BONE SPUR, CALCANEUS
Anesthesia: General | Site: Heel | Laterality: Right

## 2024-04-08 MED ORDER — ONDANSETRON HCL 4 MG/2ML IJ SOLN
INTRAMUSCULAR | Status: DC | PRN
  Administered 2024-04-08: 4 mg via INTRAVENOUS

## 2024-04-08 MED ORDER — LIDOCAINE HCL (PF) 2 % IJ SOLN
INTRAMUSCULAR | Status: AC
  Filled 2024-04-08: qty 5

## 2024-04-08 MED ORDER — BUPIVACAINE HCL (PF) 0.5 % IJ SOLN
INTRAMUSCULAR | Status: AC
  Filled 2024-04-08: qty 30

## 2024-04-08 MED ORDER — CELECOXIB 200 MG PO CAPS
ORAL_CAPSULE | ORAL | Status: AC
  Filled 2024-04-08: qty 1

## 2024-04-08 MED ORDER — LIDOCAINE HCL (CARDIAC) PF 100 MG/5ML IV SOSY
PREFILLED_SYRINGE | INTRAVENOUS | Status: DC | PRN
  Administered 2024-04-08: 100 mg via INTRAVENOUS

## 2024-04-08 MED ORDER — DEXAMETHASONE SODIUM PHOSPHATE 10 MG/ML IJ SOLN
INTRAMUSCULAR | Status: DC | PRN
  Administered 2024-04-08: 10 mg via INTRAVENOUS

## 2024-04-08 MED ORDER — CHLORHEXIDINE GLUCONATE 0.12 % MT SOLN
OROMUCOSAL | Status: AC
  Filled 2024-04-08: qty 15

## 2024-04-08 MED ORDER — SODIUM CHLORIDE FLUSH 0.9 % IV SOLN
INTRAVENOUS | Status: AC
  Filled 2024-04-08: qty 10

## 2024-04-08 MED ORDER — LIDOCAINE-EPINEPHRINE (PF) 1 %-1:200000 IJ SOLN
INTRAMUSCULAR | Status: AC
  Filled 2024-04-08: qty 30

## 2024-04-08 MED ORDER — BUPIVACAINE HCL (PF) 0.5 % IJ SOLN
INTRAMUSCULAR | Status: DC | PRN
  Administered 2024-04-08: 20 mL

## 2024-04-08 MED ORDER — OXYCODONE HCL 5 MG PO TABS
5.0000 mg | ORAL_TABLET | Freq: Once | ORAL | Status: AC | PRN
  Administered 2024-04-08: 5 mg via ORAL

## 2024-04-08 MED ORDER — 0.9 % SODIUM CHLORIDE (POUR BTL) OPTIME
TOPICAL | Status: DC | PRN
  Administered 2024-04-08: 500 mL

## 2024-04-08 MED ORDER — ONDANSETRON HCL 4 MG/2ML IJ SOLN
INTRAMUSCULAR | Status: AC
  Filled 2024-04-08: qty 2

## 2024-04-08 MED ORDER — DROPERIDOL 2.5 MG/ML IJ SOLN: 0.6250 mg | Freq: Once | INTRAMUSCULAR | Status: DC | PRN

## 2024-04-08 MED ORDER — LIDOCAINE HCL (PF) 1 % IJ SOLN
INTRAMUSCULAR | Status: AC
  Filled 2024-04-08: qty 30

## 2024-04-08 MED ORDER — CEFAZOLIN SODIUM-DEXTROSE 2-4 GM/100ML-% IV SOLN
INTRAVENOUS | Status: AC
  Filled 2024-04-08: qty 100

## 2024-04-08 MED ORDER — PHENYLEPHRINE 80 MCG/ML (10ML) SYRINGE FOR IV PUSH (FOR BLOOD PRESSURE SUPPORT)
PREFILLED_SYRINGE | INTRAVENOUS | Status: DC | PRN
  Administered 2024-04-08 (×3): 80 ug via INTRAVENOUS

## 2024-04-08 MED ORDER — MIDAZOLAM HCL 2 MG/2ML IJ SOLN
INTRAMUSCULAR | Status: AC
  Filled 2024-04-08: qty 2

## 2024-04-08 MED ORDER — FENTANYL CITRATE (PF) 100 MCG/2ML IJ SOLN
INTRAMUSCULAR | Status: AC
  Filled 2024-04-08: qty 2

## 2024-04-08 MED ORDER — PHENYLEPHRINE 80 MCG/ML (10ML) SYRINGE FOR IV PUSH (FOR BLOOD PRESSURE SUPPORT)
PREFILLED_SYRINGE | INTRAVENOUS | Status: AC
  Filled 2024-04-08: qty 10

## 2024-04-08 MED ORDER — OXYCODONE HCL 5 MG PO TABS
ORAL_TABLET | ORAL | Status: AC
  Filled 2024-04-08: qty 1

## 2024-04-08 MED ORDER — CELECOXIB 200 MG PO CAPS
200.0000 mg | ORAL_CAPSULE | Freq: Once | ORAL | Status: AC
  Administered 2024-04-08: 200 mg via ORAL

## 2024-04-08 MED ORDER — ACETAMINOPHEN 500 MG PO TABS
ORAL_TABLET | ORAL | Status: AC
  Filled 2024-04-08: qty 2

## 2024-04-08 MED ORDER — EPHEDRINE SULFATE-NACL 50-0.9 MG/10ML-% IV SOSY
PREFILLED_SYRINGE | INTRAVENOUS | Status: DC | PRN
Start: 2024-04-08 — End: 2024-04-08
  Administered 2024-04-08 (×3): 5 mg via INTRAVENOUS

## 2024-04-08 MED ORDER — PROPOFOL 10 MG/ML IV BOLUS
INTRAVENOUS | Status: AC
  Filled 2024-04-08: qty 20

## 2024-04-08 MED ORDER — GABAPENTIN 300 MG PO CAPS
300.0000 mg | ORAL_CAPSULE | Freq: Once | ORAL | Status: AC
  Administered 2024-04-08: 300 mg via ORAL

## 2024-04-08 MED ORDER — FENTANYL CITRATE (PF) 100 MCG/2ML IJ SOLN
INTRAMUSCULAR | Status: DC | PRN
  Administered 2024-04-08 (×2): 50 ug via INTRAVENOUS

## 2024-04-08 MED ORDER — EPHEDRINE 5 MG/ML INJ
INTRAVENOUS | Status: AC
  Filled 2024-04-08: qty 5

## 2024-04-08 MED ORDER — MIDAZOLAM HCL 2 MG/2ML IJ SOLN
INTRAMUSCULAR | Status: DC | PRN
  Administered 2024-04-08: 2 mg via INTRAVENOUS

## 2024-04-08 MED ORDER — ACETAMINOPHEN 10 MG/ML IV SOLN: 1000.0000 mg | Freq: Once | INTRAVENOUS | Status: DC | PRN

## 2024-04-08 MED ORDER — SUGAMMADEX SODIUM 200 MG/2ML IV SOLN
INTRAVENOUS | Status: DC | PRN
  Administered 2024-04-08: 150 mg via INTRAVENOUS

## 2024-04-08 MED ORDER — GABAPENTIN 300 MG PO CAPS
ORAL_CAPSULE | ORAL | Status: AC
  Filled 2024-04-08: qty 1

## 2024-04-08 MED ORDER — OXYCODONE-ACETAMINOPHEN 5-325 MG PO TABS: 1.0000 | ORAL_TABLET | ORAL | 0 refills | Status: DC | PRN

## 2024-04-08 MED ORDER — FENTANYL CITRATE (PF) 100 MCG/2ML IJ SOLN
25.0000 ug | INTRAMUSCULAR | Status: DC | PRN
  Administered 2024-04-08 (×2): 25 ug via INTRAVENOUS

## 2024-04-08 MED ORDER — LACTATED RINGERS IV SOLN: INTRAVENOUS | Status: DC | PRN

## 2024-04-08 MED ORDER — PROPOFOL 10 MG/ML IV BOLUS
INTRAVENOUS | Status: DC | PRN
  Administered 2024-04-08: 140 mg via INTRAVENOUS

## 2024-04-08 MED ORDER — ROCURONIUM BROMIDE 100 MG/10ML IV SOLN
INTRAVENOUS | Status: DC | PRN
  Administered 2024-04-08: 50 mg via INTRAVENOUS

## 2024-04-08 MED ORDER — ACETAMINOPHEN 500 MG PO TABS
1000.0000 mg | ORAL_TABLET | Freq: Once | ORAL | Status: AC
  Administered 2024-04-08: 1000 mg via ORAL

## 2024-04-08 MED ORDER — OXYCODONE HCL 5 MG/5ML PO SOLN: 5.0000 mg | Freq: Once | ORAL | Status: AC | PRN

## 2024-04-08 SURGICAL SUPPLY — 39 items
ANCHOR YKNOT TRUSHOT 1.8 F/A (Anchor) IMPLANT
BLADE MED AGGRESSIVE (BLADE) IMPLANT
BNDG ELASTIC 4INX 5YD STR LF (GAUZE/BANDAGES/DRESSINGS) ×3 IMPLANT
BNDG ELASTIC 6INX 5YD STR LF (GAUZE/BANDAGES/DRESSINGS) ×3 IMPLANT
BNDG ESMARCH 4X12 STRL LF (GAUZE/BANDAGES/DRESSINGS) ×3 IMPLANT
BNDG GAUZE DERMACEA FLUFF 4 (GAUZE/BANDAGES/DRESSINGS) ×3 IMPLANT
COVER LIGHT HANDLE STERIS (MISCELLANEOUS) ×6 IMPLANT
CUFF TRNQT CYL 24X4X16.5-23 (TOURNIQUET CUFF) IMPLANT
DRAPE U-SHAPE 47X51 STRL (DRAPES) ×3 IMPLANT
DURAPREP 26ML APPLICATOR (WOUND CARE) ×3 IMPLANT
GAUZE PAD ABD 8X10 STRL (GAUZE/BANDAGES/DRESSINGS) ×3 IMPLANT
GAUZE SPONGE 4X4 12PLY STRL (GAUZE/BANDAGES/DRESSINGS) ×3 IMPLANT
GAUZE XEROFORM 1X8 LF (GAUZE/BANDAGES/DRESSINGS) ×3 IMPLANT
GLOVE BIO SURGEON STRL SZ8 (GLOVE) ×3 IMPLANT
GLOVE BIOGEL PI IND STRL 8 (GLOVE) ×3 IMPLANT
GOWN STRL REUS W/ TWL XL LVL3 (GOWN DISPOSABLE) ×3 IMPLANT
IMPL TENDON SHEET TENOGLID 2X2 IMPLANT
NDL HYPO 22X1.5 SAFETY MO (MISCELLANEOUS) ×3 IMPLANT
NEEDLE HYPO 22X1.5 SAFETY MO (MISCELLANEOUS) ×2 IMPLANT
NS IRRIG 1000ML POUR BTL (IV SOLUTION) ×3 IMPLANT
PACK BASIN MAJOR ARMC (MISCELLANEOUS) ×3 IMPLANT
PACK EXTREMITY ARMC (MISCELLANEOUS) ×3 IMPLANT
PENCIL SMOKE EVACUATOR (MISCELLANEOUS) ×3 IMPLANT
RASP SM TEAR CROSS CUT (RASP) IMPLANT
SOLUTION PREP PVP 2OZ (MISCELLANEOUS) ×6 IMPLANT
SPIKE FLUID TRANSFER (MISCELLANEOUS) ×3 IMPLANT
SPONGE T-LAP 4X18 ~~LOC~~+RFID (SPONGE) ×3 IMPLANT
STAPLER SKIN PROX 35W (STAPLE) ×3 IMPLANT
STOCKINETTE IMPERV 14X48 (MISCELLANEOUS) ×3 IMPLANT
STRIP CLOSURE SKIN 1/2X4 (GAUZE/BANDAGES/DRESSINGS) IMPLANT
SUCTION TUBE FRAZIER 10FR DISP (SUCTIONS) ×3 IMPLANT
SUT PROLENE 3 0 PS 2 (SUTURE) IMPLANT
SUT PROLENE 4 0 PS 2 18 (SUTURE) IMPLANT
SUT VIC AB 2-0 SH 27XBRD (SUTURE) IMPLANT
SUT VIC AB 3-0 PS2 18XBRD (SUTURE) ×3 IMPLANT
SUTURE MNCRL 4-0 27XMF (SUTURE) IMPLANT
SUTURE VICRYL 4-0 27 PS-2 BART (SUTURE) ×3 IMPLANT
TAPE CAST 4X4 WHT DELT (MISCELLANEOUS) IMPLANT
TRAP FLUID SMOKE EVACUATOR (MISCELLANEOUS) ×3 IMPLANT

## 2024-04-08 NOTE — Transfer of Care (Signed)
 Immediate Anesthesia Transfer of Care Note  Patient: Pam Hart  Procedure(s) Performed: EXCISION, BONE SPUR, CALCANEUS (Right: Heel) REPAIR, TENDON, ACHILLES (Right)  Patient Location: PACU  Anesthesia Type:General  Level of Consciousness: drowsy and patient cooperative  Airway & Oxygen Therapy: Patient Spontanous Breathing and Patient connected to face mask oxygen  Post-op Assessment: Report given to PACU RN  Post vital signs: Reviewed and stable  Last Vitals:  Vitals Value Taken Time  BP 133/63 04/08/2024 1649  Temp    Pulse 69   Resp 18   SpO2 100     Last Pain:  Vitals:   04/08/24 1331  TempSrc: Temporal  PainSc:       Patients Stated Pain Goal: 0 (04/08/24 1315)  Complications: No notable events documented.

## 2024-04-08 NOTE — Anesthesia Postprocedure Evaluation (Signed)
 Anesthesia Post Note  Patient: Pam Hart  Procedure(s) Performed: EXCISION, BONE SPUR, CALCANEUS (Right: Heel) REPAIR, TENDON, ACHILLES (Right)  Patient location during evaluation: PACU Anesthesia Type: General Level of consciousness: awake and alert, oriented and patient cooperative Pain management: pain level controlled Vital Signs Assessment: post-procedure vital signs reviewed and stable Respiratory status: spontaneous breathing, nonlabored ventilation and respiratory function stable Cardiovascular status: blood pressure returned to baseline and stable Postop Assessment: adequate PO intake Anesthetic complications: no   No notable events documented.   Last Vitals:  Vitals:   04/08/24 1720 04/08/24 1730  BP:  136/67  Pulse:  63  Resp: (!) 21 16  Temp:    SpO2:  94%    Last Pain:  Vitals:   04/08/24 1720  TempSrc:   PainSc: 7                  Dorothey Gate

## 2024-04-08 NOTE — Interval H&P Note (Signed)
 History and Physical Interval Note:  04/08/2024 2:35 PM  Pam Hart  has presented today for surgery, with the diagnosis of ACHILLES TENDON RUPTURE HEEL SPUR RIGHT.  The various methods of treatment have been discussed with the patient and family. After consideration of risks, benefits and other options for treatment, the patient has consented to  Procedure(s): EXCISION, BONE SPUR, CALCANEUS (Right) REPAIR, TENDON, ACHILLES (Right) as a surgical intervention.  The patient's history has been reviewed, patient examined, no change in status, stable for surgery.  I have reviewed the patient's chart and labs.  Questions were answered to the patient's satisfaction.     Dot Gazella

## 2024-04-08 NOTE — Anesthesia Preprocedure Evaluation (Addendum)
 Anesthesia Evaluation  Patient identified by MRN, date of birth, ID band Patient awake    Reviewed: Allergy & Precautions, H&P , NPO status , Patient's Chart, lab work & pertinent test results  Airway Mallampati: II  TM Distance: >3 FB Neck ROM: full    Dental no notable dental hx.    Pulmonary neg pulmonary ROS   Pulmonary exam normal        Cardiovascular hypertension, Normal cardiovascular exam     Neuro/Psych negative neurological ROS  negative psych ROS   GI/Hepatic Neg liver ROS,GERD  ,,  Endo/Other  negative endocrine ROS    Renal/GU      Musculoskeletal   Abdominal   Peds  Hematology negative hematology ROS (+)   Anesthesia Other Findings Past Medical History: No date: Anemia No date: Arthritis     Comment:  pt requested removal No date: Fatty liver No date: GERD (gastroesophageal reflux disease) No date: Hypertension No date: Thyroid  disease     Comment:  pt requested removal  Past Surgical History: No date: APPENDECTOMY No date: CARPAL TUNNEL RELEASE; Bilateral 11/17/2009: EYE SURGERY; Right     Comment:  lasix   BMI    Body Mass Index: 27.80 kg/m      Reproductive/Obstetrics negative OB ROS                             Anesthesia Physical Anesthesia Plan  ASA: 2  Anesthesia Plan: General LMA   Post-op Pain Management: Tylenol  PO (pre-op)*, Gabapentin  PO (pre-op)* and Celebrex  PO (pre-op)*   Induction: Intravenous  PONV Risk Score and Plan: Dexamethasone, Ondansetron , Midazolam and Treatment may vary due to age or medical condition  Airway Management Planned: LMA  Additional Equipment:   Intra-op Plan:   Post-operative Plan: Extubation in OR  Informed Consent: I have reviewed the patients History and Physical, chart, labs and discussed the procedure including the risks, benefits and alternatives for the proposed anesthesia with the patient or  authorized representative who has indicated his/her understanding and acceptance.     Dental Advisory Given  Plan Discussed with: Anesthesiologist, CRNA and Surgeon  Anesthesia Plan Comments: (Prn post op block)        Anesthesia Quick Evaluation

## 2024-04-08 NOTE — Anesthesia Procedure Notes (Signed)
 Procedure Name: Intubation Date/Time: 04/08/2024 2:53 PM  Performed by: Orin Birk, CRNAPre-anesthesia Checklist: Patient identified, Emergency Drugs available, Suction available and Patient being monitored Patient Re-evaluated:Patient Re-evaluated prior to induction Oxygen Delivery Method: Circle system utilized Preoxygenation: Pre-oxygenation with 100% oxygen Induction Type: IV induction Ventilation: Mask ventilation without difficulty Laryngoscope Size: McGrath and 3 Grade View: Grade I Tube type: Oral Tube size: 6.5 mm Number of attempts: 1 Airway Equipment and Method: Stylet and Video-laryngoscopy Placement Confirmation: ETT inserted through vocal cords under direct vision, positive ETCO2 and breath sounds checked- equal and bilateral Secured at: 21 cm Tube secured with: Tape Dental Injury: Teeth and Oropharynx as per pre-operative assessment

## 2024-04-08 NOTE — Brief Op Note (Signed)
 04/08/2024  4:44 PM  PATIENT:  Pam Hart  63 y.o. female  PRE-OPERATIVE DIAGNOSIS:  ACHILLES TENDON RUPTURE HEEL SPUR RIGHT  POST-OPERATIVE DIAGNOSIS:  ACHILLES TENDON RUPTUREHEEL SPUR RIGHT  PROCEDURE:  Procedure(s): EXCISION, BONE SPUR, CALCANEUS (Right) REPAIR, TENDON, ACHILLES (Right)  SURGEON:  Surgeons and Role:    Dot Gazella, DPM - Primary  PHYSICIAN ASSISTANT: None  ASSISTANTS: none   ANESTHESIA:   general  EBL:  5 mL   BLOOD ADMINISTERED:none  DRAINS: none   LOCAL MEDICATIONS USED:  MARCAINE    and Amount: 20 ml  SPECIMEN:  No Specimen  DISPOSITION OF SPECIMEN:  N/A  COUNTS:  YES  TOURNIQUET:   Total Tourniquet Time Documented: Thigh (Right) - 75 minutes Total: Thigh (Right) - 75 minutes   DICTATION: .Dotti Gear Dictation  PLAN OF CARE: Discharge to home after PACU  PATIENT DISPOSITION:  PACU - hemodynamically stable.   Delay start of Pharmacological VTE agent (>24hrs) due to surgical blood loss or risk of bleeding: no  Dot Gazella, DPM [facility]  Dr. Dot Gazella, DPM    [address] [place], [place] [address]                Office [phone]  Fax [phone]

## 2024-04-08 NOTE — Op Note (Signed)
 OPERATIVE REPORT Patient name: Pam Hart MRN: [id number] DOB: [birth year]  DOS: 04/08/24  Preop Dx: Achilles tendon rupture right.  Posterior heel spur right Postop Dx: same  Procedure:  1.  Resection of posterior heel spur right 2.  Primary repair of Achilles tendon rupture right  Surgeon: Dot Gazella DPM  Anesthesia: General  Hemostasis: Thigh tourniquet inflated to a pressure of 300 mmHg after esmarch exsanguination   EBL: Minimal mL Materials: ConMed 1.8 mm all suture anchor x 2.  Integra Tenoglide tendon protector sheet 5x5cm.  Injectables: 20 mL of 0.5% Marcaine plain Pathology: None  Condition: The patient tolerated the procedure and anesthesia well. No complications noted or reported   Justification for procedure: The patient is a 63 y.o. female who presents today for surgical correction of acute rupture of the Achilles tendon right lower extremity with symptomatic posterior heel spur. The patient was told benefits as well as possible side effects of the surgery. The patient consented for surgical correction. The patient consent form was reviewed. All patient questions were answered. No guarantees were expressed or implied. The patient and the surgeon both signed the patient consent form with the witness present and placed in the patient's chart.   Procedure in Detail: The patient was brought to the operating room, placed in the operating table in a prone position at which time an aseptic scrub and drape were performed about the patient's respective lower extremity after anesthesia was induced as described above. Attention was then directed to the surgical area where procedure number one commenced.  Procedure #1: Posterior heel spur resection right An 8 cm linear longitudinal skin incision was planned and made bisecting the distal portion of the Achilles tendon and posterior tubercle of the calcaneus.  Incision was carefully dissected down to the level of Achilles  tendon calcaneus where the posterior heel spur was identified.  Careful additional soft tissue dissection was performed using #15 scalpel to reflect away the soft tissue from the posterior heel spur in preparation for the exostectomy.  A sagittal blade mounted sagittal saw was utilized to resect away the posterior heel spur down to smooth anatomical contour.  Power rasp was also utilized to smooth and bony protuberances to the posterior tubercle of the calcaneus.  Verified by intraoperative x-ray fluoroscopy.  Copious irrigation was then utilized in preparation for primary repair of the Achilles tendon to the posterior tubercle of the calcaneus  Procedure #2: Primary repair Achilles tendon rupture right The distal portion of the Achilles tendon rupture was then evaluated.  Sharp dissection of the nonviable portion of the Achilles was performed using a #15 scalpel as well as any calcific portions of tendon which was sharply resected.  Two separate ConMed 1.8 mm all suture anchors were then driven into the posterior tubercle of the calcaneus approximately 2.5 cm apart.  Inserted in the standard fashion as indicated on the surgical technique guide.  One strand of suture was removed from each anchor and the remaining suture was utilized to anchor the distal portion of the Achilles back onto the posterior tubercle of the calcaneus with the Achilles tendon under normal physiologic tension and the foot in slight plantarflexion.  A modified Krakw type technique was utilized with interlocking continuous suture running approximately 5 cm proximal to the insertion and tied.  Additional reinforcement of the Achilles was achieved using 2-0 Vicryl suture.  Negative Thompson test.  Good plantarflexion with compression of the calf muscle belly.  Additional irrigation was utilized followed  by application of Integra Tenoglide which was wrapped around the distal portion of the Achilles.  A combination of 3-0 Vicryl, 4-0 Vicryl,  skin staples, and 3-0 Prolene suture was utilized for routine layered primary closure of the incision site.  Dry sterile compressive dressings were then applied to all previously mentioned incision sites about the patient's lower extremity. The tourniquet which was used for hemostasis was deflated. All normal neurovascular responses including pink color and warmth returned all the digits of patient's lower extremity.  Prior to transfer to the recovery room application of below-knee fiberglass cast was applied to the surgical extremity.  The patient was then transferred from the operating room to the recovery room having tolerated the procedure and anesthesia well. All vital signs are stable. After a brief stay in the recovery room the patient was discharged with postoperative orders placed.     Dot Gazella, DPM [facility]  Dr. Dot Gazella, DPM    [address] [place], [place] [address]                Office [phone]  Fax [phone]

## 2024-04-11 ENCOUNTER — Encounter: Payer: Self-pay | Admitting: Podiatry

## 2024-04-12 ENCOUNTER — Encounter: Payer: Self-pay | Admitting: Podiatry

## 2024-04-12 ENCOUNTER — Other Ambulatory Visit: Payer: Self-pay | Admitting: Podiatry

## 2024-04-12 MED ORDER — IBUPROFEN 800 MG PO TABS
800.0000 mg | ORAL_TABLET | Freq: Three times a day (TID) | ORAL | 1 refills | Status: AC
Start: 2024-04-12 — End: ?

## 2024-04-12 NOTE — Progress Notes (Signed)
 PRN postop

## 2024-04-13 ENCOUNTER — Ambulatory Visit (INDEPENDENT_AMBULATORY_CARE_PROVIDER_SITE_OTHER): Admitting: Podiatry

## 2024-04-13 ENCOUNTER — Ambulatory Visit (INDEPENDENT_AMBULATORY_CARE_PROVIDER_SITE_OTHER)

## 2024-04-13 DIAGNOSIS — S86011A Strain of right Achilles tendon, initial encounter: Secondary | ICD-10-CM | POA: Diagnosis not present

## 2024-04-20 ENCOUNTER — Encounter: Payer: Self-pay | Admitting: Podiatry

## 2024-04-20 ENCOUNTER — Ambulatory Visit (INDEPENDENT_AMBULATORY_CARE_PROVIDER_SITE_OTHER): Admitting: Podiatry

## 2024-04-20 VITALS — Ht 62.0 in | Wt 152.0 lb

## 2024-04-20 DIAGNOSIS — S86011A Strain of right Achilles tendon, initial encounter: Secondary | ICD-10-CM

## 2024-04-24 NOTE — Progress Notes (Signed)
   Chief Complaint  Patient presents with   Routine Post Op    RM 6 POV # 1 DOS 04/08/24 RT ACHILLES TENDON REPAIR , RT HEEL SPUR RESECTION). Pt states she is having a lot of pain and pressure.    Subjective:  Patient presents today status post posterior heel spur resection with repair of Achilles tendon right lower extremity.  DOS: 04/08/2024.  Patient states that she feels significant amount of pressure to the posterior ankle along the surgical site.  Past Medical History:  Diagnosis Date   Anemia    Arthritis    pt requested removal   Fatty liver    GERD (gastroesophageal reflux disease)    Hypertension    Thyroid  disease    pt requested removal    Past Surgical History:  Procedure Laterality Date   ACHILLES TENDON SURGERY Right 04/08/2024   Procedure: REPAIR, TENDON, ACHILLES;  Surgeon: Dot Gazella, DPM;  Location: [facility];  Service: Orthopedics/Podiatry;  Laterality: Right;   APPENDECTOMY     CARPAL TUNNEL RELEASE Bilateral    EYE SURGERY Right 11/17/2009   lasix    HEEL SPUR RESECTION Right 04/08/2024   Procedure: EXCISION, BONE SPUR, CALCANEUS;  Surgeon: Dot Gazella, DPM;  Location: [facility];  Service: Orthopedics/Podiatry;  Laterality: Right;    No Known Allergies  Objective/Physical Exam Cast was bivalved and removed today.  Neurovascular status intact.  Incision well coapted with sutures and staples intact. No sign of infectious process noted. No dehiscence. No active bleeding noted.  Moderate edema noted to the surgical extremity.  Radiographic Exam RT foot 04/13/2024:  Interval resection of the posterior heel spur noted on lateral view.  Assessment: 1. s/p posterior heel spur resection with repair of Achilles tendon right. DOS: 04/08/2024   Plan of Care:  -Patient was evaluated. X-rays reviewed - The below-knee fiberglass cast was bivalved and removed today.  New cast was reapplied.  Patient states that it was much more comfortable. -Continue strict NWB  in the immobilization cast using the knee scooter -Continue Percocet 5/325 mg every 4 hours as needed pain -Return to clinic 3 weeks cast removal   Dot Gazella, DPM [facility]  Dr. Dot Gazella, DPM    [address] [place], [place] [address]                Office [phone]  Fax [phone]

## 2024-05-01 NOTE — Progress Notes (Signed)
   Chief Complaint  Patient presents with   Routine Post Op    POV # 2 DOS 04/08/24 RT ACHILLES TENDON REPAIR , RT HEEL SPUR RESECTION, pt states she is still in pain especially near the side of the ankle.    Subjective:  Patient presents today status post posterior heel spur resection with repair of Achilles tendon right lower extremity.  DOS: 04/08/2024.  Doing well.  No significant irritation from the cast.  She continues to have some pain and tenderness to the ankle  Past Medical History:  Diagnosis Date   Anemia    Arthritis    pt requested removal   Fatty liver    GERD (gastroesophageal reflux disease)    Hypertension    Thyroid  disease    pt requested removal    Past Surgical History:  Procedure Laterality Date   ACHILLES TENDON SURGERY Right 04/08/2024   Procedure: REPAIR, TENDON, ACHILLES;  Surgeon: Dot Gazella, DPM;  Location: [facility];  Service: Orthopedics/Podiatry;  Laterality: Right;   APPENDECTOMY     CARPAL TUNNEL RELEASE Bilateral    EYE SURGERY Right 11/17/2009   lasix    HEEL SPUR RESECTION Right 04/08/2024   Procedure: EXCISION, BONE SPUR, CALCANEUS;  Surgeon: Dot Gazella, DPM;  Location: [facility];  Service: Orthopedics/Podiatry;  Laterality: Right;    No Known Allergies  Objective/Physical Exam Cast left intact today.  Neurovascular status intact.  Capillary refill immediate to the toes.  No irritation from the cast  Radiographic Exam RT foot 04/13/2024:  Interval resection of the posterior heel spur noted on lateral view.  Assessment: 1. s/p posterior heel spur resection with repair of Achilles tendon right. DOS: 04/08/2024   Plan of Care:  -Patient was evaluated.  - Below-knee fiberglass cast was left intact today -Continue strict NWB in the immobilization cast using the knee scooter -Continue Percocet 5/325 mg every 4 hours as needed pain -Return to clinic 2 weeks cast removal   Dot Gazella, DPM [facility]  Dr. Dot Gazella, DPM    [address] [place], [place] [address]                Office [phone]  Fax [phone]

## 2024-05-04 ENCOUNTER — Other Ambulatory Visit: Payer: Self-pay

## 2024-05-04 ENCOUNTER — Ambulatory Visit (INDEPENDENT_AMBULATORY_CARE_PROVIDER_SITE_OTHER): Admitting: Podiatry

## 2024-05-04 DIAGNOSIS — S86011A Strain of right Achilles tendon, initial encounter: Secondary | ICD-10-CM

## 2024-05-04 NOTE — Progress Notes (Signed)
   Chief Complaint  Patient presents with   Routine Post Op    RM#6 POV # 3 DOS 04/08/24 RT ACHILLES TENDON REPAIR , RT HEEL SPUR RESECTION) [facility] PT-Patient son states here for cast removal today.Patient experiencing pain at times.    Subjective:  Patient presents today status post posterior heel spur resection with repair of Achilles tendon right lower extremity.  DOS: 04/08/2024.  Doing well.  No significant irritation from the cast.  Here for cast removal.   Past Medical History:  Diagnosis Date   Anemia    Arthritis    pt requested removal   Fatty liver    GERD (gastroesophageal reflux disease)    Hypertension    Thyroid  disease    pt requested removal    Past Surgical History:  Procedure Laterality Date   ACHILLES TENDON SURGERY Right 04/08/2024   Procedure: REPAIR, TENDON, ACHILLES;  Surgeon: Dot Gazella, DPM;  Location: [facility];  Service: Orthopedics/Podiatry;  Laterality: Right;   APPENDECTOMY     CARPAL TUNNEL RELEASE Bilateral    EYE SURGERY Right 11/17/2009   lasix    HEEL SPUR RESECTION Right 04/08/2024   Procedure: EXCISION, BONE SPUR, CALCANEUS;  Surgeon: Dot Gazella, DPM;  Location: [facility];  Service: Orthopedics/Podiatry;  Laterality: Right;    No Known Allergies  Objective/Physical Exam Cast left intact today.  Neurovascular status intact.  Capillary refill immediate to the toes.  No irritation from the cast  Radiographic Exam RT foot 04/13/2024:  Interval resection of the posterior heel spur noted on lateral view.  Assessment: 1. s/p posterior heel spur resection with repair of Achilles tendon right. DOS: 04/08/2024   Plan of Care:  -Patient was evaluated.  - Below-knee fiberglass cast removed today  -Sutures and staples removed without incident.  -Redressed foot -CAM boot dispensed with two heel lifts and may begin to put some weight to the foot in two days as tolerated.  Return in one week to see Dr. Luster Salters. Has a trip coming up in 10 days and  would like to be seen before leaving.

## 2024-05-11 ENCOUNTER — Encounter: Payer: Self-pay | Admitting: Podiatry

## 2024-05-11 ENCOUNTER — Ambulatory Visit (INDEPENDENT_AMBULATORY_CARE_PROVIDER_SITE_OTHER): Admitting: Podiatry

## 2024-05-11 VITALS — Ht 62.0 in | Wt 152.0 lb

## 2024-05-11 DIAGNOSIS — S86011A Strain of right Achilles tendon, initial encounter: Secondary | ICD-10-CM

## 2024-05-11 NOTE — Progress Notes (Unsigned)
   Chief Complaint  Patient presents with   Routine Post Op    Pt is here to f/u on right foot due to achilles tendon rupture pt states she still has some numbness and pain to foot especially to the right side of the foot. Pt states she does not put any weight on the foot continue to wear cam boot and using knee scooter to ambulate, icing foot as well.    Subjective:  Patient presents today status post posterior heel spur resection with repair of Achilles tendon right lower extremity.  DOS: 04/08/2024.  She has been NWB in the cam boot using the knee scooter  Past Medical History:  Diagnosis Date   Anemia    Arthritis    pt requested removal   Fatty liver    GERD (gastroesophageal reflux disease)    Hypertension    Thyroid  disease    pt requested removal    Past Surgical History:  Procedure Laterality Date   ACHILLES TENDON SURGERY Right 04/08/2024   Procedure: REPAIR, TENDON, ACHILLES;  Surgeon: Janit Thresa HERO, DPM;  Location: [facility];  Service: Orthopedics/Podiatry;  Laterality: Right;   APPENDECTOMY     CARPAL TUNNEL RELEASE Bilateral    EYE SURGERY Right 11/17/2009   lasix    HEEL SPUR RESECTION Right 04/08/2024   Procedure: EXCISION, BONE SPUR, CALCANEUS;  Surgeon: Janit Thresa HERO, DPM;  Location: [facility];  Service: Orthopedics/Podiatry;  Laterality: Right;    No Known Allergies  Objective/Physical Exam Incision is nicely healed. No dehiscence.  Muscle strength 5/5 all compartments.  She has good plantarflexion with light resistance.  There continues to be some tenderness around the posterior aspect of the ankle patient with mild edema.  Overall well-healing surgical site  Radiographic Exam RT foot 04/13/2024:  Interval resection of the posterior heel spur noted on lateral view.  Assessment: 1. s/p posterior heel spur resection with repair of Achilles tendon right. DOS: 04/08/2024   Plan of Care:  -Patient was evaluated.  -Begin WBAT CAM boot w/ assistance of the  walker.  Slowly transition off of the knee scooter -Begin passive range of motion exercises when nonweightbearing which were demonstrated today -Return to clinic 1 month to initiate physical therapy    Thresa EMERSON Janit, DPM [facility]  Dr. Thresa EMERSON Janit, DPM    [address] [place], [place] [address]                Office [phone]  Fax [phone]

## 2024-06-08 ENCOUNTER — Ambulatory Visit (INDEPENDENT_AMBULATORY_CARE_PROVIDER_SITE_OTHER): Admitting: Podiatry

## 2024-06-08 ENCOUNTER — Encounter: Payer: Self-pay | Admitting: Podiatry

## 2024-06-08 DIAGNOSIS — M7731 Calcaneal spur, right foot: Secondary | ICD-10-CM

## 2024-06-08 DIAGNOSIS — S86011A Strain of right Achilles tendon, initial encounter: Secondary | ICD-10-CM

## 2024-06-08 NOTE — Progress Notes (Signed)
   Chief Complaint  Patient presents with   Follow up    Right achilles tendon rupture. Surgery DOS 04/08/24. Some residual swelling and numbness in the lateral side of foot/ankle.     Subjective:  Patient presents today status post posterior heel spur resection with repair of Achilles tendon right lower extremity.  DOS: 04/08/2024.  Presenting today WBAT in a cam boot with the assistance of a crutch  Past Medical History:  Diagnosis Date   Anemia    Arthritis    pt requested removal   Fatty liver    GERD (gastroesophageal reflux disease)    Hypertension    Thyroid  disease    pt requested removal    Past Surgical History:  Procedure Laterality Date   ACHILLES TENDON SURGERY Right 04/08/2024   Procedure: REPAIR, TENDON, ACHILLES;  Surgeon: Janit Thresa HERO, DPM;  Location: [facility];  Service: Orthopedics/Podiatry;  Laterality: Right;   APPENDECTOMY     CARPAL TUNNEL RELEASE Bilateral    EYE SURGERY Right 11/17/2009   lasix    HEEL SPUR RESECTION Right 04/08/2024   Procedure: EXCISION, BONE SPUR, CALCANEUS;  Surgeon: Janit Thresa HERO, DPM;  Location: [facility];  Service: Orthopedics/Podiatry;  Laterality: Right;    No Known Allergies  Objective/Physical Exam Incision is nicely healed. No dehiscence.  Muscle strength 5/5 all compartments.  She has good plantarflexion with light resistance.  There continues to be some tenderness around the posterior aspect of the ankle patient with mild edema.  Overall well-healing surgical site  Radiographic Exam RT foot 04/13/2024:  Interval resection of the posterior heel spur noted on lateral view.  Assessment: 1. s/p posterior heel spur resection with repair of Achilles tendon right. DOS: 04/08/2024   Plan of Care:  -Patient was evaluated.  - Okay for FWB in the cam boot.  Continue PROM exercises to the foot and ankle which were demonstrated today -Order placed for physical therapy [facility] -Return to clinic 3 months   Thresa EMERSON Janit, DPM [facility]  Dr. Thresa EMERSON Janit, DPM    [address] [place], [place] [address]                Office [phone]  Fax [phone]

## 2024-06-21 ENCOUNTER — Other Ambulatory Visit: Payer: Self-pay

## 2024-06-21 ENCOUNTER — Ambulatory Visit: Attending: Podiatry

## 2024-06-21 DIAGNOSIS — R600 Localized edema: Secondary | ICD-10-CM | POA: Insufficient documentation

## 2024-06-21 DIAGNOSIS — R262 Difficulty in walking, not elsewhere classified: Secondary | ICD-10-CM | POA: Insufficient documentation

## 2024-06-21 DIAGNOSIS — M25571 Pain in right ankle and joints of right foot: Secondary | ICD-10-CM | POA: Diagnosis present

## 2024-06-21 DIAGNOSIS — S86011A Strain of right Achilles tendon, initial encounter: Secondary | ICD-10-CM | POA: Insufficient documentation

## 2024-06-21 DIAGNOSIS — M7731 Calcaneal spur, right foot: Secondary | ICD-10-CM | POA: Diagnosis not present

## 2024-06-21 DIAGNOSIS — M25671 Stiffness of right ankle, not elsewhere classified: Secondary | ICD-10-CM | POA: Diagnosis present

## 2024-06-21 NOTE — Therapy (Signed)
 OUTPATIENT PHYSICAL THERAPY LOWER EXTREMITY EVALUATION   Patient Name: Pam Hart MRN: [id number] DOB:1961/04/29, 63 y.o., female Today's Date: 06/21/2024  END OF SESSION:  PT End of Session - 06/21/24 1539     Visit Number 1         Visit Number 1 Number of Visits 16 Date for PT re-eval 08/16/2024  Authorization Type [employer] MCD Authorization Visit Number 5 Authorization Number of Visits 27  PT start time 1537 PT stop time 1615 PT time calculation (min) 38 min    Past Medical History:  Diagnosis Date   Anemia    Arthritis    pt requested removal   Fatty liver    GERD (gastroesophageal reflux disease)    Hypertension    Thyroid  disease    pt requested removal   Past Surgical History:  Procedure Laterality Date   ACHILLES TENDON SURGERY Right 04/08/2024   Procedure: REPAIR, TENDON, ACHILLES;  Surgeon: Janit Thresa CHRISTELLA, DPM;  Location: [facility];  Service: Orthopedics/Podiatry;  Laterality: Right;   APPENDECTOMY     CARPAL TUNNEL RELEASE Bilateral    EYE SURGERY Right 11/17/2009   lasix    HEEL SPUR RESECTION Right 04/08/2024   Procedure: EXCISION, BONE SPUR, CALCANEUS;  Surgeon: Janit Thresa CHRISTELLA, DPM;  Location: [facility];  Service: Orthopedics/Podiatry;  Laterality: Right;   Patient Active Problem List   Diagnosis Date Noted   Sinus bradycardia 02/03/2020   Pre-syncope 02/02/2020   Right elbow pain 01/05/2017   Right hand pain 01/05/2017   Thyroid  nodule 07/29/2016   Neck pain 10/29/2015   Hypertension, uncontrolled 08/22/2015   Chest pain with moderate risk of acute coronary syndrome 08/22/2015   GERD (gastroesophageal reflux disease) 08/22/2015   Hyperlipidemia 03/17/2013   FATIGUE 08/05/2007    PCP: Domenick Loma, NP  REFERRING PROVIDER: Janit Thresa CHRISTELLA, DPM  REFERRING DIAG:  [phone] (ICD-10-CM) - Achilles tendon rupture, right, initial encounter  M77.31 (ICD-10-CM) - Heel spur, right  S/p posterior heel spur resection with repair of ruptured  Achilles tendon right.  DOS: 04/08/2024 -Currently WBAT in cam boot -Okay to begin to slowly transition the patient out of the cam boot into tennis shoes.   -Strengthening against light resistance -Range of motion exercises  THERAPY DIAG:  Pain in right ankle and joints of right foot  Localized edema  Difficulty in walking, not elsewhere classified  Stiffness of right ankle, not elsewhere classified  Rationale for Evaluation and Treatment: Rehabilitation  ONSET DATE: 04/08/2024 DOS (posterior heel spur resection with Achilles Tendon repair  SUBJECTIVE:   SUBJECTIVE STATEMENT: Patient present to PT 10 weeks s/p Achilles Tendon repair and posterior heel spur resection. She states that she has been in the CAM boot for just over a month and is working on mobility exercises.   PERTINENT HISTORY: Relevant PMHx includes HTN and post op status   PAIN:  Are you having pain? Yes: NPRS scale: 4-5/10 current Pain location: posterior ankle and R calf  Pain description: aching, numbness/tingling, tenderness Aggravating factors: walking, sitting with legs down   Relieving factors: rest   PRECAUTIONS: None  RED FLAGS: None   WEIGHT BEARING RESTRICTIONS: Yes    FALLS:  Has patient fallen in last 6 months? Yes. Number of falls 1 03/24/24 followed by surgery   LIVING ENVIRONMENT: Lives with: lives with their family Lives in: House/apartment Stairs:Yes, internal and external with railing Has following equipment at home: Crutches and knee scooter  OCCUPATION: Interpreter  PLOF: Independent  PATIENT GOALS: To  be able to walk independently, driving to return to work, return to normal (including cleaning, shopping, cooking)   NEXT MD VISIT: 09/12/2024 with referring provider  OBJECTIVE:  Note: Objective measures were completed at Evaluation unless otherwise noted.  DIAGNOSTIC FINDINGS:   Per referring provider:  Radiographic Exam RT foot 04/13/2024:  Interval resection of the  posterior heel spur noted on lateral view.  PATIENT SURVEYS:  LEFS   Extreme difficulty/unable (0), Quite a bit of difficulty (1), Moderate difficulty (2), Little difficulty (3), No difficulty (4) Survey date:  06/21/2024  Any of your usual work, housework or school activities 1  2. Usual hobbies, recreational or sporting activities 4  3. Getting into/out of the bath 3  4. Walking between rooms 4  5. Putting on socks/shoes 4  6. Squatting  1  7. Lifting an object, like a bag of groceries from the floor 4  8. Performing light activities around your home 3  9. Performing heavy activities around your home 1  10. Getting into/out of a car 4  11. Walking 2 blocks 4  12. Walking 1 mile 3  13. Going up/down 10 stairs (1 flight) 2  14. Standing for 1 hour 2  15.  sitting for 1 hour 4  16. Running on even ground 0  17. Running on uneven ground 0  18. Making sharp turns while running fast 0  19. Hopping  0  20. Rolling over in bed 3  Score total:  47/80     COGNITION: Overall cognitive status: Within functional limits for tasks assessed      EDEMA:  Circumferential: R 9.25 L 8.25  Figure 8: R 9 L 8   LOWER EXTREMITY ROM:  Active ROM Right eval Left eval  Hip flexion    Hip extension    Hip abduction    Hip adduction    Hip internal rotation    Hip external rotation    Knee flexion    Knee extension    Ankle dorsiflexion -10 5  Ankle plantarflexion 40   Ankle inversion 45   Ankle eversion 8    (Blank rows = not tested)  LOWER EXTREMITY MMT:  MMT Right eval Left eval  Hip flexion    Hip extension    Hip abduction    Hip adduction    Hip internal rotation    Hip external rotation    Knee flexion    Knee extension    Ankle dorsiflexion    Ankle plantarflexion    Ankle inversion    Ankle eversion     (Blank rows = not tested)   GAIT: Distance walked: 50 feet from lobby to elevation room  Assistive device utilized: None Level of assistance: Modified  independence with boot Comments: mechanics impacted d/t CAM boot donned. Resulting antalgic gait pattern  TREATMENT DATE:   [facility] Adult PT Treatment:                                                DATE: 06/21/2024   Initial evaluation: see patient education and home exercise program as noted below      PATIENT EDUCATION:  Education details: reviewed initial home exercise program; discussion of POC, prognosis and goals for skilled PT   Person educated: Patient Education method: Explanation, Demonstration, and Handouts Education comprehension: verbalized understanding, returned demonstration, and needs further education  HOME EXERCISE PROGRAM: Access Code: [email or web address] URL: [email or web address] Date: 06/21/2024 Prepared by: Marko Molt  Exercises - Forward Step Up with Counter Support  - 3 x daily - 7 x weekly - Seated Ankle Circles  - 1 x daily - 7 x weekly - 2 sets - 10 reps - Seated Ankle Pumps on Table  - 1 x daily - 7 x weekly - 2 sets - 10 reps - Ankle Inversion Eversion Towel Slide  - 1 x daily - 7 x weekly - 2 sets - 10 reps - Seated Toe Curl  - 1 x daily - 7 x weekly - 2 sets - 10 reps - Toe Spreading  - 1 x daily - 7 x weekly - 2 sets - 10 reps  ASSESSMENT:  CLINICAL IMPRESSION: Jandy is a 63 y.o. female who was seen today for physical therapy evaluation and treatment for ankle pain with mobility deficits, 10 weeks s/p achilles tendon repair with posterior heel spur resection. She also presents with localized edema and difficulty walking. 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 She requires skilled PT services at this time to address relevant deficits and improve overall function.     OBJECTIVE  IMPAIRMENTS: Abnormal gait, decreased activity tolerance, decreased balance, decreased endurance, decreased mobility, difficulty walking, decreased ROM, decreased strength, increased edema, and pain.   ACTIVITY LIMITATIONS: carrying, lifting, bending, standing, squatting, stairs, dressing, and locomotion level  PARTICIPATION LIMITATIONS: meal prep, cleaning, laundry, interpersonal relationship, driving, shopping, community activity, and occupation  PERSONAL FACTORS: Past/current experiences and 1 comorbidity: HTN are also affecting patient's functional outcome.   REHAB POTENTIAL: Good  CLINICAL DECISION MAKING: Stable/uncomplicated  EVALUATION COMPLEXITY: Low   GOALS: Goals reviewed with patient? YES  SHORT TERM GOALS: Target date: 07/19/2024    Patient will be independent with initial home program at least 3 days/week.  Baseline: provided at eval Goal Status: INITIAL   2.  Patient will demonstrate improved DF AROM to at least neutral (0 degrees) Baseline: lacking 10 degrees Goal status: INITIAL   LONG TERM GOALS: Target date: 08/16/2024   Patient will report improved overall functional ability with LEFS score of 65/80 or greater.  Baseline: 47/80 Goal Status: INITIAL    2.  Patient will demonstrate at least 4+/5 R ankle MMT scores Goal status: INITIAL  3.  Patient will report ability to tolerate at least 20 minutes of driving in order to return to work-related traveling without exacerbation of pain or localized swelling.  Baseline: unable  Goal status: INITIAL  4.  Patient will demonstrate ability to tolerate at least 20 minutes of weight bearing activities in order to return to ADLs and work-related activities without exacerbation of  pain or localized swelling.  Baseline: unable  Goal status: INITIAL     PLAN:  PT FREQUENCY: 1-2x/week  PT DURATION: 8 weeks  PLANNED INTERVENTIONS: 97164- PT Re-evaluation, 97750- Physical Performance Testing, 97110-Therapeutic  exercises, 97530- Therapeutic activity, 97112- Neuromuscular re-education, 97535- Self Care, 02859- Manual therapy, [phone]- Gait training, [phone]- Aquatic Therapy, [phone]- Electrical stimulation (unattended), Patient/Family education, Balance training, Stair training, Taping, Joint mobilization, Cryotherapy, and Moist heat  PLAN FOR NEXT SESSION: begin ankle mobility and strengthening program, gradually progress WB as appropriate for post op status. Use of manual therapy and modalities as indicated    Marko Molt, PT, DPT  06/26/2024 2:26 PM

## 2024-06-28 ENCOUNTER — Ambulatory Visit

## 2024-06-28 DIAGNOSIS — R600 Localized edema: Secondary | ICD-10-CM

## 2024-06-28 DIAGNOSIS — M25571 Pain in right ankle and joints of right foot: Secondary | ICD-10-CM

## 2024-06-28 DIAGNOSIS — R262 Difficulty in walking, not elsewhere classified: Secondary | ICD-10-CM

## 2024-06-28 NOTE — Therapy (Signed)
 OUTPATIENT PHYSICAL THERAPY TREATMENT NOTE   Patient Name: Pam Hart MRN: [id number] DOB:[birth year], 63 y.o., female Today's Date: 06/28/2024  END OF SESSION:  PT End of Session - 06/28/24 1625     Visit Number 2    Number of Visits 16    Date for PT Re-Evaluation 08/16/24    Authorization Type [employer] MCD    Authorization - Visit Number 6    Authorization - Number of Visits 27    PT Start Time 1625    PT Stop Time 1655    PT Time Calculation (min) 30 min    Activity Tolerance Patient tolerated treatment well    Behavior During Therapy WFL for tasks assessed/performed         Past Medical History:  Diagnosis Date   Anemia    Arthritis    pt requested removal   Fatty liver    GERD (gastroesophageal reflux disease)    Hypertension    Thyroid  disease    pt requested removal   Past Surgical History:  Procedure Laterality Date   ACHILLES TENDON SURGERY Right 04/08/2024   Procedure: REPAIR, TENDON, ACHILLES;  Surgeon: Janit Thresa CHRISTELLA, DPM;  Location: [facility];  Service: Orthopedics/Podiatry;  Laterality: Right;   APPENDECTOMY     CARPAL TUNNEL RELEASE Bilateral    EYE SURGERY Right 11/17/2009   lasix    HEEL SPUR RESECTION Right 04/08/2024   Procedure: EXCISION, BONE SPUR, CALCANEUS;  Surgeon: Janit Thresa CHRISTELLA, DPM;  Location: [facility];  Service: Orthopedics/Podiatry;  Laterality: Right;   Patient Active Problem List   Diagnosis Date Noted   Sinus bradycardia 02/03/2020   Pre-syncope 02/02/2020   Right elbow pain 01/05/2017   Right hand pain 01/05/2017   Thyroid  nodule 07/29/2016   Neck pain 10/29/2015   Hypertension, uncontrolled 08/22/2015   Chest pain with moderate risk of acute coronary syndrome 08/22/2015   GERD (gastroesophageal reflux disease) 08/22/2015   Hyperlipidemia 03/17/2013   FATIGUE 08/05/2007    PCP: Domenick Loma, NP  REFERRING PROVIDER: Janit Thresa CHRISTELLA, DPM  REFERRING DIAG:  [phone] (ICD-10-CM) - Achilles tendon rupture, right, initial  encounter  M77.31 (ICD-10-CM) - Heel spur, right  S/p posterior heel spur resection with repair of ruptured Achilles tendon right.  DOS: 04/08/2024 -Currently WBAT in cam boot -Okay to begin to slowly transition the patient out of the cam boot into tennis shoes.   -Strengthening against light resistance -Range of motion exercises  THERAPY DIAG:  Pain in right ankle and joints of right foot  Localized edema  Difficulty in walking, not elsewhere classified  Rationale for Evaluation and Treatment: Rehabilitation  Next MD visit: October  ONSET DATE: 04/08/2024 DOS (posterior heel spur resection with Achilles Tendon repair  SUBJECTIVE:   SUBJECTIVE STATEMENT: Patient reports that she isn't having much pain today, has tried some exercises from HE[facility].  EVAL: Patient present to PT 10 weeks s/p Achilles Tendon repair and posterior heel spur resection. She states that she has been in the CAM boot for just over a month and is working on mobility exercises.   PERTINENT HISTORY: Relevant PMHx includes HTN and post op status   PAIN:  Are you having pain? Yes: NPRS scale: 4-5/10 current Pain location: posterior ankle and R calf  Pain description: aching, numbness/tingling, tenderness Aggravating factors: walking, sitting with legs down   Relieving factors: rest   PRECAUTIONS: None  RED FLAGS: None   WEIGHT BEARING RESTRICTIONS: Yes    FALLS:  Has patient fallen in  last 6 months? Yes. Number of falls 1 03/24/24 followed by surgery   LIVING ENVIRONMENT: Lives with: lives with their family Lives in: House/apartment Stairs:Yes, internal and external with railing Has following equipment at home: Crutches and knee scooter  OCCUPATION: Interpreter  PLOF: Independent  PATIENT GOALS: To be able to walk independently, driving to return to work, return to normal (including cleaning, shopping, cooking)   NEXT MD VISIT: 09/12/2024 with referring provider  OBJECTIVE:  Note: Objective  measures were completed at Evaluation unless otherwise noted.  DIAGNOSTIC FINDINGS:   Per referring provider:  Radiographic Exam RT foot 04/13/2024:  Interval resection of the posterior heel spur noted on lateral view.  PATIENT SURVEYS:  LEFS   Extreme difficulty/unable (0), Quite a bit of difficulty (1), Moderate difficulty (2), Little difficulty (3), No difficulty (4) Survey date:  06/21/2024  Any of your usual work, housework or school activities 1  2. Usual hobbies, recreational or sporting activities 4  3. Getting into/out of the bath 3  4. Walking between rooms 4  5. Putting on socks/shoes 4  6. Squatting  1  7. Lifting an object, like a bag of groceries from the floor 4  8. Performing light activities around your home 3  9. Performing heavy activities around your home 1  10. Getting into/out of a car 4  11. Walking 2 blocks 4  12. Walking 1 mile 3  13. Going up/down 10 stairs (1 flight) 2  14. Standing for 1 hour 2  15.  sitting for 1 hour 4  16. Running on even ground 0  17. Running on uneven ground 0  18. Making sharp turns while running fast 0  19. Hopping  0  20. Rolling over in bed 3  Score total:  47/80     COGNITION: Overall cognitive status: Within functional limits for tasks assessed      EDEMA:  Circumferential: R 9.25 L 8.25  Figure 8: R 9 L 8   LOWER EXTREMITY ROM:  Active ROM Right eval Left eval  Hip flexion    Hip extension    Hip abduction    Hip adduction    Hip internal rotation    Hip external rotation    Knee flexion    Knee extension    Ankle dorsiflexion -10 5  Ankle plantarflexion 40   Ankle inversion 45   Ankle eversion 8    (Blank rows = not tested)  LOWER EXTREMITY MMT:  MMT Right eval Left eval  Hip flexion    Hip extension    Hip abduction    Hip adduction    Hip internal rotation    Hip external rotation    Knee flexion    Knee extension    Ankle dorsiflexion    Ankle plantarflexion    Ankle  inversion    Ankle eversion     (Blank rows = not tested)   GAIT: Distance walked: 50 feet from lobby to elevation room  Assistive device utilized: None Level of assistance: Modified independence with boot Comments: mechanics impacted d/t CAM boot donned. Resulting antalgic gait pattern  TREATMENT DATE:  [facility] Adult PT Treatment:                                                DATE: 06/28/24 Therapeutic Exercise: Seated ankle pumps 2x20 Seated ankle circles x20 CW/CCW Ankle inv/ev towel x20 ea Seated towel scrunches 2x1' Seated rocker board AP 2x1' Therapeutic Activity: STS 2x10   [facility] Adult PT Treatment:                                                DATE: 06/21/2024   Initial evaluation: see patient education and home exercise program as noted below      PATIENT EDUCATION:  Education details: reviewed initial home exercise program; discussion of POC, prognosis and goals for skilled PT   Person educated: Patient Education method: Explanation, Demonstration, and Handouts Education comprehension: verbalized understanding, returned demonstration, and needs further education  HOME EXERCISE PROGRAM: Access Code: [email or web address] URL: [email or web address] Date: 06/21/2024 Prepared by: Marko Molt  Exercises - Forward Step Up with Counter Support  - 3 x daily - 7 x weekly - Seated Ankle Circles  - 1 x daily - 7 x weekly - 2 sets - 10 reps - Seated Ankle Pumps on Table  - 1 x daily - 7 x weekly - 2 sets - 10 reps - Ankle Inversion Eversion Towel Slide  - 1 x daily - 7 x weekly - 2 sets - 10 reps - Seated Toe Curl  - 1 x daily - 7 x weekly - 2 sets - 10 reps - Toe Spreading  - 1 x daily - 7 x weekly - 2 sets - 10 reps  ASSESSMENT:  CLINICAL IMPRESSION: Patient presents to first follow up PT session reporting minimal pain and that she has  tried some of her home exercises. Session today focused on foot intrinsic strengthening and ROM. Patient was able to tolerate all prescribed exercises with no adverse effects. Patient continues to benefit from skilled PT services and should be progressed as able to improve functional independence.   EVAL: Valeri is a 63 y.o. female who was seen today for physical therapy evaluation and treatment for ankle pain with mobility deficits, 10 weeks s/p achilles tendon repair with posterior heel spur resection. She also presents with localized edema and difficulty walking. 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 She requires skilled PT services at this time to address relevant deficits and improve overall function.     OBJECTIVE IMPAIRMENTS: Abnormal gait, decreased activity tolerance, decreased balance, decreased endurance, decreased mobility, difficulty walking, decreased ROM, decreased strength, increased edema, and pain.   ACTIVITY LIMITATIONS: carrying, lifting, bending, standing, squatting, stairs, dressing, and locomotion level  PARTICIPATION LIMITATIONS: meal prep, cleaning, laundry, interpersonal relationship, driving, shopping, community activity, and occupation  PERSONAL FACTORS: Past/current experiences and 1 comorbidity: HTN are also affecting patient's functional outcome.   REHAB POTENTIAL: Good  CLINICAL DECISION MAKING: Stable/uncomplicated  EVALUATION COMPLEXITY: Low   GOALS: Goals reviewed with patient? YES  SHORT TERM GOALS: Target date: 07/19/2024   Patient will be independent with initial home program at least 3  days/week.  Baseline: provided at eval Goal Status: INITIAL   2.  Patient will demonstrate improved DF AROM to at least neutral (0 degrees) Baseline: lacking 10 degrees Goal status: INITIAL   LONG TERM GOALS: Target date:  08/16/2024   Patient will report improved overall functional ability with LEFS score of 65/80 or greater.  Baseline: 47/80 Goal Status: INITIAL    2.  Patient will demonstrate at least 4+/5 R ankle MMT scores Goal status: INITIAL  3.  Patient will report ability to tolerate at least 20 minutes of driving in order to return to work-related traveling without exacerbation of pain or localized swelling.  Baseline: unable  Goal status: INITIAL  4.  Patient will demonstrate ability to tolerate at least 20 minutes of weight bearing activities in order to return to ADLs and work-related activities without exacerbation of pain or localized swelling.  Baseline: unable  Goal status: INITIAL     PLAN:  PT FREQUENCY: 1-2x/week  PT DURATION: 8 weeks  PLANNED INTERVENTIONS: 97164- PT Re-evaluation, 97750- Physical Performance Testing, 97110-Therapeutic exercises, 97530- Therapeutic activity, 97112- Neuromuscular re-education, 97535- Self Care, 02859- Manual therapy, [phone]- Gait training, [phone]- Aquatic Therapy, [phone]- Electrical stimulation (unattended), Patient/Family education, Balance training, Stair training, Taping, Joint mobilization, Cryotherapy, and Moist heat  PLAN FOR NEXT SESSION: begin ankle mobility and strengthening program, gradually progress WB as appropriate for post op status. Use of manual therapy and modalities as indicated    Corean Pouch PTA  06/28/2024 4:58 PM

## 2024-06-28 NOTE — Therapy (Signed)
 OUTPATIENT PHYSICAL THERAPY TREATMENT NOTE   Patient Name: Pam Hart MRN: [id number] DOB:1961/07/26, 63 y.o., female Today's Date: 06/28/2024  END OF SESSION:  PT End of Session - 06/28/24 1625     Visit Number 2    Number of Visits 16    Date for PT Re-Evaluation 08/16/24    Authorization Type [employer] MCD    Authorization - Visit Number 6    Authorization - Number of Visits 27    PT Start Time 1625    PT Stop Time 1655    PT Time Calculation (min) 30 min    Activity Tolerance Patient tolerated treatment well    Behavior During Therapy WFL for tasks assessed/performed         Past Medical History:  Diagnosis Date   Anemia    Arthritis    pt requested removal   Fatty liver    GERD (gastroesophageal reflux disease)    Hypertension    Thyroid  disease    pt requested removal   Past Surgical History:  Procedure Laterality Date   ACHILLES TENDON SURGERY Right 04/08/2024   Procedure: REPAIR, TENDON, ACHILLES;  Surgeon: Janit Thresa CHRISTELLA, DPM;  Location: [facility];  Service: Orthopedics/Podiatry;  Laterality: Right;   APPENDECTOMY     CARPAL TUNNEL RELEASE Bilateral    EYE SURGERY Right 11/17/2009   lasix    HEEL SPUR RESECTION Right 04/08/2024   Procedure: EXCISION, BONE SPUR, CALCANEUS;  Surgeon: Janit Thresa CHRISTELLA, DPM;  Location: [facility];  Service: Orthopedics/Podiatry;  Laterality: Right;   Patient Active Problem List   Diagnosis Date Noted   Sinus bradycardia 02/03/2020   Pre-syncope 02/02/2020   Right elbow pain 01/05/2017   Right hand pain 01/05/2017   Thyroid  nodule 07/29/2016   Neck pain 10/29/2015   Hypertension, uncontrolled 08/22/2015   Chest pain with moderate risk of acute coronary syndrome 08/22/2015   GERD (gastroesophageal reflux disease) 08/22/2015   Hyperlipidemia 03/17/2013   FATIGUE 08/05/2007    PCP: Domenick Loma, NP  REFERRING PROVIDER: Janit Thresa CHRISTELLA, DPM  REFERRING DIAG:  [phone] (ICD-10-CM) - Achilles tendon rupture, right, initial  encounter  M77.31 (ICD-10-CM) - Heel spur, right  S/p posterior heel spur resection with repair of ruptured Achilles tendon right.  DOS: 04/08/2024 -Currently WBAT in cam boot -Okay to begin to slowly transition the patient out of the cam boot into tennis shoes.   -Strengthening against light resistance -Range of motion exercises  THERAPY DIAG:  Pain in right ankle and joints of right foot  Localized edema  Difficulty in walking, not elsewhere classified  Rationale for Evaluation and Treatment: Rehabilitation  Next MD visit: October  ONSET DATE: 04/08/2024 DOS (posterior heel spur resection with Achilles Tendon repair  SUBJECTIVE:   SUBJECTIVE STATEMENT: Patient reports that she isn't having much pain today, has tried some exercises from HEP.  EVAL: Patient present to PT 10 weeks s/p Achilles Tendon repair and posterior heel spur resection. She states that she has been in the CAM boot for just over a month and is working on mobility exercises.   PERTINENT HISTORY: Relevant PMHx includes HTN and post op status   PAIN:  Are you having pain? Yes: NPRS scale: 4-5/10 current Pain location: posterior ankle and R calf  Pain description: aching, numbness/tingling, tenderness Aggravating factors: walking, sitting with legs down   Relieving factors: rest   PRECAUTIONS: None  RED FLAGS: None   WEIGHT BEARING RESTRICTIONS: Yes    FALLS:  Has patient fallen in  last 6 months? Yes. Number of falls 1 03/24/24 followed by surgery   LIVING ENVIRONMENT: Lives with: lives with their family Lives in: House/apartment Stairs:Yes, internal and external with railing Has following equipment at home: Crutches and knee scooter  OCCUPATION: Interpreter  PLOF: Independent  PATIENT GOALS: To be able to walk independently, driving to return to work, return to normal (including cleaning, shopping, cooking)   NEXT MD VISIT: 09/12/2024 with referring provider  OBJECTIVE:  Note: Objective  measures were completed at Evaluation unless otherwise noted.  DIAGNOSTIC FINDINGS:   Per referring provider:  Radiographic Exam RT foot 04/13/2024:  Interval resection of the posterior heel spur noted on lateral view.  PATIENT SURVEYS:  LEFS   Extreme difficulty/unable (0), Quite a bit of difficulty (1), Moderate difficulty (2), Little difficulty (3), No difficulty (4) Survey date:  06/21/2024  Any of your usual work, housework or school activities 1  2. Usual hobbies, recreational or sporting activities 4  3. Getting into/out of the bath 3  4. Walking between rooms 4  5. Putting on socks/shoes 4  6. Squatting  1  7. Lifting an object, like a bag of groceries from the floor 4  8. Performing light activities around your home 3  9. Performing heavy activities around your home 1  10. Getting into/out of a car 4  11. Walking 2 blocks 4  12. Walking 1 mile 3  13. Going up/down 10 stairs (1 flight) 2  14. Standing for 1 hour 2  15.  sitting for 1 hour 4  16. Running on even ground 0  17. Running on uneven ground 0  18. Making sharp turns while running fast 0  19. Hopping  0  20. Rolling over in bed 3  Score total:  47/80     COGNITION: Overall cognitive status: Within functional limits for tasks assessed      EDEMA:  Circumferential: R 9.25 L 8.25  Figure 8: R 9 L 8   LOWER EXTREMITY ROM:  Active ROM Right eval Left eval  Hip flexion    Hip extension    Hip abduction    Hip adduction    Hip internal rotation    Hip external rotation    Knee flexion    Knee extension    Ankle dorsiflexion -10 5  Ankle plantarflexion 40   Ankle inversion 45   Ankle eversion 8    (Blank rows = not tested)  LOWER EXTREMITY MMT:  MMT Right eval Left eval  Hip flexion    Hip extension    Hip abduction    Hip adduction    Hip internal rotation    Hip external rotation    Knee flexion    Knee extension    Ankle dorsiflexion    Ankle plantarflexion    Ankle  inversion    Ankle eversion     (Blank rows = not tested)   GAIT: Distance walked: 50 feet from lobby to elevation room  Assistive device utilized: None Level of assistance: Modified independence with boot Comments: mechanics impacted d/t CAM boot donned. Resulting antalgic gait pattern  TREATMENT DATE:  [facility] Adult PT Treatment:                                                DATE: 06/28/24 Therapeutic Exercise: Seated ankle pumps 2x20 Seated ankle circles x20 CW/CCW Ankle inv/ev towel x20 ea Seated towel scrunches 2x1' Seated rocker board AP 2x1' Therapeutic Activity: STS 2x10   [facility] Adult PT Treatment:                                                DATE: 06/21/2024   Initial evaluation: see patient education and home exercise program as noted below      PATIENT EDUCATION:  Education details: reviewed initial home exercise program; discussion of POC, prognosis and goals for skilled PT   Person educated: Patient Education method: Explanation, Demonstration, and Handouts Education comprehension: verbalized understanding, returned demonstration, and needs further education  HOME EXERCISE PROGRAM: Access Code: [email or web address] URL: [email or web address] Date: 06/21/2024 Prepared by: Marko Molt  Exercises - Forward Step Up with Counter Support  - 3 x daily - 7 x weekly - Seated Ankle Circles  - 1 x daily - 7 x weekly - 2 sets - 10 reps - Seated Ankle Pumps on Table  - 1 x daily - 7 x weekly - 2 sets - 10 reps - Ankle Inversion Eversion Towel Slide  - 1 x daily - 7 x weekly - 2 sets - 10 reps - Seated Toe Curl  - 1 x daily - 7 x weekly - 2 sets - 10 reps - Toe Spreading  - 1 x daily - 7 x weekly - 2 sets - 10 reps  ASSESSMENT:  CLINICAL IMPRESSION: Patient presents to first follow up PT session reporting minimal pain and that she has  tried some of her home exercises. Session today focused on foot intrinsic strengthening and ROM. Patient was able to tolerate all prescribed exercises with no adverse effects. Patient continues to benefit from skilled PT services and should be progressed as able to improve functional independence.   EVAL: Pam Hart is a 63 y.o. female who was seen today for physical therapy evaluation and treatment for ankle pain with mobility deficits, 10 weeks s/p achilles tendon repair with posterior heel spur resection. She also presents with localized edema and difficulty walking. 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 She requires skilled PT services at this time to address relevant deficits and improve overall function.     OBJECTIVE IMPAIRMENTS: Abnormal gait, decreased activity tolerance, decreased balance, decreased endurance, decreased mobility, difficulty walking, decreased ROM, decreased strength, increased edema, and pain.   ACTIVITY LIMITATIONS: carrying, lifting, bending, standing, squatting, stairs, dressing, and locomotion level  PARTICIPATION LIMITATIONS: meal prep, cleaning, laundry, interpersonal relationship, driving, shopping, community activity, and occupation  PERSONAL FACTORS: Past/current experiences and 1 comorbidity: HTN are also affecting patient's functional outcome.   REHAB POTENTIAL: Good  CLINICAL DECISION MAKING: Stable/uncomplicated  EVALUATION COMPLEXITY: Low   GOALS: Goals reviewed with patient? YES  SHORT TERM GOALS: Target date: 07/19/2024   Patient will be independent with initial home program at least 3  days/week.  Baseline: provided at eval Goal Status: INITIAL   2.  Patient will demonstrate improved DF AROM to at least neutral (0 degrees) Baseline: lacking 10 degrees Goal status: INITIAL   LONG TERM GOALS: Target date:  08/16/2024   Patient will report improved overall functional ability with LEFS score of 65/80 or greater.  Baseline: 47/80 Goal Status: INITIAL    2.  Patient will demonstrate at least 4+/5 R ankle MMT scores Goal status: INITIAL  3.  Patient will report ability to tolerate at least 20 minutes of driving in order to return to work-related traveling without exacerbation of pain or localized swelling.  Baseline: unable  Goal status: INITIAL  4.  Patient will demonstrate ability to tolerate at least 20 minutes of weight bearing activities in order to return to ADLs and work-related activities without exacerbation of pain or localized swelling.  Baseline: unable  Goal status: INITIAL     PLAN:  PT FREQUENCY: 1-2x/week  PT DURATION: 8 weeks  PLANNED INTERVENTIONS: 97164- PT Re-evaluation, 97750- Physical Performance Testing, 97110-Therapeutic exercises, 97530- Therapeutic activity, 97112- Neuromuscular re-education, 97535- Self Care, 02859- Manual therapy, [phone]- Gait training, [phone]- Aquatic Therapy, [phone]- Electrical stimulation (unattended), Patient/Family education, Balance training, Stair training, Taping, Joint mobilization, Cryotherapy, and Moist heat  PLAN FOR NEXT SESSION: begin ankle mobility and strengthening program, gradually progress WB as appropriate for post op status. Use of manual therapy and modalities as indicated    Corean Pouch PTA  06/28/2024 4:58 PM

## 2024-06-29 ENCOUNTER — Ambulatory Visit

## 2024-07-06 ENCOUNTER — Ambulatory Visit

## 2024-07-06 DIAGNOSIS — R600 Localized edema: Secondary | ICD-10-CM

## 2024-07-06 DIAGNOSIS — M25571 Pain in right ankle and joints of right foot: Secondary | ICD-10-CM | POA: Diagnosis not present

## 2024-07-06 DIAGNOSIS — R262 Difficulty in walking, not elsewhere classified: Secondary | ICD-10-CM

## 2024-07-06 NOTE — Therapy (Signed)
 OUTPATIENT PHYSICAL THERAPY TREATMENT NOTE   Patient Name: Pam Hart MRN: [id number] DOB:20-May-1961, 63 y.o., female Today's Date: 07/06/2024  END OF SESSION:  PT End of Session - 07/06/24 1542     Visit Number 3    Number of Visits 16    Date for PT Re-Evaluation 08/16/24    Authorization Type [employer] MCD    Authorization - Visit Number 7    Authorization - Number of Visits 27    PT Start Time 1542    PT Stop Time 1620    PT Time Calculation (min) 38 min    Activity Tolerance Patient tolerated treatment well    Behavior During Therapy WFL for tasks assessed/performed          Past Medical History:  Diagnosis Date   Anemia    Arthritis    pt requested removal   Fatty liver    GERD (gastroesophageal reflux disease)    Hypertension    Thyroid  disease    pt requested removal   Past Surgical History:  Procedure Laterality Date   ACHILLES TENDON SURGERY Right 04/08/2024   Procedure: REPAIR, TENDON, ACHILLES;  Surgeon: Janit Thresa CHRISTELLA, DPM;  Location: [facility];  Service: Orthopedics/Podiatry;  Laterality: Right;   APPENDECTOMY     CARPAL TUNNEL RELEASE Bilateral    EYE SURGERY Right 11/17/2009   lasix    HEEL SPUR RESECTION Right 04/08/2024   Procedure: EXCISION, BONE SPUR, CALCANEUS;  Surgeon: Janit Thresa CHRISTELLA, DPM;  Location: [facility];  Service: Orthopedics/Podiatry;  Laterality: Right;   Patient Active Problem List   Diagnosis Date Noted   Sinus bradycardia 02/03/2020   Pre-syncope 02/02/2020   Right elbow pain 01/05/2017   Right hand pain 01/05/2017   Thyroid  nodule 07/29/2016   Neck pain 10/29/2015   Hypertension, uncontrolled 08/22/2015   Chest pain with moderate risk of acute coronary syndrome 08/22/2015   GERD (gastroesophageal reflux disease) 08/22/2015   Hyperlipidemia 03/17/2013   FATIGUE 08/05/2007    PCP: Domenick Loma, NP  REFERRING PROVIDER: Janit Thresa CHRISTELLA, DPM  REFERRING DIAG:  [phone] (ICD-10-CM) - Achilles tendon rupture, right, initial  encounter  M77.31 (ICD-10-CM) - Heel spur, right  S/p posterior heel spur resection with repair of ruptured Achilles tendon right.  DOS: 04/08/2024 -Currently WBAT in cam boot -Okay to begin to slowly transition the patient out of the cam boot into tennis shoes.   -Strengthening against light resistance -Range of motion exercises  THERAPY DIAG:  Pain in right ankle and joints of right foot  Localized edema  Difficulty in walking, not elsewhere classified  Rationale for Evaluation and Treatment: Rehabilitation  Next MD visit: October  ONSET DATE: 04/08/2024 DOS (posterior heel spur resection with Achilles Tendon repair  SUBJECTIVE:   SUBJECTIVE STATEMENT: Patient reports that she is having some pain and swelling.  EVAL: Patient present to PT 10 weeks s/p Achilles Tendon repair and posterior heel spur resection. She states that she has been in the CAM boot for just over a month and is working on mobility exercises.   PERTINENT HISTORY: Relevant PMHx includes HTN and post op status   PAIN:  Are you having pain? Yes: NPRS scale: 4-5/10 current Pain location: posterior ankle and R calf  Pain description: aching, numbness/tingling, tenderness Aggravating factors: walking, sitting with legs down   Relieving factors: rest   PRECAUTIONS: None  RED FLAGS: None   WEIGHT BEARING RESTRICTIONS: Yes    FALLS:  Has patient fallen in last 6 months? Yes.  Number of falls 1 03/24/24 followed by surgery   LIVING ENVIRONMENT: Lives with: lives with their family Lives in: House/apartment Stairs:Yes, internal and external with railing Has following equipment at home: Crutches and knee scooter  OCCUPATION: Interpreter  PLOF: Independent  PATIENT GOALS: To be able to walk independently, driving to return to work, return to normal (including cleaning, shopping, cooking)   NEXT MD VISIT: 09/12/2024 with referring provider  OBJECTIVE:  Note: Objective measures were completed at  Evaluation unless otherwise noted.  DIAGNOSTIC FINDINGS:   Per referring provider:  Radiographic Exam RT foot 04/13/2024:  Interval resection of the posterior heel spur noted on lateral view.  PATIENT SURVEYS:  LEFS   Extreme difficulty/unable (0), Quite a bit of difficulty (1), Moderate difficulty (2), Little difficulty (3), No difficulty (4) Survey date:  06/21/2024  Any of your usual work, housework or school activities 1  2. Usual hobbies, recreational or sporting activities 4  3. Getting into/out of the bath 3  4. Walking between rooms 4  5. Putting on socks/shoes 4  6. Squatting  1  7. Lifting an object, like a bag of groceries from the floor 4  8. Performing light activities around your home 3  9. Performing heavy activities around your home 1  10. Getting into/out of a car 4  11. Walking 2 blocks 4  12. Walking 1 mile 3  13. Going up/down 10 stairs (1 flight) 2  14. Standing for 1 hour 2  15.  sitting for 1 hour 4  16. Running on even ground 0  17. Running on uneven ground 0  18. Making sharp turns while running fast 0  19. Hopping  0  20. Rolling over in bed 3  Score total:  47/80     COGNITION: Overall cognitive status: Within functional limits for tasks assessed      EDEMA:  Circumferential: R 9.25 L 8.25  Figure 8: R 9 L 8   LOWER EXTREMITY ROM:  Active ROM Right eval Left eval  Hip flexion    Hip extension    Hip abduction    Hip adduction    Hip internal rotation    Hip external rotation    Knee flexion    Knee extension    Ankle dorsiflexion -10 5  Ankle plantarflexion 40   Ankle inversion 45   Ankle eversion 8    (Blank rows = not tested)  LOWER EXTREMITY MMT:  MMT Right eval Left eval  Hip flexion    Hip extension    Hip abduction    Hip adduction    Hip internal rotation    Hip external rotation    Knee flexion    Knee extension    Ankle dorsiflexion    Ankle plantarflexion    Ankle inversion    Ankle eversion      (Blank rows = not tested)   GAIT: Distance walked: 50 feet from lobby to elevation room  Assistive device utilized: None Level of assistance: Modified independence with boot Comments: mechanics impacted d/t CAM boot donned. Resulting antalgic gait pattern  TREATMENT DATE:  [facility] Adult PT Treatment:                                                DATE: 07/06/24 Therapeutic Exercise: Seated ankle pumps 2x20 Seated ankle circles x20 CW/CCW Ankle inv/ev towel x1' Seated towel scrunches 2x1' Seated rocker board AP 2x1' Therapeutic Activity: Weight shifting holding onto FWW  AP/lat x1' ea with discussion of slowly transitioning out of boot, working on weight bearing at home outside of boot for short distances  [facility] Adult PT Treatment:                                                DATE: 06/28/24 Therapeutic Exercise: Seated ankle pumps 2x20 Seated ankle circles x20 CW/CCW Ankle inv/ev towel x20 ea Seated towel scrunches 2x1' Seated rocker board AP 2x1' Therapeutic Activity: STS 2x10   [facility] Adult PT Treatment:                                                DATE: 06/21/2024   Initial evaluation: see patient education and home exercise program as noted below      PATIENT EDUCATION:  Education details: reviewed initial home exercise program; discussion of POC, prognosis and goals for skilled PT   Person educated: Patient Education method: Explanation, Demonstration, and Handouts Education comprehension: verbalized understanding, returned demonstration, and needs further education  HOME EXERCISE PROGRAM: Access Code: [email or web address] URL: [email or web address] Date: 07/06/2024 Prepared by: Corean Pouch  Exercises - Forward Step Up with Counter Support  - 3 x daily - 7 x weekly - Seated Ankle Circles  - 1 x daily - 7 x weekly - 2 sets - 10  reps - Seated Ankle Pumps on Table  - 1 x daily - 7 x weekly - 2 sets - 10 reps - Ankle Inversion Eversion Towel Slide  - 1 x daily - 7 x weekly - 2 sets - 10 reps - Seated Toe Curl  - 1 x daily - 7 x weekly - 2 sets - 10 reps - Toe Spreading  - 1 x daily - 7 x weekly - 2 sets - 10 reps - Side to Side Weight Shift with Counter Support  - 1 x daily - 7 x weekly - 3 sets - 10 reps  ASSESSMENT:  CLINICAL IMPRESSION: Patient presents to PT reporting minimal pain in her ankle, continues to describe a sharp pain that she says feels like stitches inside are poking her. Session today continued to focus on foot intrinsic strengthening and ankle ROM. We also introduced gentle weight shifting to begin on weight bearing and transitioning out of the boot. Updated HEP this session to include weight shifting. Patient was able to tolerate all prescribed exercises with no adverse effects. Patient continues to benefit from skilled PT services and should be progressed as able to improve functional independence.   EVAL: Pam Hart is a 63 y.o. female who was seen today for physical therapy evaluation and treatment for ankle pain with mobility deficits, 10 weeks s/p achilles tendon repair with posterior heel spur resection. She also  presents with localized edema and difficulty walking. 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 She requires skilled PT services at this time to address relevant deficits and improve overall function.     OBJECTIVE IMPAIRMENTS: Abnormal gait, decreased activity tolerance, decreased balance, decreased endurance, decreased mobility, difficulty walking, decreased ROM, decreased strength, increased edema, and pain.   ACTIVITY LIMITATIONS: carrying, lifting, bending, standing, squatting, stairs, dressing, and locomotion level  PARTICIPATION LIMITATIONS: meal  prep, cleaning, laundry, interpersonal relationship, driving, shopping, community activity, and occupation  PERSONAL FACTORS: Past/current experiences and 1 comorbidity: HTN are also affecting patient's functional outcome.   REHAB POTENTIAL: Good  CLINICAL DECISION MAKING: Stable/uncomplicated  EVALUATION COMPLEXITY: Low   GOALS: Goals reviewed with patient? YES  SHORT TERM GOALS: Target date: 07/19/2024   Patient will be independent with initial home program at least 3 days/week.  Baseline: provided at eval Goal Status: INITIAL   2.  Patient will demonstrate improved DF AROM to at least neutral (0 degrees) Baseline: lacking 10 degrees Goal status: INITIAL   LONG TERM GOALS: Target date: 08/16/2024   Patient will report improved overall functional ability with LEFS score of 65/80 or greater.  Baseline: 47/80 Goal Status: INITIAL    2.  Patient will demonstrate at least 4+/5 R ankle MMT scores Goal status: INITIAL  3.  Patient will report ability to tolerate at least 20 minutes of driving in order to return to work-related traveling without exacerbation of pain or localized swelling.  Baseline: unable  Goal status: INITIAL  4.  Patient will demonstrate ability to tolerate at least 20 minutes of weight bearing activities in order to return to ADLs and work-related activities without exacerbation of pain or localized swelling.  Baseline: unable  Goal status: INITIAL     PLAN:  PT FREQUENCY: 1-2x/week  PT DURATION: 8 weeks  PLANNED INTERVENTIONS: 97164- PT Re-evaluation, 97750- Physical Performance Testing, 97110-Therapeutic exercises, 97530- Therapeutic activity, 97112- Neuromuscular re-education, 97535- Self Care, 02859- Manual therapy, [phone]- Gait training, [phone]- Aquatic Therapy, [phone]- Electrical stimulation (unattended), Patient/Family education, Balance training, Stair training, Taping, Joint mobilization, Cryotherapy, and Moist heat  PLAN FOR NEXT SESSION:  begin ankle mobility and strengthening program, gradually progress WB as appropriate for post op status. Use of manual therapy and modalities as indicated    Corean Pouch PTA  07/06/2024 4:33 PM

## 2024-07-13 ENCOUNTER — Ambulatory Visit

## 2024-07-13 DIAGNOSIS — M25571 Pain in right ankle and joints of right foot: Secondary | ICD-10-CM

## 2024-07-13 DIAGNOSIS — R262 Difficulty in walking, not elsewhere classified: Secondary | ICD-10-CM

## 2024-07-13 DIAGNOSIS — R600 Localized edema: Secondary | ICD-10-CM

## 2024-07-13 NOTE — Therapy (Signed)
 OUTPATIENT PHYSICAL THERAPY TREATMENT NOTE   Patient Name: Pam Hart MRN: [id number] DOB:[birth year], 63 y.o., female Today's Date: 07/13/2024  END OF SESSION:  PT End of Session - 07/13/24 1456     Visit Number 4    Number of Visits 16    Date for PT Re-Evaluation 08/16/24    Authorization Type [employer] MCD    Authorization - Visit Number 8    Authorization - Number of Visits 27    PT Start Time 1455    PT Stop Time 1525    PT Time Calculation (min) 30 min    Activity Tolerance Patient tolerated treatment well    Behavior During Therapy WFL for tasks assessed/performed           Past Medical History:  Diagnosis Date   Anemia    Arthritis    pt requested removal   Fatty liver    GERD (gastroesophageal reflux disease)    Hypertension    Thyroid  disease    pt requested removal   Past Surgical History:  Procedure Laterality Date   ACHILLES TENDON SURGERY Right 04/08/2024   Procedure: REPAIR, TENDON, ACHILLES;  Surgeon: Janit Thresa CHRISTELLA, DPM;  Location: [facility];  Service: Orthopedics/Podiatry;  Laterality: Right;   APPENDECTOMY     CARPAL TUNNEL RELEASE Bilateral    EYE SURGERY Right 11/17/2009   lasix    HEEL SPUR RESECTION Right 04/08/2024   Procedure: EXCISION, BONE SPUR, CALCANEUS;  Surgeon: Janit Thresa CHRISTELLA, DPM;  Location: [facility];  Service: Orthopedics/Podiatry;  Laterality: Right;   Patient Active Problem List   Diagnosis Date Noted   Sinus bradycardia 02/03/2020   Pre-syncope 02/02/2020   Right elbow pain 01/05/2017   Right hand pain 01/05/2017   Thyroid  nodule 07/29/2016   Neck pain 10/29/2015   Hypertension, uncontrolled 08/22/2015   Chest pain with moderate risk of acute coronary syndrome 08/22/2015   GERD (gastroesophageal reflux disease) 08/22/2015   Hyperlipidemia 03/17/2013   FATIGUE 08/05/2007    PCP: Domenick Loma, NP  REFERRING PROVIDER: Janit Thresa CHRISTELLA, DPM  REFERRING DIAG:  [phone] (ICD-10-CM) - Achilles tendon rupture, right,  initial encounter  M77.31 (ICD-10-CM) - Heel spur, right  S/p posterior heel spur resection with repair of ruptured Achilles tendon right.  DOS: 04/08/2024 -Currently WBAT in cam boot -Okay to begin to slowly transition the patient out of the cam boot into tennis shoes.   -Strengthening against light resistance -Range of motion exercises  THERAPY DIAG:  Pain in right ankle and joints of right foot  Localized edema  Difficulty in walking, not elsewhere classified  Rationale for Evaluation and Treatment: Rehabilitation  Next MD visit: October  ONSET DATE: 04/08/2024 DOS (posterior heel spur resection with Achilles Tendon repair  SUBJECTIVE:   SUBJECTIVE STATEMENT: Patient reports that she is having some pain and swelling.  EVAL: Patient present to PT 10 weeks s/p Achilles Tendon repair and posterior heel spur resection. She states that she has been in the CAM boot for just over a month and is working on mobility exercises.   PERTINENT HISTORY: Relevant PMHx includes HTN and post op status   PAIN:  Are you having pain? Yes: NPRS scale: 4-5/10 current Pain location: posterior ankle and R calf  Pain description: aching, numbness/tingling, tenderness Aggravating factors: walking, sitting with legs down   Relieving factors: rest   PRECAUTIONS: None  RED FLAGS: None   WEIGHT BEARING RESTRICTIONS: Yes    FALLS:  Has patient fallen in last 6 months?  Yes. Number of falls 1 03/24/24 followed by surgery   LIVING ENVIRONMENT: Lives with: lives with their family Lives in: House/apartment Stairs:Yes, internal and external with railing Has following equipment at home: Crutches and knee scooter  OCCUPATION: Interpreter  PLOF: Independent  PATIENT GOALS: To be able to walk independently, driving to return to work, return to normal (including cleaning, shopping, cooking)   NEXT MD VISIT: 09/12/2024 with referring provider  OBJECTIVE:  Note: Objective measures were completed at  Evaluation unless otherwise noted.  DIAGNOSTIC FINDINGS:   Per referring provider:  Radiographic Exam RT foot 04/13/2024:  Interval resection of the posterior heel spur noted on lateral view.  PATIENT SURVEYS:  LEFS   Extreme difficulty/unable (0), Quite a bit of difficulty (1), Moderate difficulty (2), Little difficulty (3), No difficulty (4) Survey date:  06/21/2024  Any of your usual work, housework or school activities 1  2. Usual hobbies, recreational or sporting activities 4  3. Getting into/out of the bath 3  4. Walking between rooms 4  5. Putting on socks/shoes 4  6. Squatting  1  7. Lifting an object, like a bag of groceries from the floor 4  8. Performing light activities around your home 3  9. Performing heavy activities around your home 1  10. Getting into/out of a car 4  11. Walking 2 blocks 4  12. Walking 1 mile 3  13. Going up/down 10 stairs (1 flight) 2  14. Standing for 1 hour 2  15.  sitting for 1 hour 4  16. Running on even ground 0  17. Running on uneven ground 0  18. Making sharp turns while running fast 0  19. Hopping  0  20. Rolling over in bed 3  Score total:  47/80     COGNITION: Overall cognitive status: Within functional limits for tasks assessed      EDEMA:  Circumferential: R 9.25 L 8.25  Figure 8: R 9 L 8   LOWER EXTREMITY ROM:  Active ROM Right eval Left eval  Hip flexion    Hip extension    Hip abduction    Hip adduction    Hip internal rotation    Hip external rotation    Knee flexion    Knee extension    Ankle dorsiflexion -10 5  Ankle plantarflexion 40   Ankle inversion 45   Ankle eversion 8    (Blank rows = not tested)  LOWER EXTREMITY MMT:  MMT Right eval Left eval  Hip flexion    Hip extension    Hip abduction    Hip adduction    Hip internal rotation    Hip external rotation    Knee flexion    Knee extension    Ankle dorsiflexion    Ankle plantarflexion    Ankle inversion    Ankle eversion      (Blank rows = not tested)   GAIT: Distance walked: 50 feet from lobby to elevation room  Assistive device utilized: None Level of assistance: Modified independence with boot Comments: mechanics impacted d/t CAM boot donned. Resulting antalgic gait pattern  TREATMENT DATE:  [facility] Adult PT Treatment:                                                DATE: 07/13/24 Therapeutic Exercise: Seated ankle pumps 2x20 Seated ankle circles x20 CW/CCW Ankle inv/ev towel x1' Seated towel scrunches x1' Therapeutic Activity in // bars: Weight shifting AP/lat x1' ea Fwd step on RLE with focus on heel strike x1' Fwd ambulation - cues for heel strike, toe off, and increasing stance time on RLE - x3 laps Sidestepping x3 laps   [facility] Adult PT Treatment:                                                DATE: 07/06/24 Therapeutic Exercise: Seated ankle pumps 2x20 Seated ankle circles x20 CW/CCW Ankle inv/ev towel x1' Seated towel scrunches 2x1' Seated rocker board AP 2x1' Therapeutic Activity: Weight shifting holding onto FWW  AP/lat x1' ea with discussion of slowly transitioning out of boot, working on weight bearing at home outside of boot for short distances  [facility] Adult PT Treatment:                                                DATE: 06/28/24 Therapeutic Exercise: Seated ankle pumps 2x20 Seated ankle circles x20 CW/CCW Ankle inv/ev towel x20 ea Seated towel scrunches 2x1' Seated rocker board AP 2x1' Therapeutic Activity: STS 2x10    PATIENT EDUCATION:  Education details: reviewed initial home exercise program; discussion of POC, prognosis and goals for skilled PT   Person educated: Patient Education method: Explanation, Demonstration, and Handouts Education comprehension: verbalized understanding, returned demonstration, and needs further education  HOME EXERCISE  PROGRAM: Access Code: [email or web address] URL: [email or web address] Date: 07/06/2024 Prepared by: Corean Pouch  Exercises - Forward Step Up with Counter Support  - 3 x daily - 7 x weekly - Seated Ankle Circles  - 1 x daily - 7 x weekly - 2 sets - 10 reps - Seated Ankle Pumps on Table  - 1 x daily - 7 x weekly - 2 sets - 10 reps - Ankle Inversion Eversion Towel Slide  - 1 x daily - 7 x weekly - 2 sets - 10 reps - Seated Toe Curl  - 1 x daily - 7 x weekly - 2 sets - 10 reps - Toe Spreading  - 1 x daily - 7 x weekly - 2 sets - 10 reps - Side to Side Weight Shift with Counter Support  - 1 x daily - 7 x weekly - 3 sets - 10 reps  ASSESSMENT:  CLINICAL IMPRESSION: Patient presents to PT reporting some pain in the anterior portion or her ankle as well as in the achilles area. She has tried wlaking some at home without the boot, has more fear of walking that outright pain. Session today introduced more standing activities and ambulation in shoe to good effect. She does well with minimal cuing and is able to ambulate with single UE support on parallel bars. She does well with positive reinforcement to reduce fear. Patient was able to tolerate all prescribed exercises  with no adverse effects. Patient continues to benefit from skilled PT services and should be progressed as able to improve functional independence.   EVAL: Tacia is a 63 y.o. female who was seen today for physical therapy evaluation and treatment for ankle pain with mobility deficits, 10 weeks s/p achilles tendon repair with posterior heel spur resection. She also presents with localized edema and difficulty walking. 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 She requires skilled PT services at this time to address relevant deficits and improve overall function.     OBJECTIVE IMPAIRMENTS:  Abnormal gait, decreased activity tolerance, decreased balance, decreased endurance, decreased mobility, difficulty walking, decreased ROM, decreased strength, increased edema, and pain.   ACTIVITY LIMITATIONS: carrying, lifting, bending, standing, squatting, stairs, dressing, and locomotion level  PARTICIPATION LIMITATIONS: meal prep, cleaning, laundry, interpersonal relationship, driving, shopping, community activity, and occupation  PERSONAL FACTORS: Past/current experiences and 1 comorbidity: HTN are also affecting patient's functional outcome.   REHAB POTENTIAL: Good  CLINICAL DECISION MAKING: Stable/uncomplicated  EVALUATION COMPLEXITY: Low   GOALS: Goals reviewed with patient? YES  SHORT TERM GOALS: Target date: 07/19/2024   Patient will be independent with initial home program at least 3 days/week.  Baseline: provided at eval Goal Status: INITIAL   2.  Patient will demonstrate improved DF AROM to at least neutral (0 degrees) Baseline: lacking 10 degrees Goal status: INITIAL   LONG TERM GOALS: Target date: 08/16/2024   Patient will report improved overall functional ability with LEFS score of 65/80 or greater.  Baseline: 47/80 Goal Status: INITIAL    2.  Patient will demonstrate at least 4+/5 R ankle MMT scores Goal status: INITIAL  3.  Patient will report ability to tolerate at least 20 minutes of driving in order to return to work-related traveling without exacerbation of pain or localized swelling.  Baseline: unable  Goal status: INITIAL  4.  Patient will demonstrate ability to tolerate at least 20 minutes of weight bearing activities in order to return to ADLs and work-related activities without exacerbation of pain or localized swelling.  Baseline: unable  Goal status: INITIAL     PLAN:  PT FREQUENCY: 1-2x/week  PT DURATION: 8 weeks  PLANNED INTERVENTIONS: 97164- PT Re-evaluation, 97750- Physical Performance Testing, 97110-Therapeutic exercises,  97530- Therapeutic activity, 97112- Neuromuscular re-education, 97535- Self Care, [id number]- Manual therapy, [phone]- Gait training, [phone]- Aquatic Therapy, [phone]- Electrical stimulation (unattended), Patient/Family education, Balance training, Stair training, Taping, Joint mobilization, Cryotherapy, and Moist heat  PLAN FOR NEXT SESSION: begin ankle mobility and strengthening program, gradually progress WB as appropriate for post op status. Use of manual therapy and modalities as indicated    Corean Pouch PTA  07/13/2024 3:29 PM

## 2024-07-19 ENCOUNTER — Ambulatory Visit: Attending: Family Medicine

## 2024-07-19 DIAGNOSIS — M25671 Stiffness of right ankle, not elsewhere classified: Secondary | ICD-10-CM | POA: Diagnosis present

## 2024-07-19 DIAGNOSIS — M25571 Pain in right ankle and joints of right foot: Secondary | ICD-10-CM | POA: Diagnosis present

## 2024-07-19 DIAGNOSIS — R600 Localized edema: Secondary | ICD-10-CM | POA: Insufficient documentation

## 2024-07-19 DIAGNOSIS — R262 Difficulty in walking, not elsewhere classified: Secondary | ICD-10-CM | POA: Insufficient documentation

## 2024-07-19 NOTE — Therapy (Signed)
 OUTPATIENT PHYSICAL THERAPY TREATMENT NOTE   Patient Name: SAMEENA ARTUS MRN: [id number] DOB:[birth year], 63 y.o., female Today's Date: 07/19/2024  END OF SESSION:  PT End of Session - 07/19/24 1624     Visit Number 5    Number of Visits 16    Date for PT Re-Evaluation 08/16/24    Authorization Type [employer] MCD    Authorization - Visit Number 9    Authorization - Number of Visits 27    PT Start Time 1625    PT Stop Time 1705    PT Time Calculation (min) 40 min            Past Medical History:  Diagnosis Date   Anemia    Arthritis    pt requested removal   Fatty liver    GERD (gastroesophageal reflux disease)    Hypertension    Thyroid  disease    pt requested removal   Past Surgical History:  Procedure Laterality Date   ACHILLES TENDON SURGERY Right 04/08/2024   Procedure: REPAIR, TENDON, ACHILLES;  Surgeon: Janit Thresa CHRISTELLA, DPM;  Location: [facility];  Service: Orthopedics/Podiatry;  Laterality: Right;   APPENDECTOMY     CARPAL TUNNEL RELEASE Bilateral    EYE SURGERY Right 11/17/2009   lasix    HEEL SPUR RESECTION Right 04/08/2024   Procedure: EXCISION, BONE SPUR, CALCANEUS;  Surgeon: Janit Thresa CHRISTELLA, DPM;  Location: [facility];  Service: Orthopedics/Podiatry;  Laterality: Right;   Patient Active Problem List   Diagnosis Date Noted   Sinus bradycardia 02/03/2020   Pre-syncope 02/02/2020   Right elbow pain 01/05/2017   Right hand pain 01/05/2017   Thyroid  nodule 07/29/2016   Neck pain 10/29/2015   Hypertension, uncontrolled 08/22/2015   Chest pain with moderate risk of acute coronary syndrome 08/22/2015   GERD (gastroesophageal reflux disease) 08/22/2015   Hyperlipidemia 03/17/2013   FATIGUE 08/05/2007    PCP: Domenick Loma, NP  REFERRING PROVIDER: Janit Thresa CHRISTELLA, DPM  REFERRING DIAG:  [phone] (ICD-10-CM) - Achilles tendon rupture, right, initial encounter  M77.31 (ICD-10-CM) - Heel spur, right  S/p posterior heel spur resection with repair of ruptured  Achilles tendon right.  DOS: 04/08/2024 -Currently WBAT in cam boot -Okay to begin to slowly transition the patient out of the cam boot into tennis shoes.   -Strengthening against light resistance -Range of motion exercises  THERAPY DIAG:  Pain in right ankle and joints of right foot  Localized edema  Difficulty in walking, not elsewhere classified  Rationale for Evaluation and Treatment: Rehabilitation  Next MD visit: October  ONSET DATE: 04/08/2024 DOS (posterior heel spur resection with Achilles Tendon repair  SUBJECTIVE:   SUBJECTIVE STATEMENT: Patient reports that she is having some pain and swelling. Reports that she has to lift her heel when walking without the boot at home. She has concern for posterior heel pain on L foot at this time.   EVAL: Patient present to PT 10 weeks s/p Achilles Tendon repair and posterior heel spur resection. She states that she has been in the CAM boot for just over a month and is working on mobility exercises.   PERTINENT HISTORY: Relevant PMHx includes HTN and post op status   PAIN:  Are you having pain? Yes: NPRS scale: 4-5/10 current Pain location: posterior ankle and R calf  Pain description: aching, numbness/tingling, tenderness Aggravating factors: walking, sitting with legs down   Relieving factors: rest   PRECAUTIONS: None  RED FLAGS: None   WEIGHT BEARING RESTRICTIONS: Yes  FALLS:  Has patient fallen in last 6 months? Yes. Number of falls 1 03/24/24 followed by surgery   LIVING ENVIRONMENT: Lives with: lives with their family Lives in: House/apartment Stairs:Yes, internal and external with railing Has following equipment at home: Crutches and knee scooter  OCCUPATION: Interpreter  PLOF: Independent  PATIENT GOALS: To be able to walk independently, driving to return to work, return to normal (including cleaning, shopping, cooking)   NEXT MD VISIT: 09/12/2024 with referring provider  OBJECTIVE:  Note: Objective  measures were completed at Evaluation unless otherwise noted.  DIAGNOSTIC FINDINGS:   Per referring provider:  Radiographic Exam RT foot 04/13/2024:  Interval resection of the posterior heel spur noted on lateral view.  PATIENT SURVEYS:  LEFS   Extreme difficulty/unable (0), Quite a bit of difficulty (1), Moderate difficulty (2), Little difficulty (3), No difficulty (4) Survey date:  06/21/2024  Any of your usual work, housework or school activities 1  2. Usual hobbies, recreational or sporting activities 4  3. Getting into/out of the bath 3  4. Walking between rooms 4  5. Putting on socks/shoes 4  6. Squatting  1  7. Lifting an object, like a bag of groceries from the floor 4  8. Performing light activities around your home 3  9. Performing heavy activities around your home 1  10. Getting into/out of a car 4  11. Walking 2 blocks 4  12. Walking 1 mile 3  13. Going up/down 10 stairs (1 flight) 2  14. Standing for 1 hour 2  15.  sitting for 1 hour 4  16. Running on even ground 0  17. Running on uneven ground 0  18. Making sharp turns while running fast 0  19. Hopping  0  20. Rolling over in bed 3  Score total:  47/80     COGNITION: Overall cognitive status: Within functional limits for tasks assessed      EDEMA:  Circumferential: R 9.25 L 8.25  Figure 8: R 9 L 8   LOWER EXTREMITY ROM:  Active ROM Right eval Left eval Right 07/19/2024  Hip flexion     Hip extension     Hip abduction     Hip adduction     Hip internal rotation     Hip external rotation     Knee flexion     Knee extension     Ankle dorsiflexion -10 5 6   Ankle plantarflexion 40  40  Ankle inversion 45  45  Ankle eversion 8  12   (Blank rows = not tested)  LOWER EXTREMITY MMT:  MMT Right 07/19/2024  Left 07/19/2024   Hip flexion    Hip extension    Hip abduction    Hip adduction    Hip internal rotation    Hip external rotation    Knee flexion    Knee extension    Ankle  dorsiflexion    Ankle plantarflexion    Ankle inversion    Ankle eversion     (Blank rows = not tested)   GAIT: Distance walked: 50 feet from lobby to elevation room  Assistive device utilized: None Level of assistance: Modified independence with boot Comments: mechanics impacted d/t CAM boot donned. Resulting antalgic gait pattern  TREATMENT DATE:   [facility] Adult PT Treatment:                                                DATE: 07/19/2024  Therapeutic Exercise: Seated ankle pumps 2x20 Seated ankle circles x20 CW/CCW  Therapeutic Activity  Weight shifting lat x1' ea STS x 5 without CAM boot  Patient education regarding post-op expectations and weaning from boot, healing and swelling expectations, encouraged to f/u with DPM with other concerns  Manual Therapy:  Lymphatic massage for edema management   [facility] Adult PT Treatment:                                                DATE: 07/13/24 Therapeutic Exercise: Seated ankle pumps 2x20 Seated ankle circles x20 CW/CCW Ankle inv/ev towel x1' Seated towel scrunches x1' Therapeutic Activity in // bars: Weight shifting AP/lat x1' ea Fwd step on RLE with focus on heel strike x1' Fwd ambulation - cues for heel strike, toe off, and increasing stance time on RLE - x3 laps Sidestepping x3 laps   [facility] Adult PT Treatment:                                                DATE: 07/06/24 Therapeutic Exercise: Seated ankle pumps 2x20 Seated ankle circles x20 CW/CCW Ankle inv/ev towel x1' Seated towel scrunches 2x1' Seated rocker board AP 2x1' Therapeutic Activity: Weight shifting holding onto FWW  AP/lat x1' ea with discussion of slowly transitioning out of boot, working on weight bearing at home outside of boot for short distances  [facility] Adult PT Treatment:                                                DATE:  06/28/24 Therapeutic Exercise: Seated ankle pumps 2x20 Seated ankle circles x20 CW/CCW Ankle inv/ev towel x20 ea Seated towel scrunches 2x1' Seated rocker board AP 2x1' Therapeutic Activity: STS 2x10    PATIENT EDUCATION:  Education details: reviewed initial home exercise program; discussion of POC, prognosis and goals for skilled PT   Person educated: Patient Education method: Explanation, Demonstration, and Handouts Education comprehension: verbalized understanding, returned demonstration, and needs further education  HOME EXERCISE PROGRAM: Access Code: [email or web address] URL: [email or web address] Date: 07/06/2024 Prepared by: Corean Pouch  Exercises - Forward Step Up with Counter Support  - 3 x daily - 7 x weekly - Seated Ankle Circles  - 1 x daily - 7 x weekly - 2 sets - 10 reps - Seated Ankle Pumps on Table  - 1 x daily - 7 x weekly - 2 sets - 10 reps - Ankle Inversion Eversion Towel Slide  - 1 x daily - 7 x weekly - 2 sets - 10 reps - Seated Toe Curl  - 1 x daily - 7 x weekly - 2 sets - 10 reps - Toe Spreading  - 1 x daily - 7 x weekly - 2 sets - 10 reps - Side  to Side Weight Shift with Counter Support  - 1 x daily - 7 x weekly - 3 sets - 10 reps  ASSESSMENT:  CLINICAL IMPRESSION: Patient is encouraged to f/u with referring provider regarding concerns about CL heel pain,and swelling. However, we plan to continue progression of WB activities and weaning from CAM boot. She may benefit from thermal modalities at future visits.   EVAL: Lalania is a 63 y.o. female who was seen today for physical therapy evaluation and treatment for ankle pain with mobility deficits, 10 weeks s/p achilles tendon repair with posterior heel spur resection. She also presents with localized edema and difficulty walking. 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 She requires skilled PT services at this time to address relevant deficits and improve overall function.     OBJECTIVE IMPAIRMENTS: Abnormal gait, decreased activity tolerance, decreased balance, decreased endurance, decreased mobility, difficulty walking, decreased ROM, decreased strength, increased edema, and pain.   ACTIVITY LIMITATIONS: carrying, lifting, bending, standing, squatting, stairs, dressing, and locomotion level  PARTICIPATION LIMITATIONS: meal prep, cleaning, laundry, interpersonal relationship, driving, shopping, community activity, and occupation  PERSONAL FACTORS: Past/current experiences and 1 comorbidity: HTN are also affecting patient's functional outcome.   REHAB POTENTIAL: Good  CLINICAL DECISION MAKING: Stable/uncomplicated  EVALUATION COMPLEXITY: Low   GOALS: Goals reviewed with patient? YES  SHORT TERM GOALS: Target date: 07/19/2024   Patient will be independent with initial home program at least 3 days/week.  Baseline: provided at eval Goal Status: INITIAL   2.  Patient will demonstrate improved DF AROM to at least neutral (0 degrees) Baseline: lacking 10 degrees Goal status: INITIAL   LONG TERM GOALS: Target date: 08/16/2024   Patient will report improved overall functional ability with LEFS score of 65/80 or greater.  Baseline: 47/80 Goal Status: INITIAL    2.  Patient will demonstrate at least 4+/5 R ankle MMT scores Goal status: INITIAL  3.  Patient will report ability to tolerate at least 20 minutes of driving in order to return to work-related traveling without exacerbation of pain or localized swelling.  Baseline: unable  Goal status: INITIAL  4.  Patient will demonstrate ability to tolerate at least 20 minutes of weight bearing activities in order to return to ADLs and work-related activities without exacerbation of pain or localized swelling.  Baseline: unable  Goal status: INITIAL     PLAN:  PT FREQUENCY:  1-2x/week  PT DURATION: 8 weeks  PLANNED INTERVENTIONS: 97164- PT Re-evaluation, 97750- Physical Performance Testing, 97110-Therapeutic exercises, 97530- Therapeutic activity, 97112- Neuromuscular re-education, 97535- Self Care, 02859- Manual therapy, [phone]- Gait training, [phone]- Aquatic Therapy, [phone]- Electrical stimulation (unattended), Patient/Family education, Balance training, Stair training, Taping, Joint mobilization, Cryotherapy, and Moist heat  PLAN FOR NEXT SESSION: begin ankle mobility and strengthening program, gradually progress WB as appropriate for post op status. Use of manual therapy and modalities as indicated    Marko Molt, PT, DPT  07/19/2024 7:05 PM

## 2024-07-21 ENCOUNTER — Ambulatory Visit

## 2024-07-21 DIAGNOSIS — R600 Localized edema: Secondary | ICD-10-CM

## 2024-07-21 DIAGNOSIS — R262 Difficulty in walking, not elsewhere classified: Secondary | ICD-10-CM

## 2024-07-21 DIAGNOSIS — M25571 Pain in right ankle and joints of right foot: Secondary | ICD-10-CM

## 2024-07-21 NOTE — Therapy (Signed)
 OUTPATIENT PHYSICAL THERAPY TREATMENT NOTE   Patient Name: Pam Hart MRN: [id number] DOB:07-14-1961, 63 y.o., female Today's Date: 07/21/2024  END OF SESSION:  PT End of Session - 07/21/24 1739     Visit Number 6    Number of Visits 16    Date for PT Re-Evaluation 08/16/24    Authorization Type [employer] MCD    Authorization - Visit Number 10    Authorization - Number of Visits 27    PT Start Time 1703    PT Stop Time 1745    PT Time Calculation (min) 42 min    Activity Tolerance Patient tolerated treatment well    Behavior During Therapy WFL for tasks assessed/performed             Past Medical History:  Diagnosis Date   Anemia    Arthritis    pt requested removal   Fatty liver    GERD (gastroesophageal reflux disease)    Hypertension    Thyroid  disease    pt requested removal   Past Surgical History:  Procedure Laterality Date   ACHILLES TENDON SURGERY Right 04/08/2024   Procedure: REPAIR, TENDON, ACHILLES;  Surgeon: Janit Thresa CHRISTELLA, DPM;  Location: [facility];  Service: Orthopedics/Podiatry;  Laterality: Right;   APPENDECTOMY     CARPAL TUNNEL RELEASE Bilateral    EYE SURGERY Right 11/17/2009   lasix    HEEL SPUR RESECTION Right 04/08/2024   Procedure: EXCISION, BONE SPUR, CALCANEUS;  Surgeon: Janit Thresa CHRISTELLA, DPM;  Location: [facility] OR[facility];  Service: Orthopedics/Podiatry;  Laterality: Right;   Patient Active Problem List   Diagnosis Date Noted   Sinus bradycardia 02/03/2020   Pre-syncope 02/02/2020   Right elbow pain 01/05/2017   Right hand pain 01/05/2017   Thyroid  nodule 07/29/2016   Neck pain 10/29/2015   Hypertension, uncontrolled 08/22/2015   Chest pain with moderate risk of acute coronary syndrome 08/22/2015   GERD (gastroesophageal reflux disease) 08/22/2015   Hyperlipidemia 03/17/2013   FATIGUE 08/05/2007    PCP: Domenick Loma, NP  REFERRING PROVIDER: Janit Thresa CHRISTELLA, DPM  REFERRING DIAG:  [phone] (ICD-10-CM) - Achilles tendon rupture, right,  initial encounter  M77.31 (ICD-10-CM) - Heel spur, right  S/p posterior heel spur resection with repair of ruptured Achilles tendon right.  DOS: 04/08/2024 -Currently WBAT in cam boot -Okay to begin to slowly transition the patient out of the cam boot into tennis shoes.   -Strengthening against light resistance -Range of motion exercises  THERAPY DIAG:  Pain in right ankle and joints of right foot  Localized edema  Difficulty in walking, not elsewhere classified  Rationale for Evaluation and Treatment: Rehabilitation  Next MD visit: October  ONSET DATE: 04/08/2024 DOS (posterior heel spur resection with Achilles Tendon repair  SUBJECTIVE:   SUBJECTIVE STATEMENT: Patient reports that she is having some pain and swelling. She has been able to walk without the boot for 2-3 hours/day.   EVAL: Patient present to PT 10 weeks s/p Achilles Tendon repair and posterior heel spur resection. She states that she has been in the CAM boot for just over a month and is working on mobility exercises.   PERTINENT HISTORY: Relevant PMHx includes HTN and post op status   PAIN:  Are you having pain? Yes: NPRS scale: 4-5/10 current Pain location: posterior ankle and R calf  Pain description: aching, numbness/tingling, tenderness Aggravating factors: walking, sitting with legs down   Relieving factors: rest   PRECAUTIONS: None  RED FLAGS: None   WEIGHT  BEARING RESTRICTIONS: Yes    FALLS:  Has patient fallen in last 6 months? Yes. Number of falls 1 03/24/24 followed by surgery   LIVING ENVIRONMENT: Lives with: lives with their family Lives in: House/apartment Stairs:Yes, internal and external with railing Has following equipment at home: Crutches and knee scooter  OCCUPATION: Interpreter  PLOF: Independent  PATIENT GOALS: To be able to walk independently, driving to return to work, return to normal (including cleaning, shopping, cooking)   NEXT MD VISIT: 09/12/2024 with referring  provider  OBJECTIVE:  Note: Objective measures were completed at Evaluation unless otherwise noted.  DIAGNOSTIC FINDINGS:   Per referring provider:  Radiographic Exam RT foot 04/13/2024:  Interval resection of the posterior heel spur noted on lateral view.  PATIENT SURVEYS:  LEFS   Extreme difficulty/unable (0), Quite a bit of difficulty (1), Moderate difficulty (2), Little difficulty (3), No difficulty (4) Survey date:  06/21/2024  Any of your usual work, housework or school activities 1  2. Usual hobbies, recreational or sporting activities 4  3. Getting into/out of the bath 3  4. Walking between rooms 4  5. Putting on socks/shoes 4  6. Squatting  1  7. Lifting an object, like a bag of groceries from the floor 4  8. Performing light activities around your home 3  9. Performing heavy activities around your home 1  10. Getting into/out of a car 4  11. Walking 2 blocks 4  12. Walking 1 mile 3  13. Going up/down 10 stairs (1 flight) 2  14. Standing for 1 hour 2  15.  sitting for 1 hour 4  16. Running on even ground 0  17. Running on uneven ground 0  18. Making sharp turns while running fast 0  19. Hopping  0  20. Rolling over in bed 3  Score total:  47/80     COGNITION: Overall cognitive status: Within functional limits for tasks assessed      EDEMA:  Circumferential: R 9.25 L 8.25  Figure 8: R 9 L 8   LOWER EXTREMITY ROM:  Active ROM Right eval Left eval Right 07/19/2024  Hip flexion     Hip extension     Hip abduction     Hip adduction     Hip internal rotation     Hip external rotation     Knee flexion     Knee extension     Ankle dorsiflexion -10 5 6   Ankle plantarflexion 40  40  Ankle inversion 45  45  Ankle eversion 8  12   (Blank rows = not tested)  LOWER EXTREMITY MMT:  MMT Right  Left    Hip flexion    Hip extension    Hip abduction    Hip adduction    Hip internal rotation    Hip external rotation    Knee flexion    Knee  extension    Ankle dorsiflexion    Ankle plantarflexion    Ankle inversion    Ankle eversion     (Blank rows = not tested)   GAIT: Distance walked: 50 feet from lobby to elevation room  Assistive device utilized: None Level of assistance: Modified independence with boot Comments: mechanics impacted d/t CAM boot donned. Resulting antalgic gait pattern  TREATMENT DATE:    [facility] Adult PT Treatment:                                                DATE: 07/21/2024  Therapeutic Exercise: Seated ankle pumps 2x20 Seated ankle circles x20 CW/CCW [place] x 2 minutes  Therapeutic Activity  Weight Shift lateral x 2' (4 step to firm foam pad) Staggered stance  Weight Shift forward x 2' (4 step to firm foam pad) STS x 10 with shoes donned  Lateral step overs (hurdles x 6) x 4 laps Forward step overs (hurdles x 6) x 3 laps  Patient education regarding post-op expectations and weaning from boot, healing and swelling expectations, encouraged to f/u with DPM with other concerns  Modalities:  (Not billed) Cryotherapy x 10 minutes      PATIENT EDUCATION:  Education details: reviewed initial home exercise program; discussion of POC, prognosis and goals for skilled PT   Person educated: Patient Education method: Explanation, Demonstration, and Handouts Education comprehension: verbalized understanding, returned demonstration, and needs further education  HOME EXERCISE PROGRAM: Access Code: [email or web address] URL: [email or web address] Date: 07/06/2024 Prepared by: Corean Pouch  Exercises - Forward Step Up with Counter Support  - 3 x daily - 7 x weekly - Seated Ankle Circles  - 1 x daily - 7 x weekly - 2 sets - 10 reps - Seated Ankle Pumps on Table  - 1 x daily - 7 x weekly - 2 sets - 10 reps - Ankle Inversion Eversion Towel Slide  - 1 x daily  - 7 x weekly - 2 sets - 10 reps - Seated Toe Curl  - 1 x daily - 7 x weekly - 2 sets - 10 reps - Toe Spreading  - 1 x daily - 7 x weekly - 2 sets - 10 reps - Side to Side Weight Shift with Counter Support  - 1 x daily - 7 x weekly - 3 sets - 10 reps  ASSESSMENT:  CLINICAL IMPRESSION: Pam Hart had good tolerance of today's session, which included progression of WB activities with shoes donned. She reported some mm fatigue and sharp pain towards end of WB activities. This was mostly resolved following cryotherapy at end of session. Patient is encouraged to continue weaning from boot over the next 1-2 weeks. Plan to transition completely out of boot after that time frame, at as tolerated.   EVAL: Pam Hart is a 63 y.o. female who was seen today for physical therapy evaluation and treatment for ankle pain with mobility deficits, 10 weeks s/p achilles tendon repair with posterior heel spur resection. She also presents with localized edema and difficulty walking. 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 She requires skilled PT services at this time to address relevant deficits and improve overall function.     OBJECTIVE IMPAIRMENTS: Abnormal gait, decreased activity tolerance, decreased balance, decreased endurance, decreased mobility, difficulty walking, decreased ROM, decreased strength, increased edema, and pain.   ACTIVITY LIMITATIONS: carrying, lifting, bending, standing, squatting, stairs, dressing, and locomotion level  PARTICIPATION LIMITATIONS: meal prep, cleaning, laundry, interpersonal relationship, driving, shopping, community activity, and occupation  PERSONAL FACTORS: Past/current experiences and 1 comorbidity: HTN are also affecting patient's functional outcome.   REHAB POTENTIAL: Good  CLINICAL DECISION MAKING: Stable/uncomplicated  EVALUATION  COMPLEXITY: Low   GOALS: Goals reviewed with patient? YES  SHORT TERM GOALS: Target date: 07/19/2024   Patient will be independent with initial home program at least 3 days/week.  Baseline: provided at eval Goal Status: INITIAL   2.  Patient will demonstrate improved DF AROM to at least neutral (0 degrees) Baseline: lacking 10 degrees Goal status: INITIAL   LONG TERM GOALS: Target date: 08/16/2024   Patient will report improved overall functional ability with LEFS score of 65/80 or greater.  Baseline: 47/80 Goal Status: INITIAL    2.  Patient will demonstrate at least 4+/5 R ankle MMT scores Goal status: INITIAL  3.  Patient will report ability to tolerate at least 20 minutes of driving in order to return to work-related traveling without exacerbation of pain or localized swelling.  Baseline: unable  Goal status: INITIAL  4.  Patient will demonstrate ability to tolerate at least 20 minutes of weight bearing activities in order to return to ADLs and work-related activities without exacerbation of pain or localized swelling.  Baseline: unable  Goal status: INITIAL     PLAN:  PT FREQUENCY: 1-2x/week  PT DURATION: 8 weeks  PLANNED INTERVENTIONS: 97164- PT Re-evaluation, 97750- Physical Performance Testing, 97110-Therapeutic exercises, 97530- Therapeutic activity, 97112- Neuromuscular re-education, 97535- Self Care, 02859- Manual therapy, [phone]- Gait training, [phone]- Aquatic Therapy, [phone]- Electrical stimulation (unattended), Patient/Family education, Balance training, Stair training, Taping, Joint mobilization, Cryotherapy, and Moist heat  PLAN FOR NEXT SESSION: begin ankle mobility and strengthening program, gradually progress WB as appropriate for post op status. Use of manual therapy and modalities as indicated    Marko Molt, PT, DPT  07/21/2024 6:32 PM

## 2024-07-30 ENCOUNTER — Ambulatory Visit: Payer: Self-pay

## 2024-07-30 NOTE — Therapy (Incomplete)
 OUTPATIENT PHYSICAL THERAPY TREATMENT NOTE   Patient Name: ANIKAH HOGGE MRN: [id number] DOB:[birth year], 63 y.o., female Today's Date: 07/30/2024  END OF SESSION:       Past Medical History:  Diagnosis Date   Anemia    Arthritis    pt requested removal   Fatty liver    GERD (gastroesophageal reflux disease)    Hypertension    Thyroid  disease    pt requested removal   Past Surgical History:  Procedure Laterality Date   ACHILLES TENDON SURGERY Right 04/08/2024   Procedure: REPAIR, TENDON, ACHILLES;  Surgeon: Janit Thresa CHRISTELLA, DPM;  Location: [facility];  Service: Orthopedics/Podiatry;  Laterality: Right;   APPENDECTOMY     CARPAL TUNNEL RELEASE Bilateral    EYE SURGERY Right 11/17/2009   lasix    HEEL SPUR RESECTION Right 04/08/2024   Procedure: EXCISION, BONE SPUR, CALCANEUS;  Surgeon: Janit Thresa CHRISTELLA, DPM;  Location: [facility];  Service: Orthopedics/Podiatry;  Laterality: Right;   Patient Active Problem List   Diagnosis Date Noted   Sinus bradycardia 02/03/2020   Pre-syncope 02/02/2020   Right elbow pain 01/05/2017   Right hand pain 01/05/2017   Thyroid  nodule 07/29/2016   Neck pain 10/29/2015   Hypertension, uncontrolled 08/22/2015   Chest pain with moderate risk of acute coronary syndrome 08/22/2015   GERD (gastroesophageal reflux disease) 08/22/2015   Hyperlipidemia 03/17/2013   FATIGUE 08/05/2007    PCP: Domenick Loma, NP  REFERRING PROVIDER: Janit Thresa CHRISTELLA, DPM  REFERRING DIAG:  [phone] (ICD-10-CM) - Achilles tendon rupture, right, initial encounter  M77.31 (ICD-10-CM) - Heel spur, right  S/p posterior heel spur resection with repair of ruptured Achilles tendon right.  DOS: 04/08/2024 -Currently WBAT in cam boot -Okay to begin to slowly transition the patient out of the cam boot into tennis shoes.   -Strengthening against light resistance -Range of motion exercises  THERAPY DIAG:  No diagnosis found.  Rationale for Evaluation and Treatment:  Rehabilitation  Next MD visit: October  ONSET DATE: 04/08/2024 DOS (posterior heel spur resection with Achilles Tendon repair  SUBJECTIVE:   SUBJECTIVE STATEMENT: ***  Patient reports that she is having some pain and swelling. She has been able to walk without the boot for 2-3 hours/day.   EVAL: Patient present to PT 10 weeks s/p Achilles Tendon repair and posterior heel spur resection. She states that she has been in the CAM boot for just over a month and is working on mobility exercises.   PERTINENT HISTORY: Relevant PMHx includes HTN and post op status   PAIN:  Are you having pain? Yes: NPRS scale: 4-5/10 current Pain location: posterior ankle and R calf  Pain description: aching, numbness/tingling, tenderness Aggravating factors: walking, sitting with legs down   Relieving factors: rest   PRECAUTIONS: None  RED FLAGS: None   WEIGHT BEARING RESTRICTIONS: Yes    FALLS:  Has patient fallen in last 6 months? Yes. Number of falls 1 03/24/24 followed by surgery   LIVING ENVIRONMENT: Lives with: lives with their family Lives in: House/apartment Stairs:Yes, internal and external with railing Has following equipment at home: Crutches and knee scooter  OCCUPATION: Interpreter  PLOF: Independent  PATIENT GOALS: To be able to walk independently, driving to return to work, return to normal (including cleaning, shopping, cooking)   NEXT MD VISIT: 09/12/2024 with referring provider  OBJECTIVE:  Note: Objective measures were completed at Evaluation unless otherwise noted.  DIAGNOSTIC FINDINGS:   Per referring provider:  Radiographic Exam RT foot 04/13/2024:  Interval resection of the posterior heel spur noted on lateral view.  PATIENT SURVEYS:  LEFS   Extreme difficulty/unable (0), Quite a bit of difficulty (1), Moderate difficulty (2), Little difficulty (3), No difficulty (4) Survey date:  06/21/2024  Any of your usual work, housework or school activities 1  2. Usual  hobbies, recreational or sporting activities 4  3. Getting into/out of the bath 3  4. Walking between rooms 4  5. Putting on socks/shoes 4  6. Squatting  1  7. Lifting an object, like a bag of groceries from the floor 4  8. Performing light activities around your home 3  9. Performing heavy activities around your home 1  10. Getting into/out of a car 4  11. Walking 2 blocks 4  12. Walking 1 mile 3  13. Going up/down 10 stairs (1 flight) 2  14. Standing for 1 hour 2  15.  sitting for 1 hour 4  16. Running on even ground 0  17. Running on uneven ground 0  18. Making sharp turns while running fast 0  19. Hopping  0  20. Rolling over in bed 3  Score total:  47/80     COGNITION: Overall cognitive status: Within functional limits for tasks assessed      EDEMA:  Circumferential: R 9.25 L 8.25  Figure 8: R 9 L 8   LOWER EXTREMITY ROM:  Active ROM Right eval Left eval Right 07/19/2024  Hip flexion     Hip extension     Hip abduction     Hip adduction     Hip internal rotation     Hip external rotation     Knee flexion     Knee extension     Ankle dorsiflexion -10 5 6   Ankle plantarflexion 40  40  Ankle inversion 45  45  Ankle eversion 8  12   (Blank rows = not tested)  LOWER EXTREMITY MMT:  MMT Right  Left    Hip flexion    Hip extension    Hip abduction    Hip adduction    Hip internal rotation    Hip external rotation    Knee flexion    Knee extension    Ankle dorsiflexion    Ankle plantarflexion    Ankle inversion    Ankle eversion     (Blank rows = not tested)   GAIT: Distance walked: 50 feet from lobby to elevation room  Assistive device utilized: None Level of assistance: Modified independence with boot Comments: mechanics impacted d/t CAM boot donned. Resulting antalgic gait pattern                                                                                                                                 TREATMENT DATE:  [facility]  PT Treatment:  DATE: 07/30/24 Therapeutic Exercise: Seated ankle pumps 2x20 Seated ankle circles x20 CW/CCW [place] Pickups x 2 minutes Therapeutic Activity  Weight Shift lateral x 2' (4 step to firm foam pad) Staggered stance  Weight Shift forward x 2' (4 step to firm foam pad) STS x 10 with shoes donned  Lateral step overs (hurdles x 6) x 4 laps Forward step overs (hurdles x 6) x 3 laps  Patient education regarding post-op expectations and weaning from boot, healing and swelling expectations, encouraged to f/u with DPM with other concerns Modalities:  (Not billed) Cryotherapy x 10 minutes   [facility] Adult PT Treatment:                                                DATE: 07/21/2024  Therapeutic Exercise: Seated ankle pumps 2x20 Seated ankle circles x20 CW/CCW [place] Pickups x 2 minutes  Therapeutic Activity  Weight Shift lateral x 2' (4 step to firm foam pad) Staggered stance  Weight Shift forward x 2' (4 step to firm foam pad) STS x 10 with shoes donned  Lateral step overs (hurdles x 6) x 4 laps Forward step overs (hurdles x 6) x 3 laps  Patient education regarding post-op expectations and weaning from boot, healing and swelling expectations, encouraged to f/u with DPM with other concerns  Modalities:  (Not billed) Cryotherapy x 10 minutes      PATIENT EDUCATION:  Education details: reviewed initial home exercise program; discussion of POC, prognosis and goals for skilled PT   Person educated: Patient Education method: Explanation, Demonstration, and Handouts Education comprehension: verbalized understanding, returned demonstration, and needs further education  HOME EXERCISE PROGRAM: Access Code: [email or web address] URL: [email or web address] Date: 07/06/2024 Prepared by: Corean Pouch  Exercises - Forward Step Up with Counter Support  - 3 x daily - 7 x weekly - Seated Ankle Circles  - 1 x daily - 7 x weekly - 2  sets - 10 reps - Seated Ankle Pumps on Table  - 1 x daily - 7 x weekly - 2 sets - 10 reps - Ankle Inversion Eversion Towel Slide  - 1 x daily - 7 x weekly - 2 sets - 10 reps - Seated Toe Curl  - 1 x daily - 7 x weekly - 2 sets - 10 reps - Toe Spreading  - 1 x daily - 7 x weekly - 2 sets - 10 reps - Side to Side Weight Shift with Counter Support  - 1 x daily - 7 x weekly - 3 sets - 10 reps  ASSESSMENT:  CLINICAL IMPRESSION: ***  Valda had good tolerance of today's session, which included progression of WB activities with shoes donned. She reported some mm fatigue and sharp pain towards end of WB activities. This was mostly resolved following cryotherapy at end of session. Patient is encouraged to continue weaning from boot over the next 1-2 weeks. Plan to transition completely out of boot after that time frame, at as tolerated.   EVAL: Nakhia is a 63 y.o. female who was seen today for physical therapy evaluation and treatment for ankle pain with mobility deficits, 10 weeks s/p achilles tendon repair with posterior heel spur resection. She also presents with localized edema and difficulty walking. 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 She requires skilled PT services at this time to address relevant deficits and improve overall function.     OBJECTIVE IMPAIRMENTS: Abnormal gait, decreased activity tolerance, decreased balance, decreased endurance, decreased mobility, difficulty walking, decreased ROM, decreased strength, increased edema, and pain.   ACTIVITY LIMITATIONS: carrying, lifting, bending, standing, squatting, stairs, dressing, and locomotion level  PARTICIPATION LIMITATIONS: meal prep, cleaning, laundry, interpersonal relationship, driving, shopping, community activity, and occupation  PERSONAL FACTORS: Past/current experiences and 1  comorbidity: HTN are also affecting patient's functional outcome.   REHAB POTENTIAL: Good  CLINICAL DECISION MAKING: Stable/uncomplicated  EVALUATION COMPLEXITY: Low   GOALS: Goals reviewed with patient? YES  SHORT TERM GOALS: Target date: 07/19/2024   Patient will be independent with initial home program at least 3 days/week.  Baseline: provided at eval Goal Status: INITIAL   2.  Patient will demonstrate improved DF AROM to at least neutral (0 degrees) Baseline: lacking 10 degrees Goal status: INITIAL   LONG TERM GOALS: Target date: 08/16/2024   Patient will report improved overall functional ability with LEFS score of 65/80 or greater.  Baseline: 47/80 Goal Status: INITIAL    2.  Patient will demonstrate at least 4+/5 R ankle MMT scores Goal status: INITIAL  3.  Patient will report ability to tolerate at least 20 minutes of driving in order to return to work-related traveling without exacerbation of pain or localized swelling.  Baseline: unable  Goal status: INITIAL  4.  Patient will demonstrate ability to tolerate at least 20 minutes of weight bearing activities in order to return to ADLs and work-related activities without exacerbation of pain or localized swelling.  Baseline: unable  Goal status: INITIAL     PLAN:  PT FREQUENCY: 1-2x/week  PT DURATION: 8 weeks  PLANNED INTERVENTIONS: 97164- PT Re-evaluation, 97750- Physical Performance Testing, 97110-Therapeutic exercises, 97530- Therapeutic activity, 97112- Neuromuscular re-education, 97535- Self Care, [id number]- Manual therapy, [phone]- Gait training, [phone]- Aquatic Therapy, [phone]- Electrical stimulation (unattended), Patient/Family education, Balance training, Stair training, Taping, Joint mobilization, Cryotherapy, and Moist heat  PLAN FOR NEXT SESSION: begin ankle mobility and strengthening program, gradually progress WB as appropriate for post op status. Use of manual therapy and modalities as indicated     Corean Pouch PTA  07/30/2024 8:18 AM

## 2024-08-01 ENCOUNTER — Encounter: Payer: Self-pay | Admitting: Podiatry

## 2024-08-01 ENCOUNTER — Ambulatory Visit (INDEPENDENT_AMBULATORY_CARE_PROVIDER_SITE_OTHER)

## 2024-08-01 ENCOUNTER — Ambulatory Visit (INDEPENDENT_AMBULATORY_CARE_PROVIDER_SITE_OTHER): Admitting: Podiatry

## 2024-08-01 DIAGNOSIS — S86011A Strain of right Achilles tendon, initial encounter: Secondary | ICD-10-CM

## 2024-08-01 DIAGNOSIS — M7752 Other enthesopathy of left foot: Secondary | ICD-10-CM

## 2024-08-01 NOTE — Progress Notes (Signed)
   No chief complaint on file.   Subjective:  Patient presents today status post posterior heel spur resection with repair of Achilles tendon right lower extremity.  DOS: 04/08/2024.  She continues to wear the cam boot weightbearing as tolerated.  She has been going to physical therapy 1x/week.   Past Medical History:  Diagnosis Date   Anemia    Arthritis    pt requested removal   Fatty liver    GERD (gastroesophageal reflux disease)    Hypertension    Thyroid  disease    pt requested removal    Past Surgical History:  Procedure Laterality Date   ACHILLES TENDON SURGERY Right 04/08/2024   Procedure: REPAIR, TENDON, ACHILLES;  Surgeon: Janit Thresa HERO, DPM;  Location: [facility];  Service: Orthopedics/Podiatry;  Laterality: Right;   APPENDECTOMY     CARPAL TUNNEL RELEASE Bilateral    EYE SURGERY Right 11/17/2009   lasix    HEEL SPUR RESECTION Right 04/08/2024   Procedure: EXCISION, BONE SPUR, CALCANEUS;  Surgeon: Janit Thresa HERO, DPM;  Location: [facility];  Service: Orthopedics/Podiatry;  Laterality: Right;    No Known Allergies  Objective/Physical Exam Incision is nicely healed.  Muscle strength 5/5 all compartments.  Good range of motion to the ankle joint.  Good strength to the Achilles tendon.  Radiographic Exam RT foot 04/13/2024:  Interval resection of the posterior heel spur noted on lateral view.  Radiographic exam LT foot 08/01/2024: No acute fracture identified.  Small minute posterior heel spur noted on lateral view.  Assessment: 1. s/p posterior heel spur resection with repair of Achilles tendon right. DOS: 04/08/2024   Plan of Care:  -Patient was evaluated.   -Discontinue cam boot.  Recommend good supportive tennis shoes - Continue physical therapy [facility] [address] for an additional month to ensure that the patient has transitioned out of the cam boot into tennis shoes -From a surgical standpoint full activity no restrictions -Return to clinic PRN   Thresa EMERSON Janit, DPM [facility]  Dr. Thresa EMERSON Janit, DPM    [address] [place], [place] [address]                Office [phone]  Fax [phone]

## 2024-08-03 ENCOUNTER — Ambulatory Visit: Payer: Self-pay

## 2024-08-03 DIAGNOSIS — M25571 Pain in right ankle and joints of right foot: Secondary | ICD-10-CM

## 2024-08-03 DIAGNOSIS — R262 Difficulty in walking, not elsewhere classified: Secondary | ICD-10-CM

## 2024-08-03 DIAGNOSIS — R600 Localized edema: Secondary | ICD-10-CM

## 2024-08-03 NOTE — Therapy (Signed)
 OUTPATIENT PHYSICAL THERAPY TREATMENT NOTE   Patient Name: Pam Hart MRN: [id number] DOB:02/18/1961, 63 y.o., female Today's Date: 08/04/2024  END OF SESSION:  PT End of Session - 08/03/24 1611     Visit Number 7    Number of Visits 16    Date for PT Re-Evaluation 08/16/24    Authorization Type [employer] MCD    Authorization - Visit Number 11    Authorization - Number of Visits 27    PT Start Time 1615    PT Stop Time 1701    PT Time Calculation (min) 46 min    Activity Tolerance Patient tolerated treatment well    Behavior During Therapy WFL for tasks assessed/performed          Past Medical History:  Diagnosis Date   Anemia    Arthritis    pt requested removal   Fatty liver    GERD (gastroesophageal reflux disease)    Hypertension    Thyroid  disease    pt requested removal   Past Surgical History:  Procedure Laterality Date   ACHILLES TENDON SURGERY Right 04/08/2024   Procedure: REPAIR, TENDON, ACHILLES;  Surgeon: Janit Thresa CHRISTELLA, DPM;  Location: [facility];  Service: Orthopedics/Podiatry;  Laterality: Right;   APPENDECTOMY     CARPAL TUNNEL RELEASE Bilateral    EYE SURGERY Right 11/17/2009   lasix    HEEL SPUR RESECTION Right 04/08/2024   Procedure: EXCISION, BONE SPUR, CALCANEUS;  Surgeon: Janit Thresa CHRISTELLA, DPM;  Location: [facility];  Service: Orthopedics/Podiatry;  Laterality: Right;   Patient Active Problem List   Diagnosis Date Noted   Sinus bradycardia 02/03/2020   Pre-syncope 02/02/2020   Right elbow pain 01/05/2017   Right hand pain 01/05/2017   Thyroid  nodule 07/29/2016   Neck pain 10/29/2015   Hypertension, uncontrolled 08/22/2015   Chest pain with moderate risk of acute coronary syndrome 08/22/2015   GERD (gastroesophageal reflux disease) 08/22/2015   Hyperlipidemia 03/17/2013   FATIGUE 08/05/2007    PCP: Domenick Loma, NP  REFERRING PROVIDER: Janit Thresa CHRISTELLA, DPM  REFERRING DIAG:  [phone] (ICD-10-CM) - Achilles tendon rupture, right, initial  encounter  M77.31 (ICD-10-CM) - Heel spur, right  S/p posterior heel spur resection with repair of ruptured Achilles tendon right.  DOS: 04/08/2024 -Currently WBAT in cam boot -Okay to begin to slowly transition the patient out of the cam boot into tennis shoes.   -Strengthening against light resistance -Range of motion exercises  THERAPY DIAG:  Pain in right ankle and joints of right foot  Localized edema  Difficulty in walking, not elsewhere classified  Rationale for Evaluation and Treatment: Rehabilitation  Next MD visit: October  ONSET DATE: 04/08/2024 DOS (posterior heel spur resection with Achilles Tendon repair  SUBJECTIVE:   SUBJECTIVE STATEMENT: Patient reports that she saw her MD 2 days ago who told her to no longer use the boot, she has not been using it since then which has been going well. She has also tried driving short distances with no issues.   EVAL: Patient present to PT 10 weeks s/p Achilles Tendon repair and posterior heel spur resection. She states that she has been in the CAM boot for just over a month and is working on mobility exercises.   PERTINENT HISTORY: Relevant PMHx includes HTN and post op status   PAIN:  Are you having pain? Yes: NPRS scale: 4-5/10 current Pain location: posterior ankle and R calf  Pain description: aching, numbness/tingling, tenderness Aggravating factors: walking, sitting with legs  down   Relieving factors: rest   PRECAUTIONS: None  RED FLAGS: None   WEIGHT BEARING RESTRICTIONS: Yes    FALLS:  Has patient fallen in last 6 months? Yes. Number of falls 1 03/24/24 followed by surgery   LIVING ENVIRONMENT: Lives with: lives with their family Lives in: House/apartment Stairs:Yes, internal and external with railing Has following equipment at home: Crutches and knee scooter  OCCUPATION: Interpreter  PLOF: Independent  PATIENT GOALS: To be able to walk independently, driving to return to work, return to normal  (including cleaning, shopping, cooking)   NEXT MD VISIT: 09/12/2024 with referring provider  OBJECTIVE:  Note: Objective measures were completed at Evaluation unless otherwise noted.  DIAGNOSTIC FINDINGS:   Per referring provider:  Radiographic Exam RT foot 04/13/2024:  Interval resection of the posterior heel spur noted on lateral view.  PATIENT SURVEYS:  LEFS   Extreme difficulty/unable (0), Quite a bit of difficulty (1), Moderate difficulty (2), Little difficulty (3), No difficulty (4) Survey date:  06/21/2024  Any of your usual work, housework or school activities 1  2. Usual hobbies, recreational or sporting activities 4  3. Getting into/out of the bath 3  4. Walking between rooms 4  5. Putting on socks/shoes 4  6. Squatting  1  7. Lifting an object, like a bag of groceries from the floor 4  8. Performing light activities around your home 3  9. Performing heavy activities around your home 1  10. Getting into/out of a car 4  11. Walking 2 blocks 4  12. Walking 1 mile 3  13. Going up/down 10 stairs (1 flight) 2  14. Standing for 1 hour 2  15.  sitting for 1 hour 4  16. Running on even ground 0  17. Running on uneven ground 0  18. Making sharp turns while running fast 0  19. Hopping  0  20. Rolling over in bed 3  Score total:  47/80     COGNITION: Overall cognitive status: Within functional limits for tasks assessed      EDEMA:  Circumferential: R 9.25 L 8.25  Figure 8: R 9 L 8   LOWER EXTREMITY ROM:  Active ROM Right eval Left eval Right 07/19/2024  Hip flexion     Hip extension     Hip abduction     Hip adduction     Hip internal rotation     Hip external rotation     Knee flexion     Knee extension     Ankle dorsiflexion -10 5 6   Ankle plantarflexion 40  40  Ankle inversion 45  45  Ankle eversion 8  12   (Blank rows = not tested)  LOWER EXTREMITY MMT:  MMT Right  Left    Hip flexion    Hip extension    Hip abduction    Hip adduction     Hip internal rotation    Hip external rotation    Knee flexion    Knee extension    Ankle dorsiflexion    Ankle plantarflexion    Ankle inversion    Ankle eversion     (Blank rows = not tested)   GAIT: Distance walked: 50 feet from lobby to elevation room  Assistive device utilized: None Level of assistance: Modified independence with boot Comments: mechanics impacted d/t CAM boot donned. Resulting antalgic gait pattern  TREATMENT DATE:  [facility] Adult PT Treatment:                                                DATE: 08/03/24 Therapeutic Exercise: Seated ankle pumps 2x20 Seated ankle circles x20 CW/CCW Therapeutic Activity  Weight Shift lateral x 2' (4 step to firm foam pad) Staggered stance  Weight Shift forward x 2' (4 step to firm foam pad) Step ups to 4 step RLE leading x10 ea STS x 10 Alternating taps to hurdle 3x30 Lateral step overs (hurdles x 6) x 3 laps Forward step overs (hurdles x 6) x 3 laps  Modalities:  (Not billed) Cryotherapy x 10 minutes   [facility] Adult PT Treatment:                                                DATE: 07/21/2024  Therapeutic Exercise: Seated ankle pumps 2x20 Seated ankle circles x20 CW/CCW [place] Pickups x 2 minutes  Therapeutic Activity  Weight Shift lateral x 2' (4 step to firm foam pad) Staggered stance  Weight Shift forward x 2' (4 step to firm foam pad) STS x 10 with shoes donned  Lateral step overs (hurdles x 6) x 4 laps Forward step overs (hurdles x 6) x 3 laps  Patient education regarding post-op expectations and weaning from boot, healing and swelling expectations, encouraged to f/u with DPM with other concerns  Modalities:  (Not billed) Cryotherapy x 10 minutes      PATIENT EDUCATION:  Education details: reviewed initial home exercise program; discussion of POC, prognosis and goals for  skilled PT   Person educated: Patient Education method: Explanation, Demonstration, and Handouts Education comprehension: verbalized understanding, returned demonstration, and needs further education  HOME EXERCISE PROGRAM: Access Code: [email or web address] URL: [email or web address] Date: 07/06/2024 Prepared by: Corean Pouch  Exercises - Forward Step Up with Counter Support  - 3 x daily - 7 x weekly - Seated Ankle Circles  - 1 x daily - 7 x weekly - 2 sets - 10 reps - Seated Ankle Pumps on Table  - 1 x daily - 7 x weekly - 2 sets - 10 reps - Ankle Inversion Eversion Towel Slide  - 1 x daily - 7 x weekly - 2 sets - 10 reps - Seated Toe Curl  - 1 x daily - 7 x weekly - 2 sets - 10 reps - Toe Spreading  - 1 x daily - 7 x weekly - 2 sets - 10 reps - Side to Side Weight Shift with Counter Support  - 1 x daily - 7 x weekly - 3 sets - 10 reps  ASSESSMENT:  CLINICAL IMPRESSION: Patient presents to PT ambulating with no boot, stating her MD told her she no longer needed to wear it at her last follow up. She has also tried driving short distances and states this is going well. She describes some occasional pain in her forefoot. Session today focused on ambulation and weight bearing through RLE. She has some mild pain throughout as well as muscular fatigue. Provided with heel lift and patient agreeable to removing 1 layer each week. Patient was able to tolerate all prescribed exercises with no adverse effects. Patient continues to benefit from skilled  PT services and should be progressed as able to improve functional independence.   EVAL: Damari is a 63 y.o. female who was seen today for physical therapy evaluation and treatment for ankle pain with mobility deficits, 10 weeks s/p achilles tendon repair with posterior heel spur resection. She also presents with localized edema and difficulty walking. 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 She requires skilled PT services at this time to address relevant deficits and improve overall function.     OBJECTIVE IMPAIRMENTS: Abnormal gait, decreased activity tolerance, decreased balance, decreased endurance, decreased mobility, difficulty walking, decreased ROM, decreased strength, increased edema, and pain.   ACTIVITY LIMITATIONS: carrying, lifting, bending, standing, squatting, stairs, dressing, and locomotion level  PARTICIPATION LIMITATIONS: meal prep, cleaning, laundry, interpersonal relationship, driving, shopping, community activity, and occupation  PERSONAL FACTORS: Past/current experiences and 1 comorbidity: HTN are also affecting patient's functional outcome.   REHAB POTENTIAL: Good  CLINICAL DECISION MAKING: Stable/uncomplicated  EVALUATION COMPLEXITY: Low   GOALS: Goals reviewed with patient? YES  SHORT TERM GOALS: Target date: 07/19/2024   Patient will be independent with initial home program at least 3 days/week.  Baseline: provided at eval Goal Status: INITIAL   2.  Patient will demonstrate improved DF AROM to at least neutral (0 degrees) Baseline: lacking 10 degrees Goal status: INITIAL   LONG TERM GOALS: Target date: 08/16/2024   Patient will report improved overall functional ability with LEFS score of 65/80 or greater.  Baseline: 47/80 Goal Status: INITIAL    2.  Patient will demonstrate at least 4+/5 R ankle MMT scores Goal status: INITIAL  3.  Patient will report ability to tolerate at least 20 minutes of driving in order to return to work-related traveling without exacerbation of pain or localized swelling.  Baseline: unable  Goal status: INITIAL  4.  Patient will demonstrate ability to tolerate at least 20 minutes of weight bearing activities in order to return to ADLs and work-related activities without exacerbation of pain or localized swelling.   Baseline: unable  Goal status: INITIAL     PLAN:  PT FREQUENCY: 1-2x/week  PT DURATION: 8 weeks  PLANNED INTERVENTIONS: 97164- PT Re-evaluation, 97750- Physical Performance Testing, 97110-Therapeutic exercises, 97530- Therapeutic activity, 97112- Neuromuscular re-education, 97535- Self Care, [id number]- Manual therapy, [phone]- Gait training, [phone]- Aquatic Therapy, [phone]- Electrical stimulation (unattended), Patient/Family education, Balance training, Stair training, Taping, Joint mobilization, Cryotherapy, and Moist heat  PLAN FOR NEXT SESSION: begin ankle mobility and strengthening program, gradually progress WB as appropriate for post op status. Use of manual therapy and modalities as indicated    Corean Pouch PTA  08/04/2024 7:15 AM

## 2024-08-05 ENCOUNTER — Ambulatory Visit: Payer: Self-pay

## 2024-08-05 DIAGNOSIS — R600 Localized edema: Secondary | ICD-10-CM

## 2024-08-05 DIAGNOSIS — M25571 Pain in right ankle and joints of right foot: Secondary | ICD-10-CM | POA: Diagnosis not present

## 2024-08-05 DIAGNOSIS — R262 Difficulty in walking, not elsewhere classified: Secondary | ICD-10-CM

## 2024-08-05 DIAGNOSIS — M25671 Stiffness of right ankle, not elsewhere classified: Secondary | ICD-10-CM

## 2024-08-05 NOTE — Therapy (Signed)
 OUTPATIENT PHYSICAL THERAPY TREATMENT NOTE   Patient Name: Pam Hart MRN: [id number] DOB:[birth year], 63 y.o., female Today's Date: 08/05/2024  END OF SESSION:  PT End of Session - 08/05/24 1053     Visit Number 8    Number of Visits 16    Date for Recertification  08/16/24    Authorization Type [employer] MCD    Authorization - Visit Number 12    Authorization - Number of Visits 27    PT Start Time 1052    PT Stop Time 1135    PT Time Calculation (min) 43 min    Activity Tolerance Patient tolerated treatment well    Behavior During Therapy WFL for tasks assessed/performed           Past Medical History:  Diagnosis Date   Anemia    Arthritis    pt requested removal   Fatty liver    GERD (gastroesophageal reflux disease)    Hypertension    Thyroid  disease    pt requested removal   Past Surgical History:  Procedure Laterality Date   ACHILLES TENDON SURGERY Right 04/08/2024   Procedure: REPAIR, TENDON, ACHILLES;  Surgeon: Janit Thresa CHRISTELLA, DPM;  Location: [facility];  Service: Orthopedics/Podiatry;  Laterality: Right;   APPENDECTOMY     CARPAL TUNNEL RELEASE Bilateral    EYE SURGERY Right 11/17/2009   lasix    HEEL SPUR RESECTION Right 04/08/2024   Procedure: EXCISION, BONE SPUR, CALCANEUS;  Surgeon: Janit Thresa CHRISTELLA, DPM;  Location: [facility];  Service: Orthopedics/Podiatry;  Laterality: Right;   Patient Active Problem List   Diagnosis Date Noted   Sinus bradycardia 02/03/2020   Pre-syncope 02/02/2020   Right elbow pain 01/05/2017   Right hand pain 01/05/2017   Thyroid  nodule 07/29/2016   Neck pain 10/29/2015   Hypertension, uncontrolled 08/22/2015   Chest pain with moderate risk of acute coronary syndrome 08/22/2015   GERD (gastroesophageal reflux disease) 08/22/2015   Hyperlipidemia 03/17/2013   FATIGUE 08/05/2007    PCP: Domenick Loma, NP  REFERRING PROVIDER: Janit Thresa CHRISTELLA, DPM  REFERRING DIAG:  [phone] (ICD-10-CM) - Achilles tendon rupture, right,  initial encounter  M77.31 (ICD-10-CM) - Heel spur, right  S/p posterior heel spur resection with repair of ruptured Achilles tendon right.  DOS: 04/08/2024 -Currently WBAT in cam boot -Okay to begin to slowly transition the patient out of the cam boot into tennis shoes.   -Strengthening against light resistance -Range of motion exercises  THERAPY DIAG:  Pain in right ankle and joints of right foot  Localized edema  Difficulty in walking, not elsewhere classified  Stiffness of right ankle, not elsewhere classified  Rationale for Evaluation and Treatment: Rehabilitation  Next MD visit: October  ONSET DATE: 04/08/2024 DOS (posterior heel spur resection with Achilles Tendon repair  SUBJECTIVE:   SUBJECTIVE STATEMENT: Patient reports that she continues to have a burning feeling in her ankle.  EVAL: Patient present to PT 10 weeks s/p Achilles Tendon repair and posterior heel spur resection. She states that she has been in the CAM boot for just over a month and is working on mobility exercises.   PERTINENT HISTORY: Relevant PMHx includes HTN and post op status   PAIN:  Are you having pain? Yes: NPRS scale: 4-5/10 current Pain location: posterior ankle and R calf  Pain description: aching, numbness/tingling, tenderness Aggravating factors: walking, sitting with legs down   Relieving factors: rest   PRECAUTIONS: None  RED FLAGS: None   WEIGHT BEARING RESTRICTIONS: Yes  FALLS:  Has patient fallen in last 6 months? Yes. Number of falls 1 03/24/24 followed by surgery   LIVING ENVIRONMENT: Lives with: lives with their family Lives in: House/apartment Stairs:Yes, internal and external with railing Has following equipment at home: Crutches and knee scooter  OCCUPATION: Interpreter  PLOF: Independent  PATIENT GOALS: To be able to walk independently, driving to return to work, return to normal (including cleaning, shopping, cooking)   NEXT MD VISIT: 09/12/2024 with  referring provider  OBJECTIVE:  Note: Objective measures were completed at Evaluation unless otherwise noted.  DIAGNOSTIC FINDINGS:   Per referring provider:  Radiographic Exam RT foot 04/13/2024:  Interval resection of the posterior heel spur noted on lateral view.  PATIENT SURVEYS:  LEFS   Extreme difficulty/unable (0), Quite a bit of difficulty (1), Moderate difficulty (2), Little difficulty (3), No difficulty (4) Survey date:  06/21/2024  Any of your usual work, housework or school activities 1  2. Usual hobbies, recreational or sporting activities 4  3. Getting into/out of the bath 3  4. Walking between rooms 4  5. Putting on socks/shoes 4  6. Squatting  1  7. Lifting an object, like a bag of groceries from the floor 4  8. Performing light activities around your home 3  9. Performing heavy activities around your home 1  10. Getting into/out of a car 4  11. Walking 2 blocks 4  12. Walking 1 mile 3  13. Going up/down 10 stairs (1 flight) 2  14. Standing for 1 hour 2  15.  sitting for 1 hour 4  16. Running on even ground 0  17. Running on uneven ground 0  18. Making sharp turns while running fast 0  19. Hopping  0  20. Rolling over in bed 3  Score total:  47/80     COGNITION: Overall cognitive status: Within functional limits for tasks assessed      EDEMA:  Circumferential: R 9.25 L 8.25  Figure 8: R 9 L 8   LOWER EXTREMITY ROM:  Active ROM Right eval Left eval Right 07/19/2024  Hip flexion     Hip extension     Hip abduction     Hip adduction     Hip internal rotation     Hip external rotation     Knee flexion     Knee extension     Ankle dorsiflexion -10 5 6   Ankle plantarflexion 40  40  Ankle inversion 45  45  Ankle eversion 8  12   (Blank rows = not tested)  LOWER EXTREMITY MMT:  MMT Right  Left    Hip flexion    Hip extension    Hip abduction    Hip adduction    Hip internal rotation    Hip external rotation    Knee flexion     Knee extension    Ankle dorsiflexion    Ankle plantarflexion    Ankle inversion    Ankle eversion     (Blank rows = not tested)   GAIT: Distance walked: 50 feet from lobby to elevation room  Assistive device utilized: None Level of assistance: Modified independence with boot Comments: mechanics impacted d/t CAM boot donned. Resulting antalgic gait pattern  TREATMENT DATE:  [facility] Adult PT Treatment:                                                DATE: 08/05/24 Therapeutic Activity  Weight Shift lateral x 2' (4 step to firm foam pad) Staggered stance  Weight Shift forward x 2' (4 step to firm foam pad) Step ups to 4 step RLE leading 2x10 ea STS x 10 Alternating taps to hurdle 3x30 Lateral step overs (hurdles x 6) x 3 laps Forward step overs (hurdles x 6) x 3 laps  Modalities:  (Not billed) Cryotherapy: Vasopneumatic (Game Ready)  Location:  right ankle Time:  10 minutes Pressure:  low Temperature:  40 degrees  [facility] Adult PT Treatment:                                                DATE: 08/03/24 Therapeutic Exercise: Seated ankle pumps 2x20 Seated ankle circles x20 CW/CCW Therapeutic Activity  Weight Shift lateral x 2' (4 step to firm foam pad) Staggered stance  Weight Shift forward x 2' (4 step to firm foam pad) Step ups to 4 step RLE leading x10 ea STS x 10 Alternating taps to hurdle 3x30 Lateral step overs (hurdles x 6) x 3 laps Forward step overs (hurdles x 6) x 3 laps  Modalities:  (Not billed) Cryotherapy x 10 minutes   [facility] Adult PT Treatment:                                                DATE: 07/21/2024  Therapeutic Exercise: Seated ankle pumps 2x20 Seated ankle circles x20 CW/CCW [place] Pickups x 2 minutes  Therapeutic Activity  Weight Shift lateral x 2' (4 step to firm foam pad) Staggered stance  Weight Shift forward x  2' (4 step to firm foam pad) STS x 10 with shoes donned  Lateral step overs (hurdles x 6) x 4 laps Forward step overs (hurdles x 6) x 3 laps  Patient education regarding post-op expectations and weaning from boot, healing and swelling expectations, encouraged to f/u with DPM with other concerns  Modalities:  (Not billed) Cryotherapy x 10 minutes      PATIENT EDUCATION:  Education details: reviewed initial home exercise program; discussion of POC, prognosis and goals for skilled PT   Person educated: Patient Education method: Explanation, Demonstration, and Handouts Education comprehension: verbalized understanding, returned demonstration, and needs further education  HOME EXERCISE PROGRAM: Access Code: [email or web address] URL: [email or web address] Date: 07/06/2024 Prepared by: Corean Pouch  Exercises - Forward Step Up with Counter Support  - 3 x daily - 7 x weekly - Seated Ankle Circles  - 1 x daily - 7 x weekly - 2 sets - 10 reps - Seated Ankle Pumps on Table  - 1 x daily - 7 x weekly - 2 sets - 10 reps - Ankle Inversion Eversion Towel Slide  - 1 x daily - 7 x weekly - 2 sets - 10 reps - Seated Toe Curl  - 1 x daily - 7 x weekly - 2 sets - 10 reps - Toe Spreading  -  1 x daily - 7 x weekly - 2 sets - 10 reps - Side to Side Weight Shift with Counter Support  - 1 x daily - 7 x weekly - 3 sets - 10 reps  ASSESSMENT:  CLINICAL IMPRESSION: Patient presents to PT reporting increased pain and soreness in her ankle and heel. She states she has been driving a bit more. Session today continued to focus on weight bearing tasks and improving ambulation. Patient was able to tolerate all prescribed exercises with no adverse effects. Patient continues to benefit from skilled PT services and should be progressed as able to improve functional independence.   EVAL: Pam Hart is a 63 y.o. female who was seen today for physical therapy evaluation and treatment for ankle pain with mobility  deficits, 10 weeks s/p achilles tendon repair with posterior heel spur resection. She also presents with localized edema and difficulty walking. 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 She requires skilled PT services at this time to address relevant deficits and improve overall function.     OBJECTIVE IMPAIRMENTS: Abnormal gait, decreased activity tolerance, decreased balance, decreased endurance, decreased mobility, difficulty walking, decreased ROM, decreased strength, increased edema, and pain.   ACTIVITY LIMITATIONS: carrying, lifting, bending, standing, squatting, stairs, dressing, and locomotion level  PARTICIPATION LIMITATIONS: meal prep, cleaning, laundry, interpersonal relationship, driving, shopping, community activity, and occupation  PERSONAL FACTORS: Past/current experiences and 1 comorbidity: HTN are also affecting patient's functional outcome.   REHAB POTENTIAL: Good  CLINICAL DECISION MAKING: Stable/uncomplicated  EVALUATION COMPLEXITY: Low   GOALS: Goals reviewed with patient? YES  SHORT TERM GOALS: Target date: 07/19/2024   Patient will be independent with initial home program at least 3 days/week.  Baseline: provided at eval Goal Status: INITIAL   2.  Patient will demonstrate improved DF AROM to at least neutral (0 degrees) Baseline: lacking 10 degrees Goal status: INITIAL   LONG TERM GOALS: Target date: 08/16/2024   Patient will report improved overall functional ability with LEFS score of 65/80 or greater.  Baseline: 47/80 Goal Status: INITIAL    2.  Patient will demonstrate at least 4+/5 R ankle MMT scores Goal status: INITIAL  3.  Patient will report ability to tolerate at least 20 minutes of driving in order to return to work-related traveling without exacerbation of pain or localized swelling.  Baseline:  unable  Goal status: INITIAL  4.  Patient will demonstrate ability to tolerate at least 20 minutes of weight bearing activities in order to return to ADLs and work-related activities without exacerbation of pain or localized swelling.  Baseline: unable  Goal status: INITIAL     PLAN:  PT FREQUENCY: 1-2x/week  PT DURATION: 8 weeks  PLANNED INTERVENTIONS: [id number]- PT Re-evaluation, 97750- Physical Performance Testing, 97110-Therapeutic exercises, 97530- Therapeutic activity, 97112- Neuromuscular re-education, 97535- Self Care, [id number]- Manual therapy, [phone]- Gait training, [phone]- Aquatic Therapy, [phone]- Electrical stimulation (unattended), Patient/Family education, Balance training, Stair training, Taping, Joint mobilization, Cryotherapy, and Moist heat  PLAN FOR NEXT SESSION: begin ankle mobility and strengthening program, gradually progress WB as appropriate for post op status. Use of manual therapy and modalities as indicated    Corean Pouch PTA  08/05/2024 11:26 AM

## 2024-08-09 ENCOUNTER — Ambulatory Visit: Payer: Self-pay

## 2024-08-09 DIAGNOSIS — R262 Difficulty in walking, not elsewhere classified: Secondary | ICD-10-CM

## 2024-08-09 DIAGNOSIS — M25571 Pain in right ankle and joints of right foot: Secondary | ICD-10-CM | POA: Diagnosis not present

## 2024-08-09 DIAGNOSIS — R600 Localized edema: Secondary | ICD-10-CM

## 2024-08-09 DIAGNOSIS — M25671 Stiffness of right ankle, not elsewhere classified: Secondary | ICD-10-CM

## 2024-08-09 NOTE — Therapy (Signed)
 OUTPATIENT PHYSICAL THERAPY TREATMENT NOTE   Patient Name: Pam Hart MRN: [id number] DOB:[birth year], 63 y.o., female Today's Date: 08/09/2024  END OF SESSION:     Past Medical History:  Diagnosis Date   Anemia    Arthritis    pt requested removal   Fatty liver    GERD (gastroesophageal reflux disease)    Hypertension    Thyroid  disease    pt requested removal   Past Surgical History:  Procedure Laterality Date   ACHILLES TENDON SURGERY Right 04/08/2024   Procedure: REPAIR, TENDON, ACHILLES;  Surgeon: Janit Thresa CHRISTELLA, DPM;  Location: [facility];  Service: Orthopedics/Podiatry;  Laterality: Right;   APPENDECTOMY     CARPAL TUNNEL RELEASE Bilateral    EYE SURGERY Right 11/17/2009   lasix    HEEL SPUR RESECTION Right 04/08/2024   Procedure: EXCISION, BONE SPUR, CALCANEUS;  Surgeon: Janit Thresa CHRISTELLA, DPM;  Location: [facility];  Service: Orthopedics/Podiatry;  Laterality: Right;   Patient Active Problem List   Diagnosis Date Noted   Sinus bradycardia 02/03/2020   Pre-syncope 02/02/2020   Right elbow pain 01/05/2017   Right hand pain 01/05/2017   Thyroid  nodule 07/29/2016   Neck pain 10/29/2015   Hypertension, uncontrolled 08/22/2015   Chest pain with moderate risk of acute coronary syndrome 08/22/2015   GERD (gastroesophageal reflux disease) 08/22/2015   Hyperlipidemia 03/17/2013   FATIGUE 08/05/2007    PCP: Domenick Loma, NP  REFERRING PROVIDER: Janit Thresa CHRISTELLA, DPM  REFERRING DIAG:  [id number] (ICD-10-CM) - Achilles tendon rupture, right, initial encounter  M77.31 (ICD-10-CM) - Heel spur, right  S/p posterior heel spur resection with repair of ruptured Achilles tendon right.  DOS: 04/08/2024 -Currently WBAT in cam boot -Okay to begin to slowly transition the patient out of the cam boot into tennis shoes.   -Strengthening against light resistance -Range of motion exercises  THERAPY DIAG:  No diagnosis found.  Rationale for Evaluation and Treatment:  Rehabilitation  Next MD visit: October  ONSET DATE: 04/08/2024 DOS (posterior heel spur resection with Achilles Tendon repair  SUBJECTIVE:   SUBJECTIVE STATEMENT: Patient reports that she continues to have a burning feeling in her ankle.  EVAL: Patient present to PT 10 weeks s/p Achilles Tendon repair and posterior heel spur resection. She states that she has been in the CAM boot for just over a month and is working on mobility exercises.   PERTINENT HISTORY: Relevant PMHx includes HTN and post op status   PAIN:  Are you having pain? Yes: NPRS scale: 4-5/10 current Pain location: posterior ankle and R calf  Pain description: aching, numbness/tingling, tenderness Aggravating factors: walking, sitting with legs down   Relieving factors: rest   PRECAUTIONS: None  RED FLAGS: None   WEIGHT BEARING RESTRICTIONS: Yes    FALLS:  Has patient fallen in last 6 months? Yes. Number of falls 1 03/24/24 followed by surgery   LIVING ENVIRONMENT: Lives with: lives with their family Lives in: House/apartment Stairs:Yes, internal and external with railing Has following equipment at home: Crutches and knee scooter  OCCUPATION: Interpreter  PLOF: Independent  PATIENT GOALS: To be able to walk independently, driving to return to work, return to normal (including cleaning, shopping, cooking)   NEXT MD VISIT: 09/12/2024 with referring provider  OBJECTIVE:  Note: Objective measures were completed at Evaluation unless otherwise noted.  DIAGNOSTIC FINDINGS:   Per referring provider:  Radiographic Exam RT foot 04/13/2024:  Interval resection of the posterior heel spur noted on lateral view.  PATIENT SURVEYS:  LEFS   Extreme difficulty/unable (0), Quite a bit of difficulty (1), Moderate difficulty (2), Little difficulty (3), No difficulty (4) Survey date:  06/21/2024  Any of your usual work, housework or school activities 1  2. Usual hobbies, recreational or sporting activities 4  3.  Getting into/out of the bath 3  4. Walking between rooms 4  5. Putting on socks/shoes 4  6. Squatting  1  7. Lifting an object, like a bag of groceries from the floor 4  8. Performing light activities around your home 3  9. Performing heavy activities around your home 1  10. Getting into/out of a car 4  11. Walking 2 blocks 4  12. Walking 1 mile 3  13. Going up/down 10 stairs (1 flight) 2  14. Standing for 1 hour 2  15.  sitting for 1 hour 4  16. Running on even ground 0  17. Running on uneven ground 0  18. Making sharp turns while running fast 0  19. Hopping  0  20. Rolling over in bed 3  Score total:  47/80     COGNITION: Overall cognitive status: Within functional limits for tasks assessed      EDEMA:  Circumferential: R 9.25 L 8.25  Figure 8: R 9 L 8   LOWER EXTREMITY ROM:  Active ROM Right eval Left eval Right 07/19/2024  Hip flexion     Hip extension     Hip abduction     Hip adduction     Hip internal rotation     Hip external rotation     Knee flexion     Knee extension     Ankle dorsiflexion -10 5 6   Ankle plantarflexion 40  40  Ankle inversion 45  45  Ankle eversion 8  12   (Blank rows = not tested)  LOWER EXTREMITY MMT:  MMT Right  Left    Hip flexion    Hip extension    Hip abduction    Hip adduction    Hip internal rotation    Hip external rotation    Knee flexion    Knee extension    Ankle dorsiflexion    Ankle plantarflexion    Ankle inversion    Ankle eversion     (Blank rows = not tested)   GAIT: Distance walked: 50 feet from lobby to elevation room  Assistive device utilized: None Level of assistance: Modified independence with boot Comments: mechanics impacted d/t CAM boot donned. Resulting antalgic gait pattern                                                                                                                                 TREATMENT DATE:   [facility] Adult PT Treatment:  DATE: 08/09/2024  Therapeutic Activity  Weight Shift lateral x 2' (4 step to firm foam pad) Staggered stance  Weight Shift forward x 2' (4 step to firm foam pad) Step ups to 4 step RLE leading 2x10 ea STS x 10 Alternating taps to hurdle 3x30 Lateral step overs (hurdles x 4, airex pad, 4 step) x 2 laps Forward step overs (hurdles x 4, airex pad, 4 step) 2 x  laps   Modalities:  (Not billed) Cryotherapy: Vasopneumatic (Game Ready)  Location:  right ankle Time:  10 minutes Pressure:  low Temperature:  40 degrees  [facility] Adult PT Treatment:                                                DATE: 08/05/24 Therapeutic Activity  Weight Shift lateral x 2' (4 step to firm foam pad) Staggered stance  Weight Shift forward x 2' (4 step to firm foam pad) Step ups to 4 step RLE leading 2x10 ea STS x 10 Alternating taps to hurdle 3x30 Lateral step overs (hurdles x 6) x 3 laps Forward step overs (hurdles x 6) x 3 laps  Modalities:  (Not billed) Cryotherapy: Vasopneumatic (Game Ready)  Location:  right ankle Time:  10 minutes Pressure:  low Temperature:  40 degrees  [facility] Adult PT Treatment:                                                DATE: 08/03/24 Therapeutic Exercise: Seated ankle pumps 2x20 Seated ankle circles x20 CW/CCW Therapeutic Activity  Weight Shift lateral x 2' (4 step to firm foam pad) Staggered stance  Weight Shift forward x 2' (4 step to firm foam pad) Step ups to 4 step RLE leading x10 ea STS x 10 Alternating taps to hurdle 3x30 Lateral step overs (hurdles x 6) x 3 laps Forward step overs (hurdles x 6) x 3 laps  Modalities:  (Not billed) Cryotherapy x 10 minutes   [facility] Adult PT Treatment:                                                DATE: 07/21/2024  Therapeutic Exercise: Seated ankle pumps 2x20 Seated ankle circles x20 CW/CCW [place] Pickups x 2 minutes  Therapeutic Activity  Weight Shift lateral x 2' (4 step to firm foam pad) Staggered  stance  Weight Shift forward x 2' (4 step to firm foam pad) STS x 10 with shoes donned  Lateral step overs (hurdles x 6) x 4 laps Forward step overs (hurdles x 6) x 3 laps  Patient education regarding post-op expectations and weaning from boot, healing and swelling expectations, encouraged to f/u with DPM with other concerns  Modalities:  (Not billed) Cryotherapy x 10 minutes      PATIENT EDUCATION:  Education details: reviewed initial home exercise program; discussion of POC, prognosis and goals for skilled PT   Person educated: Patient Education method: Explanation, Demonstration, and Handouts Education comprehension: verbalized understanding, returned demonstration, and needs further education  HOME EXERCISE PROGRAM: Access Code: [email or web address] URL: [email or web address] Date: 07/06/2024 Prepared by: Corean Pouch  Exercises -  Forward Step Up with Counter Support  - 3 x daily - 7 x weekly - Seated Ankle Circles  - 1 x daily - 7 x weekly - 2 sets - 10 reps - Seated Ankle Pumps on Table  - 1 x daily - 7 x weekly - 2 sets - 10 reps - Ankle Inversion Eversion Towel Slide  - 1 x daily - 7 x weekly - 2 sets - 10 reps - Seated Toe Curl  - 1 x daily - 7 x weekly - 2 sets - 10 reps - Toe Spreading  - 1 x daily - 7 x weekly - 2 sets - 10 reps - Side to Side Weight Shift with Counter Support  - 1 x daily - 7 x weekly - 3 sets - 10 reps  ASSESSMENT:  CLINICAL IMPRESSION: 08/09/2024 ***   Patient presents to PT reporting increased pain and soreness in her ankle and heel. She states she has been driving a bit more. Session today continued to focus on weight bearing tasks and improving ambulation. Patient was able to tolerate all prescribed exercises with no adverse effects. Patient continues to benefit from skilled PT services and should be progressed as able to improve functional independence.   EVAL: Dollie is a 63 y.o. female who was seen today for physical therapy  evaluation and treatment for ankle pain with mobility deficits, 10 weeks s/p achilles tendon repair with posterior heel spur resection. She also presents with localized edema and difficulty walking. 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 She requires skilled PT services at this time to address relevant deficits and improve overall function.     OBJECTIVE IMPAIRMENTS: Abnormal gait, decreased activity tolerance, decreased balance, decreased endurance, decreased mobility, difficulty walking, decreased ROM, decreased strength, increased edema, and pain.   ACTIVITY LIMITATIONS: carrying, lifting, bending, standing, squatting, stairs, dressing, and locomotion level  PARTICIPATION LIMITATIONS: meal prep, cleaning, laundry, interpersonal relationship, driving, shopping, community activity, and occupation  PERSONAL FACTORS: Past/current experiences and 1 comorbidity: HTN are also affecting patient's functional outcome.   REHAB POTENTIAL: Good  CLINICAL DECISION MAKING: Stable/uncomplicated  EVALUATION COMPLEXITY: Low   GOALS: Goals reviewed with patient? YES  SHORT TERM GOALS: Target date: 07/19/2024   Patient will be independent with initial home program at least 3 days/week.  Baseline: provided at eval Goal Status: INITIAL   2.  Patient will demonstrate improved DF AROM to at least neutral (0 degrees) Baseline: lacking 10 degrees Goal status: INITIAL   LONG TERM GOALS: Target date: 08/16/2024   Patient will report improved overall functional ability with LEFS score of 65/80 or greater.  Baseline: 47/80 Goal Status: INITIAL    2.  Patient will demonstrate at least 4+/5 R ankle MMT scores Goal status: INITIAL  3.  Patient will report ability to tolerate at least 20 minutes of driving in order to return to work-related traveling without  exacerbation of pain or localized swelling.  Baseline: unable  Goal status: INITIAL  4.  Patient will demonstrate ability to tolerate at least 20 minutes of weight bearing activities in order to return to ADLs and work-related activities without exacerbation of pain or localized swelling.  Baseline: unable  Goal status: INITIAL     PLAN:  PT FREQUENCY: 1-2x/week  PT DURATION: 8 weeks  PLANNED INTERVENTIONS: 97164- PT Re-evaluation, 97750- Physical Performance Testing, 97110-Therapeutic exercises, 97530-  Therapeutic activity, [id number]- Neuromuscular re-education, [id number]- Self Care, [id number]- Manual therapy, [phone]- Gait training, [phone]- Aquatic Therapy, [phone]- Electrical stimulation (unattended), Patient/Family education, Balance training, Stair training, Taping, Joint mobilization, Cryotherapy, and Moist heat  PLAN FOR NEXT SESSION: begin ankle mobility and strengthening program, gradually progress WB as appropriate for post op status. Use of manual therapy and modalities as indicated    Marko Molt PT  08/09/2024 4:22 PM

## 2024-08-11 ENCOUNTER — Ambulatory Visit: Payer: Self-pay

## 2024-08-17 ENCOUNTER — Ambulatory Visit: Payer: Self-pay | Attending: Podiatrist

## 2024-08-17 DIAGNOSIS — M25671 Stiffness of right ankle, not elsewhere classified: Secondary | ICD-10-CM | POA: Insufficient documentation

## 2024-08-17 DIAGNOSIS — M25571 Pain in right ankle and joints of right foot: Secondary | ICD-10-CM | POA: Insufficient documentation

## 2024-08-17 DIAGNOSIS — R600 Localized edema: Secondary | ICD-10-CM | POA: Insufficient documentation

## 2024-08-17 DIAGNOSIS — R262 Difficulty in walking, not elsewhere classified: Secondary | ICD-10-CM | POA: Insufficient documentation

## 2024-08-24 ENCOUNTER — Ambulatory Visit: Payer: Self-pay

## 2024-08-24 DIAGNOSIS — M25671 Stiffness of right ankle, not elsewhere classified: Secondary | ICD-10-CM | POA: Diagnosis present

## 2024-08-24 DIAGNOSIS — M25571 Pain in right ankle and joints of right foot: Secondary | ICD-10-CM | POA: Diagnosis present

## 2024-08-24 DIAGNOSIS — R600 Localized edema: Secondary | ICD-10-CM | POA: Diagnosis present

## 2024-08-24 DIAGNOSIS — R262 Difficulty in walking, not elsewhere classified: Secondary | ICD-10-CM | POA: Diagnosis present

## 2024-08-24 NOTE — Therapy (Signed)
 OUTPATIENT PHYSICAL THERAPY TREATMENT NOTE PROGRESS NOTE   Patient Name: Pam Hart MRN: [id number] DOB:06/11/1961, 63 y.o., female Today's Date: 08/24/2024  END OF SESSION:  PT End of Session - 08/24/24 1800     Visit Number 10    Number of Visits 16    Date for Recertification  08/16/24    Authorization Type [employer] MCD    Authorization - Visit Number 14    Authorization - Number of Visits 27    PT Start Time 1755    Activity Tolerance Patient tolerated treatment well    Behavior During Therapy WFL for tasks assessed/performed              Past Medical History:  Diagnosis Date   Anemia    Arthritis    pt requested removal   Fatty liver    GERD (gastroesophageal reflux disease)    Hypertension    Thyroid  disease    pt requested removal   Past Surgical History:  Procedure Laterality Date   ACHILLES TENDON SURGERY Right 04/08/2024   Procedure: REPAIR, TENDON, ACHILLES;  Surgeon: Janit Thresa CHRISTELLA, DPM;  Location: [facility];  Service: Orthopedics/Podiatry;  Laterality: Right;   APPENDECTOMY     CARPAL TUNNEL RELEASE Bilateral    EYE SURGERY Right 11/17/2009   lasix    HEEL SPUR RESECTION Right 04/08/2024   Procedure: EXCISION, BONE SPUR, CALCANEUS;  Surgeon: Janit Thresa CHRISTELLA, DPM;  Location: [facility];  Service: Orthopedics/Podiatry;  Laterality: Right;   Patient Active Problem List   Diagnosis Date Noted   Sinus bradycardia 02/03/2020   Pre-syncope 02/02/2020   Right elbow pain 01/05/2017   Right hand pain 01/05/2017   Thyroid  nodule 07/29/2016   Neck pain 10/29/2015   Hypertension, uncontrolled 08/22/2015   Chest pain with moderate risk of acute coronary syndrome 08/22/2015   GERD (gastroesophageal reflux disease) 08/22/2015   Hyperlipidemia 03/17/2013   FATIGUE 08/05/2007    PCP: Domenick Loma, NP  REFERRING PROVIDER: Janit Thresa CHRISTELLA, DPM  REFERRING DIAG:  [id number] (ICD-10-CM) - Achilles tendon rupture, right, initial encounter  M77.31 (ICD-10-CM) -  Heel spur, right  S/p posterior heel spur resection with repair of ruptured Achilles tendon right.  DOS: 04/08/2024 -Currently WBAT in cam boot -Okay to begin to slowly transition the patient out of the cam boot into tennis shoes.   -Strengthening against light resistance -Range of motion exercises  THERAPY DIAG:  Pain in right ankle and joints of right foot  Localized edema  Difficulty in walking, not elsewhere classified  Stiffness of right ankle, not elsewhere classified  Rationale for Evaluation and Treatment: Rehabilitation  Next MD visit: October  ONSET DATE: 04/08/2024 DOS (posterior heel spur resection with Achilles Tendon repair  SUBJECTIVE:   SUBJECTIVE STATEMENT: Patient reports that she has been able to return to work, is able to drive, and perform her normal activities with minimal-to-no pain. However, she has some swelling in her ankle at the end of most days. She has requested for her job to only send her to locations in a radius.   EVAL: Patient present to PT 10 weeks s/p Achilles Tendon repair and posterior heel spur resection. She states that she has been in the CAM boot for just over a month and is working on mobility exercises.   PERTINENT HISTORY: Relevant PMHx includes HTN and post op status   PAIN:  Are you having pain? Yes: NPRS scale: 4-5/10 current Pain location: posterior ankle and R calf  Pain description:  aching, numbness/tingling, tenderness Aggravating factors: walking, sitting with legs down   Relieving factors: rest   PRECAUTIONS: None  RED FLAGS: None   WEIGHT BEARING RESTRICTIONS: Yes    FALLS:  Has patient fallen in last 6 months? Yes. Number of falls 1 03/24/24 followed by surgery   LIVING ENVIRONMENT: Lives with: lives with their family Lives in: House/apartment Stairs:Yes, internal and external with railing Has following equipment at home: Crutches and knee scooter  OCCUPATION: Interpreter  PLOF: Independent  PATIENT  GOALS: To be able to walk independently, driving to return to work, return to normal (including cleaning, shopping, cooking)   NEXT MD VISIT: 09/12/2024 with referring provider  OBJECTIVE:  Note: Objective measures were completed at Evaluation unless otherwise noted.  DIAGNOSTIC FINDINGS:   Per referring provider:  Radiographic Exam RT foot 04/13/2024:  Interval resection of the posterior heel spur noted on lateral view.  PATIENT SURVEYS:  LEFS   Extreme difficulty/unable (0), Quite a bit of difficulty (1), Moderate difficulty (2), Little difficulty (3), No difficulty (4) Survey date:  06/21/2024 08/24/24  Any of your usual work, housework or school activities 1 3  2. Usual hobbies, recreational or sporting activities 4 3  3. Getting into/out of the bath 3 4  4. Walking between rooms 4 4  5. Putting on socks/shoes 4 4  6. Squatting  1 2  7. Lifting an object, like a bag of groceries from the floor 4 3  8. Performing light activities around your home 3 3  9. Performing heavy activities around your home 1 3  10. Getting into/out of a car 4 4  11. Walking 2 blocks 4 3  12. Walking 1 mile 3 2  13. Going up/down 10 stairs (1 flight) 2 3  14. Standing for 1 hour 2 3  15.  sitting for 1 hour 4 4  16. Running on even ground 0 2  17. Running on uneven ground 0 1  18. Making sharp turns while running fast 0 1  19. Hopping  0 2  20. Rolling over in bed 3 4  Score total:  47/80 58/80     COGNITION: Overall cognitive status: Within functional limits for tasks assessed      EDEMA:  Circumferential: R 9.25 L 8.25  Figure 8: R 9 L 8   LOWER EXTREMITY ROM:  Active ROM Right eval Left eval Right 07/19/2024 Right 08/24/2024  Hip flexion      Hip extension      Hip abduction      Hip adduction      Hip internal rotation      Hip external rotation      Knee flexion      Knee extension      Ankle dorsiflexion -10 5 6 10   Ankle plantarflexion 40  40 40  Ankle inversion 45   45 40  Ankle eversion 8  12 15    (Blank rows = not tested)  LOWER EXTREMITY MMT:  MMT Right  Left    Hip flexion    Hip extension    Hip abduction    Hip adduction    Hip internal rotation    Hip external rotation    Knee flexion    Knee extension    Ankle dorsiflexion    Ankle plantarflexion    Ankle inversion    Ankle eversion     (Blank rows = not tested)   GAIT: Distance walked: 50 feet from lobby to elevation  room  Assistive device utilized: None Level of assistance: Modified independence with boot Comments: mechanics impacted d/t CAM boot donned. Resulting antalgic gait pattern                                                                                                                                 TREATMENT DATE:   [facility] Adult PT Treatment:                                                DATE: 08/24/2024  Therapeutic Activity:  Reassessment of objective measures and subjective assessment regarding progress towards established goals and updated plan for addressing remaining deficits and rehab goals.    [facility] Adult PT Treatment:                                                DATE: 08/09/2024  Therapeutic Activity  Weight Shift lateral x 2' (4 step to firm foam pad) Staggered stance  Weight Shift forward x 2' (4 step to firm foam pad) Step ups to 4 step RLE leading 2x10 ea STS x 10 Alternating taps to hurdle 3x30 Lateral step overs (hurdles x 4, airex pad, 4 step) x 2 laps Forward step overs (hurdles x 4, airex pad, 4 step) 2 x  laps  Marble Pick Ups x 1 round   Manual Therapy  Mid foot and talocrural mobilization grade II/III for improve mobility and pain modulation  Including related patient education regarding anatomy, indications for manual therapy today   [facility] Adult PT Treatment:                                                DATE: 08/05/24 Therapeutic Activity  Weight Shift lateral x 2' (4 step to firm foam pad) Staggered stance  Weight  Shift forward x 2' (4 step to firm foam pad) Step ups to 4 step RLE leading 2x10 ea STS x 10 Alternating taps to hurdle 3x30 Lateral step overs (hurdles x 6) x 3 laps Forward step overs (hurdles x 6) x 3 laps   Modalities:  (Not billed) Cryotherapy: Vasopneumatic (Game Ready)  Location:  right ankle Time:  10 minutes Pressure:  low Temperature:  40 degrees  [facility] Adult PT Treatment:                                                DATE: 08/03/24  Therapeutic Exercise: Seated ankle pumps 2x20 Seated ankle circles x20 CW/CCW Therapeutic Activity  Weight Shift lateral x 2' (4 step to firm foam pad) Staggered stance  Weight Shift forward x 2' (4 step to firm foam pad) Step ups to 4 step RLE leading x10 ea STS x 10 Alternating taps to hurdle 3x30 Lateral step overs (hurdles x 6) x 3 laps Forward step overs (hurdles x 6) x 3 laps  Modalities:  (Not billed) Cryotherapy x 10 minutes   [facility] Adult PT Treatment:                                                DATE: 07/21/2024  Therapeutic Exercise: Seated ankle pumps 2x20 Seated ankle circles x20 CW/CCW Marble Pickups x 2 minutes  Therapeutic Activity  Weight Shift lateral x 2' (4 step to firm foam pad) Staggered stance  Weight Shift forward x 2' (4 step to firm foam pad) STS x 10 with shoes donned  Lateral step overs (hurdles x 6) x 4 laps Forward step overs (hurdles x 6) x 3 laps  Patient education regarding post-op expectations and weaning from boot, healing and swelling expectations, encouraged to f/u with DPM with other concerns  Modalities:  (Not billed) Cryotherapy x 10 minutes      PATIENT EDUCATION:  Education details: reviewed initial home exercise program; discussion of POC, prognosis and goals for skilled PT   Person educated: Patient Education method: Explanation, Demonstration, and Handouts Education comprehension: verbalized understanding, returned demonstration, and needs further education  HOME EXERCISE  PROGRAM: Access Code: [email or web address] URL: [email or web address] Date: 07/06/2024 Prepared by: Corean Pouch  Exercises - Forward Step Up with Counter Support  - 3 x daily - 7 x weekly - Seated Ankle Circles  - 1 x daily - 7 x weekly - 2 sets - 10 reps - Seated Ankle Pumps on Table  - 1 x daily - 7 x weekly - 2 sets - 10 reps - Ankle Inversion Eversion Towel Slide  - 1 x daily - 7 x weekly - 2 sets - 10 reps - Seated Toe Curl  - 1 x daily - 7 x weekly - 2 sets - 10 reps - Toe Spreading  - 1 x daily - 7 x weekly - 2 sets - 10 reps - Side to Side Weight Shift with Counter Support  - 1 x daily - 7 x weekly - 3 sets - 10 reps    ASSESSMENT:  CLINICAL IMPRESSION: 08/24/2024 Pam Hart is demonstrating good improvement of R ankle AROM and gait mechanics. She continues to have limitations with extended WB activities. She requires ongoing skilled PT intervention in order to address remaining deficits, including beginning running progression and progress towards remaining rehab goals.     OBJECTIVE IMPAIRMENTS: Abnormal gait, decreased activity tolerance, decreased balance, decreased endurance, decreased mobility, difficulty walking, decreased ROM, decreased strength, increased edema, and pain.   ACTIVITY LIMITATIONS: carrying, lifting, bending, standing, squatting, stairs, dressing, and locomotion level  PARTICIPATION LIMITATIONS: meal prep, cleaning, laundry, interpersonal relationship, driving, shopping, community activity, and occupation  PERSONAL FACTORS: Past/current experiences and 1 comorbidity: HTN are also affecting patient's functional outcome.   REHAB POTENTIAL: Good  CLINICAL DECISION MAKING: Stable/uncomplicated  EVALUATION COMPLEXITY: Low   GOALS: Goals reviewed with patient? YES  SHORT TERM GOALS: Target date: 07/19/2024   Patient will be independent with initial  home program at least 3 days/week.  Baseline: provided at eval Goal Status: MET  2.  Patient  will demonstrate improved DF AROM to at least neutral (0 degrees) Baseline: lacking 10 degrees Goal status: MET   LONG TERM GOALS: Target date: 09/17/2024   Patient will report improved overall functional ability with LEFS score of 65/80 or greater.  Baseline: 47/80 10/08/225: 58/80; remaining challenge with prolonged weight bearing activities and running which patient would like to work towards  Goal Status: NOT MET   2.  Patient will demonstrate at least 4+/5 R ankle MMT scores Goal status: MET  3.  Patient will report ability to tolerate at least 20 minutes of driving in order to return to work-related traveling without exacerbation of pain or localized swelling.  Baseline: unable  Goal status: MET  4.  Patient will demonstrate ability to tolerate at least 20 minutes of weight bearing activities in order to return to ADLs and work-related activities without exacerbation of pain or localized swelling.  Baseline: unable  Goal status: MET     PLAN:  PT FREQUENCY: 1x/week  PT DURATION: 4 weeks   PLANNED INTERVENTIONS: 97164- PT Re-evaluation, 97750- Physical Performance Testing, 97110-Therapeutic exercises, 97530- Therapeutic activity, 97112- Neuromuscular re-education, 97535- Self Care, [id number]- Manual therapy, [phone]- Gait training, [phone]- Aquatic Therapy, [phone]- Electrical stimulation (unattended), Patient/Family education, Balance training, Stair training, Taping, Joint mobilization, Cryotherapy, and Moist heat  PLAN FOR NEXT SESSION: address gait mechanics and further progress WB interventions    Marko Molt, PT, DPT  08/26/2024 12:01 PM

## 2024-08-26 ENCOUNTER — Ambulatory Visit: Payer: Self-pay

## 2024-08-26 DIAGNOSIS — M25571 Pain in right ankle and joints of right foot: Secondary | ICD-10-CM | POA: Diagnosis not present

## 2024-08-26 DIAGNOSIS — R600 Localized edema: Secondary | ICD-10-CM

## 2024-08-26 DIAGNOSIS — R262 Difficulty in walking, not elsewhere classified: Secondary | ICD-10-CM

## 2024-08-26 NOTE — Addendum Note (Signed)
 Addended by: JOSHUA GUN on: 08/26/2024 12:03 PM   Modules accepted: Orders

## 2024-08-26 NOTE — Therapy (Signed)
 OUTPATIENT PHYSICAL THERAPY TREATMENT NOTE   Patient Name: Pam Hart MRN: [id number] DOB:11-Aug-1961, 64 y.o., female Today's Date: 08/26/2024  END OF SESSION:  PT End of Session - 08/26/24 1354     Visit Number 11    Number of Visits 16    Date for Recertification  09/17/24    Authorization Type [employer] MCD    Authorization - Visit Number 15    Authorization - Number of Visits 27    PT Start Time 1353    PT Stop Time 1425    PT Time Calculation (min) 32 min    Activity Tolerance Patient tolerated treatment well    Behavior During Therapy WFL for tasks assessed/performed               Past Medical History:  Diagnosis Date   Anemia    Arthritis    pt requested removal   Fatty liver    GERD (gastroesophageal reflux disease)    Hypertension    Thyroid  disease    pt requested removal   Past Surgical History:  Procedure Laterality Date   ACHILLES TENDON SURGERY Right 04/08/2024   Procedure: REPAIR, TENDON, ACHILLES;  Surgeon: Janit Thresa CHRISTELLA, DPM;  Location: [facility];  Service: Orthopedics/Podiatry;  Laterality: Right;   APPENDECTOMY     CARPAL TUNNEL RELEASE Bilateral    EYE SURGERY Right 11/17/2009   lasix    HEEL SPUR RESECTION Right 04/08/2024   Procedure: EXCISION, BONE SPUR, CALCANEUS;  Surgeon: Janit Thresa CHRISTELLA, DPM;  Location: [facility];  Service: Orthopedics/Podiatry;  Laterality: Right;   Patient Active Problem List   Diagnosis Date Noted   Sinus bradycardia 02/03/2020   Pre-syncope 02/02/2020   Right elbow pain 01/05/2017   Right hand pain 01/05/2017   Thyroid  nodule 07/29/2016   Neck pain 10/29/2015   Hypertension, uncontrolled 08/22/2015   Chest pain with moderate risk of acute coronary syndrome 08/22/2015   GERD (gastroesophageal reflux disease) 08/22/2015   Hyperlipidemia 03/17/2013   FATIGUE 08/05/2007    PCP: Domenick Loma, NP  REFERRING PROVIDER: Janit Thresa CHRISTELLA, DPM  REFERRING DIAG:  [phone] (ICD-10-CM) - Achilles tendon rupture,  right, initial encounter  M77.31 (ICD-10-CM) - Heel spur, right  S/p posterior heel spur resection with repair of ruptured Achilles tendon right.  DOS: 04/08/2024 -Currently WBAT in cam boot -Okay to begin to slowly transition the patient out of the cam boot into tennis shoes.   -Strengthening against light resistance -Range of motion exercises  THERAPY DIAG:  Pain in right ankle and joints of right foot  Localized edema  Difficulty in walking, not elsewhere classified  Rationale for Evaluation and Treatment: Rehabilitation  Next MD visit: October  ONSET DATE: 04/08/2024 DOS (posterior heel spur resection with Achilles Tendon repair  SUBJECTIVE:   SUBJECTIVE STATEMENT: Patient reports that she has been able to return to work, is able to drive, and perform her normal activities with minimal-to-no pain. However, she has some swelling in her ankle at the end of most days. She has requested for her job to only send her to locations in a radius.   EVAL: Patient present to PT 10 weeks s/p Achilles Tendon repair and posterior heel spur resection. She states that she has been in the CAM boot for just over a month and is working on mobility exercises.   PERTINENT HISTORY: Relevant PMHx includes HTN and post op status   PAIN:  Are you having pain? Yes: NPRS scale: 4-5/10 current Pain location: posterior  ankle and R calf  Pain description: aching, numbness/tingling, tenderness Aggravating factors: walking, sitting with legs down   Relieving factors: rest   PRECAUTIONS: None  RED FLAGS: None   WEIGHT BEARING RESTRICTIONS: Yes    FALLS:  Has patient fallen in last 6 months? Yes. Number of falls 1 03/24/24 followed by surgery   LIVING ENVIRONMENT: Lives with: lives with their family Lives in: House/apartment Stairs:Yes, internal and external with railing Has following equipment at home: Crutches and knee scooter  OCCUPATION: Interpreter  PLOF: Independent  PATIENT  GOALS: To be able to walk independently, driving to return to work, return to normal (including cleaning, shopping, cooking)   NEXT MD VISIT: 09/12/2024 with referring provider  OBJECTIVE:  Note: Objective measures were completed at Evaluation unless otherwise noted.  DIAGNOSTIC FINDINGS:   Per referring provider:  Radiographic Exam RT foot 04/13/2024:  Interval resection of the posterior heel spur noted on lateral view.  PATIENT SURVEYS:  LEFS   Extreme difficulty/unable (0), Quite a bit of difficulty (1), Moderate difficulty (2), Little difficulty (3), No difficulty (4) Survey date:  06/21/2024 08/24/24  Any of your usual work, housework or school activities 1 3  2. Usual hobbies, recreational or sporting activities 4 3  3. Getting into/out of the bath 3 4  4. Walking between rooms 4 4  5. Putting on socks/shoes 4 4  6. Squatting  1 2  7. Lifting an object, like a bag of groceries from the floor 4 3  8. Performing light activities around your home 3 3  9. Performing heavy activities around your home 1 3  10. Getting into/out of a car 4 4  11. Walking 2 blocks 4 3  12. Walking 1 mile 3 2  13. Going up/down 10 stairs (1 flight) 2 3  14. Standing for 1 hour 2 3  15.  sitting for 1 hour 4 4  16. Running on even ground 0 2  17. Running on uneven ground 0 1  18. Making sharp turns while running fast 0 1  19. Hopping  0 2  20. Rolling over in bed 3 4  Score total:  47/80 58/80     COGNITION: Overall cognitive status: Within functional limits for tasks assessed      EDEMA:  Circumferential: R 9.25 L 8.25  Figure 8: R 9 L 8   LOWER EXTREMITY ROM:  Active ROM Right eval Left eval Right 07/19/2024 Right 08/24/2024  Hip flexion      Hip extension      Hip abduction      Hip adduction      Hip internal rotation      Hip external rotation      Knee flexion      Knee extension      Ankle dorsiflexion -10 5 6 10   Ankle plantarflexion 40  40 40  Ankle inversion 45   45 40  Ankle eversion 8  12 15    (Blank rows = not tested)  LOWER EXTREMITY MMT:  MMT Right  Left    Hip flexion    Hip extension    Hip abduction    Hip adduction    Hip internal rotation    Hip external rotation    Knee flexion    Knee extension    Ankle dorsiflexion    Ankle plantarflexion    Ankle inversion    Ankle eversion     (Blank rows = not tested)   GAIT: Distance  walked: 50 feet from lobby to elevation room  Assistive device utilized: None Level of assistance: Modified independence with boot Comments: mechanics impacted d/t CAM boot donned. Resulting antalgic gait pattern                                                                                                                                 TREATMENT DATE:    [facility] Adult PT Treatment:                                                DATE: 08/09/2024  TM x 6 minutes, less than 1.0 mph for focus on heel strike Ankle pumps on dynadisc Pronation/supination on dynadisc Hurdle step overs fwd x 3 laps with cueing for heel strike  Standing heel raises 2 x 10 Standing toe raises 2 x 10 Standing Marches Updated and reviewed HEP    [facility] Adult PT Treatment:                                                DATE: 08/24/2024  Therapeutic Activity:  Reassessment of objective measures and subjective assessment regarding progress towards established goals and updated plan for addressing remaining deficits and rehab goals.    [facility] Adult PT Treatment:                                                DATE: 08/09/2024  Therapeutic Activity  Weight Shift lateral x 2' (4 step to firm foam pad) Staggered stance  Weight Shift forward x 2' (4 step to firm foam pad) Step ups to 4 step RLE leading 2x10 ea STS x 10 Alternating taps to hurdle 3x30 Lateral step overs (hurdles x 4, airex pad, 4 step) x 2 laps Forward step overs (hurdles x 4, airex pad, 4 step) 2 x  laps  [place] Pick Ups x 1 round   Manual Therapy   Mid foot and talocrural mobilization grade II/III for improve mobility and pain modulation  Including related patient education regarding anatomy, indications for manual therapy today     PATIENT EDUCATION:  Education details: reviewed initial home exercise program; discussion of POC, prognosis and goals for skilled PT   Person educated: Patient Education method: Explanation, Demonstration, and Handouts Education comprehension: verbalized understanding, returned demonstration, and needs further education  HOME EXERCISE PROGRAM: Access Code: [email or web address] URL: [email or web address] Date: 07/06/2024 Prepared by: Corean Pouch  Exercises - Forward Step Up with Counter Support  - 3 x daily - 7 x weekly - Seated Ankle Circles  -  1 x daily - 7 x weekly - 2 sets - 10 reps - Seated Ankle Pumps on Table  - 1 x daily - 7 x weekly - 2 sets - 10 reps - Ankle Inversion Eversion Towel Slide  - 1 x daily - 7 x weekly - 2 sets - 10 reps - Seated Toe Curl  - 1 x daily - 7 x weekly - 2 sets - 10 reps - Toe Spreading  - 1 x daily - 7 x weekly - 2 sets - 10 reps - Side to Side Weight Shift with Counter Support  - 1 x daily - 7 x weekly - 3 sets - 10 reps    ASSESSMENT:  CLINICAL IMPRESSION: 08/26/2024 Pam Hart had good tolerance of today's treatment session, which focused on addressing gait mechanics.  We will continue to progress per POC as tolerated, in order to reach established rehab goals.      OBJECTIVE IMPAIRMENTS: Abnormal gait, decreased activity tolerance, decreased balance, decreased endurance, decreased mobility, difficulty walking, decreased ROM, decreased strength, increased edema, and pain.   ACTIVITY LIMITATIONS: carrying, lifting, bending, standing, squatting, stairs, dressing, and locomotion level  PARTICIPATION LIMITATIONS: meal prep, cleaning, laundry, interpersonal relationship, driving, shopping, community activity, and occupation  PERSONAL FACTORS:  Past/current experiences and 1 comorbidity: HTN are also affecting patient's functional outcome.   REHAB POTENTIAL: Good  CLINICAL DECISION MAKING: Stable/uncomplicated  EVALUATION COMPLEXITY: Low   GOALS: Goals reviewed with patient? YES  SHORT TERM GOALS: Target date: 07/19/2024   Patient will be independent with initial home program at least 3 days/week.  Baseline: provided at eval Goal Status: MET  2.  Patient will demonstrate improved DF AROM to at least neutral (0 degrees) Baseline: lacking 10 degrees Goal status: MET   LONG TERM GOALS: Target date: 09/17/2024   Patient will report improved overall functional ability with LEFS score of 65/80 or greater.  Baseline: 47/80 10/08/225: 58/80; remaining challenge with prolonged weight bearing activities and running which patient would like to work towards  Goal Status: NOT MET   2.  Patient will demonstrate at least 4+/5 R ankle MMT scores Goal status: MET  3.  Patient will report ability to tolerate at least 20 minutes of driving in order to return to work-related traveling without exacerbation of pain or localized swelling.  Baseline: unable  Goal status: MET  4.  Patient will demonstrate ability to tolerate at least 20 minutes of weight bearing activities in order to return to ADLs and work-related activities without exacerbation of pain or localized swelling.  Baseline: unable  Goal status: MET     PLAN:  PT FREQUENCY: 1x/week  PT DURATION: 4 weeks   PLANNED INTERVENTIONS: 97164- PT Re-evaluation, 97750- Physical Performance Testing, 97110-Therapeutic exercises, 97530- Therapeutic activity, 97112- Neuromuscular re-education, 97535- Self Care, 02859- Manual therapy, [phone]- Gait training, [phone]- Aquatic Therapy, [phone]- Electrical stimulation (unattended), Patient/Family education, Balance training, Stair training, Taping, Joint mobilization, Cryotherapy, and Moist heat  PLAN FOR NEXT SESSION: address gait  mechanics and further progress WB interventions    Marko Molt, PT, DPT  08/26/2024 10:45 PM

## 2024-09-07 ENCOUNTER — Ambulatory Visit

## 2024-09-07 DIAGNOSIS — M25571 Pain in right ankle and joints of right foot: Secondary | ICD-10-CM

## 2024-09-07 DIAGNOSIS — R600 Localized edema: Secondary | ICD-10-CM

## 2024-09-07 DIAGNOSIS — R262 Difficulty in walking, not elsewhere classified: Secondary | ICD-10-CM

## 2024-09-07 NOTE — Therapy (Signed)
 OUTPATIENT PHYSICAL THERAPY TREATMENT NOTE   Patient Name: Pam Hart MRN: [id number] DOB:20-Jul-1961, 63 y.o., female Today's Date: 09/07/2024  END OF SESSION:  PT End of Session - 09/07/24 1457     Visit Number 12    Number of Visits 16    Date for Recertification  09/17/24    Authorization Type [employer] MCD    Authorization - Visit Number 16    Authorization - Number of Visits 27    PT Start Time 1456   Pt arrived late   PT Stop Time 1526    PT Time Calculation (min) 30 min    Activity Tolerance Patient tolerated treatment well         Past Medical History:  Diagnosis Date   Anemia    Arthritis    pt requested removal   Fatty liver    GERD (gastroesophageal reflux disease)    Hypertension    Thyroid  disease    pt requested removal   Past Surgical History:  Procedure Laterality Date   ACHILLES TENDON SURGERY Right 04/08/2024   Procedure: REPAIR, TENDON, ACHILLES;  Surgeon: Janit Thresa CHRISTELLA, DPM;  Location: [facility];  Service: Orthopedics/Podiatry;  Laterality: Right;   APPENDECTOMY     CARPAL TUNNEL RELEASE Bilateral    EYE SURGERY Right 11/17/2009   lasix    HEEL SPUR RESECTION Right 04/08/2024   Procedure: EXCISION, BONE SPUR, CALCANEUS;  Surgeon: Janit Thresa CHRISTELLA, DPM;  Location: [facility];  Service: Orthopedics/Podiatry;  Laterality: Right;   Patient Active Problem List   Diagnosis Date Noted   Sinus bradycardia 02/03/2020   Pre-syncope 02/02/2020   Right elbow pain 01/05/2017   Right hand pain 01/05/2017   Thyroid  nodule 07/29/2016   Neck pain 10/29/2015   Hypertension, uncontrolled 08/22/2015   Chest pain with moderate risk of acute coronary syndrome 08/22/2015   GERD (gastroesophageal reflux disease) 08/22/2015   Hyperlipidemia 03/17/2013   FATIGUE 08/05/2007    PCP: Domenick Loma, NP  REFERRING PROVIDER: Janit Thresa CHRISTELLA, DPM  REFERRING DIAG:  [phone] (ICD-10-CM) - Achilles tendon rupture, right, initial encounter  M77.31 (ICD-10-CM) - Heel spur,  right  S/p posterior heel spur resection with repair of ruptured Achilles tendon right.  DOS: 04/08/2024 -Currently WBAT in cam boot -Okay to begin to slowly transition the patient out of the cam boot into tennis shoes.   -Strengthening against light resistance -Range of motion exercises  THERAPY DIAG:  Pain in right ankle and joints of right foot  Localized edema  Difficulty in walking, not elsewhere classified  Rationale for Evaluation and Treatment: Rehabilitation  Next MD visit: October  ONSET DATE: 04/08/2024 DOS (posterior heel spur resection with Achilles Tendon repair  SUBJECTIVE:   SUBJECTIVE STATEMENT: Patient reports some pain on the dorsal aspect of her right foot.   EVAL: Patient present to PT 10 weeks s/p Achilles Tendon repair and posterior heel spur resection. She states that she has been in the CAM boot for just over a month and is working on mobility exercises.   PERTINENT HISTORY: Relevant PMHx includes HTN and post op status   PAIN:  Are you having pain? Yes: NPRS scale: 4-5/10 current Pain location: posterior ankle and R calf  Pain description: aching, numbness/tingling, tenderness Aggravating factors: walking, sitting with legs down   Relieving factors: rest   PRECAUTIONS: None  RED FLAGS: None   WEIGHT BEARING RESTRICTIONS: Yes    FALLS:  Has patient fallen in last 6 months? Yes. Number of falls 1  03/24/24 followed by surgery   LIVING ENVIRONMENT: Lives with: lives with their family Lives in: House/apartment Stairs:Yes, internal and external with railing Has following equipment at home: Crutches and knee scooter  OCCUPATION: Interpreter  PLOF: Independent  PATIENT GOALS: To be able to walk independently, driving to return to work, return to normal (including cleaning, shopping, cooking)   NEXT MD VISIT: 09/12/2024 with referring provider  OBJECTIVE:  Note: Objective measures were completed at Evaluation unless otherwise  noted.  DIAGNOSTIC FINDINGS:   Per referring provider:  Radiographic Exam RT foot 04/13/2024:  Interval resection of the posterior heel spur noted on lateral view.  PATIENT SURVEYS:  LEFS   Extreme difficulty/unable (0), Quite a bit of difficulty (1), Moderate difficulty (2), Little difficulty (3), No difficulty (4) Survey date:  06/21/2024 08/24/24  Any of your usual work, housework or school activities 1 3  2. Usual hobbies, recreational or sporting activities 4 3  3. Getting into/out of the bath 3 4  4. Walking between rooms 4 4  5. Putting on socks/shoes 4 4  6. Squatting  1 2  7. Lifting an object, like a bag of groceries from the floor 4 3  8. Performing light activities around your home 3 3  9. Performing heavy activities around your home 1 3  10. Getting into/out of a car 4 4  11. Walking 2 blocks 4 3  12. Walking 1 mile 3 2  13. Going up/down 10 stairs (1 flight) 2 3  14. Standing for 1 hour 2 3  15.  sitting for 1 hour 4 4  16. Running on even ground 0 2  17. Running on uneven ground 0 1  18. Making sharp turns while running fast 0 1  19. Hopping  0 2  20. Rolling over in bed 3 4  Score total:  47/80 58/80     COGNITION: Overall cognitive status: Within functional limits for tasks assessed      EDEMA:  Circumferential: R 9.25 L 8.25  Figure 8: R 9 L 8   LOWER EXTREMITY ROM:  Active ROM Right eval Left eval Right 07/19/2024 Right 08/24/2024  Hip flexion      Hip extension      Hip abduction      Hip adduction      Hip internal rotation      Hip external rotation      Knee flexion      Knee extension      Ankle dorsiflexion -10 5 6 10   Ankle plantarflexion 40  40 40  Ankle inversion 45  45 40  Ankle eversion 8  12 15    (Blank rows = not tested)  LOWER EXTREMITY MMT:  MMT Right  Left    Hip flexion    Hip extension    Hip abduction    Hip adduction    Hip internal rotation    Hip external rotation    Knee flexion    Knee extension     Ankle dorsiflexion    Ankle plantarflexion    Ankle inversion    Ankle eversion     (Blank rows = not tested)   GAIT: Distance walked: 50 feet from lobby to elevation room  Assistive device utilized: None Level of assistance: Modified independence with boot Comments: mechanics impacted d/t CAM boot donned. Resulting antalgic gait pattern  TREATMENT DATE:  [facility] Adult PT Treatment:                                                DATE: 09/07/24 Therapeutic Activity: TM x 5 minutes, less than 1.0 mph for focus on heel strike Hurdle step overs fwd x 3 laps with cueing for heel strike  Standing heel raises x 10 Standing toe raises x10 Standing Marches 3x30 Discussion of goals, review and update of HEP   [facility] Adult PT Treatment:                                                DATE: 08/26/24  TM x 6 minutes, less than 1.0 mph for focus on heel strike Ankle pumps on dynadisc Pronation/supination on dynadisc Hurdle step overs fwd x 3 laps with cueing for heel strike  Standing heel raises 2 x 10 Standing toe raises 2 x 10 Standing Marches Updated and reviewed HEP    [facility] Adult PT Treatment:                                                DATE: 08/24/2024  Therapeutic Activity:  Reassessment of objective measures and subjective assessment regarding progress towards established goals and updated plan for addressing remaining deficits and rehab goals.      PATIENT EDUCATION:  Education details: reviewed initial home exercise program; discussion of POC, prognosis and goals for skilled PT   Person educated: Patient Education method: Explanation, Demonstration, and Handouts Education comprehension: verbalized understanding, returned demonstration, and needs further education  HOME EXERCISE PROGRAM: Access Code: [email or web address] URL:  [email or web address] Date: 07/06/2024 Prepared by: Corean Pouch  Exercises - Forward Step Up with Counter Support  - 3 x daily - 7 x weekly - Seated Ankle Circles  - 1 x daily - 7 x weekly - 2 sets - 10 reps - Seated Ankle Pumps on Table  - 1 x daily - 7 x weekly - 2 sets - 10 reps - Ankle Inversion Eversion Towel Slide  - 1 x daily - 7 x weekly - 2 sets - 10 reps - Seated Toe Curl  - 1 x daily - 7 x weekly - 2 sets - 10 reps - Toe Spreading  - 1 x daily - 7 x weekly - 2 sets - 10 reps - Side to Side Weight Shift with Counter Support  - 1 x daily - 7 x weekly - 3 sets - 10 reps   ASSESSMENT:  CLINICAL IMPRESSION:  Patient presents to PT reporting some pain on the dorsal aspect of her foot. Discussed goals and progress with PT with patient today, she has met or progressed towards all of her goals at this time. Updated and reviewed HEP along with education provided on supportive footwear and continuing to focus on heel strike when walking and follow up with MD PRN. Patient is appropriate for DC from PT.   OBJECTIVE IMPAIRMENTS: Abnormal gait, decreased activity tolerance, decreased balance, decreased endurance, decreased mobility, difficulty walking, decreased ROM, decreased strength, increased edema, and pain.   ACTIVITY LIMITATIONS: carrying,  lifting, bending, standing, squatting, stairs, dressing, and locomotion level  PARTICIPATION LIMITATIONS: meal prep, cleaning, laundry, interpersonal relationship, driving, shopping, community activity, and occupation  PERSONAL FACTORS: Past/current experiences and 1 comorbidity: HTN are also affecting patient's functional outcome.   REHAB POTENTIAL: Good  CLINICAL DECISION MAKING: Stable/uncomplicated  EVALUATION COMPLEXITY: Low   GOALS: Goals reviewed with patient? YES  SHORT TERM GOALS: Target date: 07/19/2024   Patient will be independent with initial home program at least 3 days/week.  Baseline: provided at  eval Goal Status: MET  2.  Patient will demonstrate improved DF AROM to at least neutral (0 degrees) Baseline: lacking 10 degrees Goal status: MET   LONG TERM GOALS: Target date: 09/17/2024   Patient will report improved overall functional ability with LEFS score of 65/80 or greater.  Baseline: 47/80 10/08/225: 58/80; remaining challenge with prolonged weight bearing activities and running which patient would like to work towards  09/07/24: 53/80 Goal Status: NOT MET   2.  Patient will demonstrate at least 4+/5 R ankle MMT scores Goal status: MET  3.  Patient will report ability to tolerate at least 20 minutes of driving in order to return to work-related traveling without exacerbation of pain or localized swelling.  Baseline: unable  Goal status: MET  4.  Patient will demonstrate ability to tolerate at least 20 minutes of weight bearing activities in order to return to ADLs and work-related activities without exacerbation of pain or localized swelling.  Baseline: unable  Goal status: MET   PLAN:  PT FREQUENCY: 1x/week  PT DURATION: 4 weeks   PLANNED INTERVENTIONS: 97164- PT Re-evaluation, 97750- Physical Performance Testing, 97110-Therapeutic exercises, 97530- Therapeutic activity, 97112- Neuromuscular re-education, 97535- Self Care, 02859- Manual therapy, [phone]- Gait training, [phone]- Aquatic Therapy, [phone]- Electrical stimulation (unattended), Patient/Family education, Balance training, Stair training, Taping, Joint mobilization, Cryotherapy, and Moist heat  PLAN FOR NEXT SESSION: address gait mechanics and further progress WB interventions    Corean Pouch PTA  09/07/2024 3:33 PM

## 2024-09-12 ENCOUNTER — Ambulatory Visit: Admitting: Podiatry

## 2024-09-19 ENCOUNTER — Encounter: Payer: Self-pay | Admitting: Radiology

## 2024-09-28 ENCOUNTER — Ambulatory Visit (INDEPENDENT_AMBULATORY_CARE_PROVIDER_SITE_OTHER)

## 2024-09-28 ENCOUNTER — Encounter: Payer: Self-pay | Admitting: Podiatry

## 2024-09-28 ENCOUNTER — Ambulatory Visit (INDEPENDENT_AMBULATORY_CARE_PROVIDER_SITE_OTHER): Admitting: Podiatry

## 2024-09-28 VITALS — Ht 62.0 in | Wt 152.0 lb

## 2024-09-28 DIAGNOSIS — M79671 Pain in right foot: Secondary | ICD-10-CM

## 2024-09-28 DIAGNOSIS — M722 Plantar fascial fibromatosis: Secondary | ICD-10-CM

## 2024-09-28 DIAGNOSIS — M7752 Other enthesopathy of left foot: Secondary | ICD-10-CM

## 2024-09-28 MED ORDER — METHYLPREDNISOLONE 4 MG PO TBPK: ORAL_TABLET | ORAL | 0 refills | Status: DC

## 2024-09-28 MED ORDER — MELOXICAM 15 MG PO TABS: 15.0000 mg | ORAL_TABLET | Freq: Every day | ORAL | 1 refills | Status: AC | PRN

## 2024-09-28 NOTE — Progress Notes (Signed)
 Chief Complaint  Patient presents with   Foot Pain    Pt is here due to bilateral foot pain and swelling, states the pain has been there for a while, this last week being the worse,also complains of itching to the bottom of the left foot deep inside.    Subjective:  Patient presents today status post posterior heel spur resection with repair of Achilles tendon right lower extremity.  DOS: 04/08/2024.  She stopped going to physical therapy about 3 weeks ago.  She states that she was doing well until about 1 week ago when she noticed increased pain to the posterior aspect of the right heel.  Over the past several months she has also developed a new complaint of deep itching sensation to the arch of the left foot.  She states that it is not on the skin.  The sensation is deep in the deeper tissues of the foot.  Past Medical History:  Diagnosis Date   Anemia    Arthritis    pt requested removal   Fatty liver    GERD (gastroesophageal reflux disease)    Hypertension    Thyroid  disease    pt requested removal    Past Surgical History:  Procedure Laterality Date   ACHILLES TENDON SURGERY Right 04/08/2024   Procedure: REPAIR, TENDON, ACHILLES;  Surgeon: Janit Thresa HERO, DPM;  Location: [facility] ORS;  Service: Orthopedics/Podiatry;  Laterality: Right;   APPENDECTOMY     CARPAL TUNNEL RELEASE Bilateral    EYE SURGERY Right 11/17/2009   lasix    HEEL SPUR RESECTION Right 04/08/2024   Procedure: EXCISION, BONE SPUR, CALCANEUS;  Surgeon: Janit Thresa HERO, DPM;  Location: [facility] ORS;  Service: Orthopedics/Podiatry;  Laterality: Right;    No Known Allergies  Objective/Physical Exam Incision is nicely healed.  Muscle strength 5/5 all compartments.  Good range of motion to the ankle joint.  Good strength to the Achilles tendon.  There is tenderness with dorsiflexion and tenderness along the posterior tubercle of the calcaneus today.  Deep itching-type pruritus sensation noted to the deep plantar  compartments of the left foot according to the patient.  Most notable at the medial longitudinal arch of the foot  Radiographic Exam RT foot 09/28/2024:  Diffuse radiolucency noted throughout the osseous structures of the foot consistent with osteoporosis.  Interval resection of the posterior heel spur noted on lateral view.  Radiographic exam LT foot 09/28/2024: No acute fracture identified.  Small minute posterior heel spur noted on lateral view.  Assessment: 1. s/p posterior heel spur resection with repair of Achilles tendon right. DOS: 04/08/2024 2.  Disuse osteopenia right foot 3.  Acute onset of posterior heel pain right x 1 week.  Onset around 09/21/2024 4.  Deep itching sensation plantar arch of the left foot   Plan of Care:  -Patient was evaluated.  X-rays reviewed -Recommend conservative treatment. -Prescription for Medrol  Dosepak -Prescription for meloxicam  15 mg daily after completion of Dosepak -Hopefully the 2 medications will also help alleviate a lot of the patient's left foot pathology -Continue physical therapy exercises at home.  She discontinued physical therapy in of October 2025 -Recommend good supportive tennis shoes and sneakers -Compression ankle sleeves dispensed.  Wear daily -Return to clinic 6 weeks.  Patient may benefit from long-term custom orthotics.  Will discuss next visit   Thresa EMERSON Janit, DPM [facility]  Dr. Thresa EMERSON Janit, DPM    [address]  [place], [place] [address]                Office [phone]  Fax [phone]

## 2024-09-29 ENCOUNTER — Ambulatory Visit

## 2024-09-29 VITALS — BP 158/78 | HR 59 | Ht 60.0 in | Wt 151.7 lb

## 2024-09-29 DIAGNOSIS — I10 Essential (primary) hypertension: Secondary | ICD-10-CM | POA: Diagnosis present

## 2024-09-29 DIAGNOSIS — E782 Mixed hyperlipidemia: Secondary | ICD-10-CM | POA: Insufficient documentation

## 2024-09-29 DIAGNOSIS — R0789 Other chest pain: Secondary | ICD-10-CM | POA: Insufficient documentation

## 2024-09-29 DIAGNOSIS — R0600 Dyspnea, unspecified: Secondary | ICD-10-CM | POA: Insufficient documentation

## 2024-09-29 NOTE — Patient Instructions (Addendum)
 Medication Instructions:  Your physician recommends that you continue on your current medications as directed. Please refer to the Current Medication list given to you today.  *If you need a refill on your cardiac medications before your next appointment, please call your pharmacy*  Lab Work: Today: Lipid panel & A1C  If you have any lab test that is abnormal or we need to change your treatment, we will call you to review the results.  Testing/Procedures: Your physician has requested that you have an echocardiogram. Echocardiography is a painless test that uses sound waves to create images of your heart. It provides your doctor with information about the size and shape of your heart and how well your heart's chambers and valves are working. This procedure takes approximately one hour. There are no restrictions for this procedure. Please do NOT wear cologne, perfume, aftershave, or lotions (deodorant is allowed). Please arrive 15 minutes prior to your appointment time.  Please note: We ask at that you not bring children with you during ultrasound (echo/ vascular) testing. Due to room size and safety concerns, children are not allowed in the ultrasound rooms during exams. Our front office staff cannot provide observation of children in our lobby area while testing is being conducted. An adult accompanying a patient to their appointment will only be allowed in the ultrasound room at the discretion of the ultrasound technician under special circumstances. We apologize for any inconvenience.    Your physician has requested that you have cardiac CT. Cardiac computed tomography (CT) is a painless test that uses an x-ray machine to take clear, detailed pictures of your heart. For further information please visit [email or web address] Please follow instruction sheet as given.    Follow-Up: At [facility], you and your health needs are our priority.  As part of our continuing mission to  provide you with exceptional heart care, our providers are all part of one team.  This team includes your primary Cardiologist (physician) and Advanced Practice Providers or APPs (Physician Assistants and Nurse Practitioners) who all work together to provide you with the care you need, when you need it.  Your next appointment:   6 month(s)  Provider:   One of our Advanced Practice Providers (APPs): Morse Clause, PA-C  Lamarr Satterfield, NP Miriam Shams, NP  Olivia Pavy, PA-C Josefa Beauvais, NP  Leontine Salen, PA-C Orren Fabry, PA-C  [place], PA-C Ernest Dick, NP  Damien Braver, NP Jon Hails, PA-C  Waddell Donath, PA-C    Dayna Dunn, PA-C  Scott Weaver, PA-C Lum Louis, NP Katlyn West, NP Callie Goodrich, PA-C  Xika Zhao, NP Sheng Haley, PA-C    Kathleen Johnson, PA-C    Thank you for choosing [facility]!!   [phone]       Your cardiac CT will be scheduled at one of the below locations:   [facility] [address]  [place], [place] [address]  If scheduled at the [facility] and [facility] at [address], please enter the parking lot using the [address] entrance and use the FREE valet service at the patient drop-off area. Enter the building and check-in with registration on the main floor.  Please follow these instructions carefully (unless otherwise directed):  An IV will be required for this test and Nitroglycerin  will be given.  Hold all erectile dysfunction medications at least 3 days (72 hrs) prior to test. (Ie viagra, cialis, sildenafil, tadalafil, etc)   On the Night Before the Test:  Be sure to Drink plenty of water. Do not consume any caffeinated/decaffeinated beverages or chocolate 12 hours prior to your test. Do not take any antihistamines 12 hours prior to your test.  On the Day of the Test: Drink plenty of water until 1 hour prior to the test. Do not eat any food 1 hour prior to test. You may take  your regular medications prior to the test.  If you take Furosemide/Hydrochlorothiazide /Spironolactone/Chlorthalidone , please HOLD on the morning of the test. Patients who wear a continuous glucose monitor MUST remove the device prior to scanning. FEMALES- please wear underwire-free bra if available, avoid dresses & tight clothing        After the Test: Drink plenty of water. After receiving IV contrast, you may experience a mild flushed feeling. This is normal. On occasion, you may experience a mild rash up to 24 hours after the test. This is not dangerous. If this occurs, you can take Benadryl 25 mg, Zyrtec, Claritin, or Allegra and increase your fluid intake. (Patients taking Tikosyn should avoid Benadryl, and may take Zyrtec, Claritin, or Allegra) If you experience trouble breathing, this can be serious. If it is severe call 911 IMMEDIATELY. If it is mild, please call our office.  We will call to schedule your test 2-4 weeks out understanding that some insurance companies will need an authorization prior to the service being performed.   For more information and frequently asked questions, please visit our website : [email or web address]  For non-scheduling related questions, please contact the cardiac imaging nurse navigator should you have any questions/concerns: Cardiac Imaging Nurse Navigators Direct Office Dial: [phone]   For scheduling needs, including cancellations and rescheduling, please call Brittany, [phone].

## 2024-09-29 NOTE — Progress Notes (Signed)
 Cardiology Office Note Date:  09/29/2024  ID:  Pam, Hart 1960-12-19, MRN [id number] PCP:  Domenick Loma, NP  Cardiologist: Joelle VEAR [name], MD  Chief Complaint  Patient presents with   Chest Pain      Problems Chest pain with dyspnea HTN/HLD Pre-DM M: [place]-HTZ 40-12.5, AE10  Visits  11/25: LP, HA1C, coronary CTA, 2D Echo    History of Present Illness: Pam Hart is a 63 y.o. female who presents for new visit.   About 2 months ago, she had surgery and was immobile for some time.  When she started back with activities, she started having substernal chest discomfort that is non-pleuritic, nonpositional and associated with dyspnea.  She discussed with her PCP and was told to come see us .  She has continued to have intermittent chest tightness that is worse with feeling nervous and sometimes exertion.  It does get better with rest.  She denies any orthopnea, PND, or LE edema.  She does not check her BP at home but did not take her blood pressure medication this morning because she hadn't eaten  ROS: Please see the history of present illness. All other systems are reviewed and negative.   Past Medical History:  Diagnosis Date   Anemia    Arthritis    pt requested removal   Fatty liver    GERD (gastroesophageal reflux disease)    Hypertension    Thyroid  disease    pt requested removal    Past Surgical History:  Procedure Laterality Date   ACHILLES TENDON SURGERY Right 04/08/2024   Procedure: REPAIR, TENDON, ACHILLES;  Surgeon: Janit Thresa CHRISTELLA, DPM;  Location: [facility] ORS;  Service: Orthopedics/Podiatry;  Laterality: Right;   APPENDECTOMY     CARPAL TUNNEL RELEASE Bilateral    EYE SURGERY Right 11/17/2009   lasix    HEEL SPUR RESECTION Right 04/08/2024   Procedure: EXCISION, BONE SPUR, CALCANEUS;  Surgeon: Janit Thresa CHRISTELLA, DPM;  Location: [facility] OR[facility];  Service: Orthopedics/Podiatry;  Laterality: Right;    Current Outpatient Medications   Medication Sig Dispense Refill   amLODipine  (NORVASC ) 10 MG tablet Take 10 mg by mouth daily.     gabapentin  (NEURONTIN ) 300 MG capsule Take 1-2 capsules (300-600 mg total) by mouth at bedtime. (Patient taking differently: Take 300 mg by mouth at bedtime as needed.) 60 capsule 3   ibuprofen  (ADVIL ) 800 MG tablet Take 1 tablet (800 mg total) by mouth 3 (three) times daily. 60 tablet 1   meloxicam  (MOBIC ) 15 MG tablet Take 1 tablet (15 mg total) by mouth daily as needed. 60 tablet 1   methocarbamol  (ROBAXIN ) 500 MG tablet Take 1 tablet (500 mg total) by mouth 2 (two) times daily as needed for muscle spasms. 20 tablet 2   olmesartan-hydrochlorothiazide  (BENICAR HCT) 40-12.5 MG tablet Take 1 tablet by mouth daily.     pantoprazole  (PROTONIX ) 40 MG tablet Take 1 tablet (40 mg total) by mouth daily. Take 30 minutes prior to first meal 30 tablet 0   methylPREDNISolone  (MEDROL  DOSEPAK) 4 MG TBPK tablet 6 day dose pack - take as directed (Patient not taking: Reported on 09/29/2024) 21 tablet 0   nitroGLYCERIN  (NITRODUR - DOSED IN MG/24 HR) 0.4 mg/hr patch PLACE 1 PATCH ONTO THE SKIN DAILY. OK TO CUT PATCHES - PLACE AT AREA OF PAIN ON ACHILLES RIGHT (Patient not taking: Reported on 09/29/2024) 30 patch 0   No current facility-administered medications for this visit.    Allergies:  Patient has no known allergies.   Social History:  see above  Family History:  see above  PHYSICAL EXAM: VS:  BP (!) 158/78 (BP Location: Right Arm, Patient Position: Sitting, Cuff Size: Normal)   Pulse (!) 59   Ht 5' (1.524 m)   Wt 151 lb 11.2 oz (68.8 kg)   SpO2 98%   BMI 29.63 kg/m  , BMI Body mass index is 29.63 kg/m. GEN: Well nourished, well developed, in no acute distress HEENT: normal Neck: no JVD, carotid bruits, or masses Cardiac: RRR; no murmurs, rubs, or gallops,no edema  Respiratory:  CTAB bilaterally, normal work of breathing GI: soft, nontender, nondistended, + BS Extremities: No LE edema Skin:  warm and dry, no rash Neuro:  Strength and sensation are intact  EKG: Sinus bradycardia  Recent Labs: Reviewed  Studies: Reviewed  ASSESSMENT AND PLAN: Pam Hart is a 63 y.o. female who presents for new visit.  - She is presenting with intermittent chest discomfort that is sometimes exertional but can occur at rest when nervous. Her symptoms have improved since she saw her PCP, so may have had a deconditioning aspect to it. We will obtain coronary CT and 2D echo. - We will check lipid panel and HA1C. - Follow up in 3 months with PCP to assess CP.    Signed, Joelle VEAR [name], MD  09/29/2024 2:38 PM    [facility]

## 2024-09-30 ENCOUNTER — Ambulatory Visit: Payer: Self-pay

## 2024-09-30 DIAGNOSIS — E782 Mixed hyperlipidemia: Secondary | ICD-10-CM

## 2024-09-30 LAB — LIPID PANEL
Chol/HDL Ratio: 4 ratio (ref 0.0–4.4)
Cholesterol, Total: 241 mg/dL — ABNORMAL HIGH (ref 100–199)
HDL: 60 mg/dL (ref >39)
LDL Chol Calc (NIH): 154 mg/dL — ABNORMAL HIGH (ref 0–99)
Triglycerides: 149 mg/dL (ref 0–149)
VLDL Cholesterol Cal: 27 mg/dL (ref 5–40)

## 2024-09-30 LAB — HEMOGLOBIN A1C
Est. average glucose Bld gHb Est-mCnc: 120 mg/dL
Hgb A1c MFr Bld: 5.8 % — ABNORMAL HIGH (ref 4.8–5.6)

## 2024-09-30 MED ORDER — ROSUVASTATIN CALCIUM 10 MG PO TABS: 10.0000 mg | ORAL_TABLET | Freq: Every day | ORAL | 3 refills | Status: AC

## 2024-10-19 ENCOUNTER — Encounter (HOSPITAL_COMMUNITY): Payer: Self-pay

## 2024-10-21 ENCOUNTER — Ambulatory Visit (HOSPITAL_COMMUNITY)
Admission: RE | Admit: 2024-10-21 | Discharge: 2024-10-21 | Disposition: A | Source: Ambulatory Visit | Attending: Cardiology | Admitting: Cardiology

## 2024-10-21 ENCOUNTER — Ambulatory Visit (HOSPITAL_COMMUNITY): Admission: RE | Admit: 2024-10-21 | Discharge: 2024-10-21 | Disposition: A | Source: Ambulatory Visit

## 2024-10-21 ENCOUNTER — Other Ambulatory Visit: Payer: Self-pay | Admitting: Cardiology

## 2024-10-21 DIAGNOSIS — I2510 Atherosclerotic heart disease of native coronary artery without angina pectoris: Secondary | ICD-10-CM

## 2024-10-21 DIAGNOSIS — R931 Abnormal findings on diagnostic imaging of heart and coronary circulation: Secondary | ICD-10-CM

## 2024-10-21 DIAGNOSIS — R0789 Other chest pain: Secondary | ICD-10-CM

## 2024-10-21 MED ORDER — IOHEXOL 350 MG/ML SOLN
100.0000 mL | Freq: Once | INTRAVENOUS | Status: AC | PRN
  Administered 2024-10-21: 100 mL via INTRAVENOUS

## 2024-10-21 MED ORDER — NITROGLYCERIN 0.4 MG SL SUBL
0.8000 mg | SUBLINGUAL_TABLET | Freq: Once | SUBLINGUAL | Status: AC
  Administered 2024-10-21: 0.8 mg via SUBLINGUAL

## 2024-11-07 ENCOUNTER — Ambulatory Visit (HOSPITAL_COMMUNITY)
Admission: RE | Admit: 2024-11-07 | Discharge: 2024-11-07 | Disposition: A | Discharge status: Home / Self Care | Source: Ambulatory Visit | Attending: Cardiovascular Disease | Admitting: Cardiovascular Disease

## 2024-11-07 DIAGNOSIS — R079 Chest pain, unspecified: Secondary | ICD-10-CM | POA: Diagnosis not present

## 2024-11-07 DIAGNOSIS — R0789 Other chest pain: Secondary | ICD-10-CM | POA: Diagnosis present

## 2024-11-07 LAB — ECHOCARDIOGRAM COMPLETE
Area-P 1/2: 3.53 cm2
S' Lateral: 2.9 cm

## 2024-12-06 NOTE — Progress Notes (Unsigned)
" °   °  °  Cardiology Office Note Date:  12/08/2024  ID:  Pam Hart, Pam Hart [birth year], MRN [id number] PCP:  Physicians, Margarete  Cardiologist:  Joelle VEAR [name], MD  Chief Complaint  Patient presents with   Chest Pain      Problems Chest pain with dyspnea TTE 12/25: 60-65%, mild LVH, RVSP 40, mild-mod TR/PR CCTA 12/25: severe LVH, CAC 129 (89), mod stenosis in prox-mid LAD; FFR suggests LAD stenosis is flow limiting M: [place]-HTZ 40-12.5, AE10, RN10; LDL 154 11/25  Visits  11/25: LP, HA1C, coronary CTA, 2D Echo--> started RN10 given LDL 1/26: started RN? ASA81 and XL, LHC given LAD     ROS: Otherwise negative Discussed the use of AI scribe software for clinical note transcription with the patient, who gave verbal consent to proceed.  History of Present Illness She experiences chest pain approximately once a week, which subsides with rest. The exact duration for the pain to completely resolve is unclear. The chest pain is accompanied by mild shortness of breath. She has been prescribed cholesterol medication but recently stopped taking it due to heartburn. She also experiences muscle pain in her chest. Her blood pressure readings at home are typically above 135 mmHg, sometimes reaching 150 mmHg, and can go up to 160 mmHg if she skips her medication. She checks her blood pressure every two days but occasionally skips days.  Physical Exam VS:  BP (!) 140/66   Pulse 63   Ht 5' 1 (1.549 m)   Wt 152 lb (68.9 kg)   SpO2 99%   BMI 28.72 kg/m  , BMI Body mass index is 28.72 kg/m. GEN: Well nourished, well developed, in no acute distress HEENT: normal Neck: no JVD, carotid bruits, or masses Cardiac: RRR; no murmurs, rubs, or gallops,no edema  Respiratory:  CTAB bilaterally, normal work of breathing GI: soft, nontender, nondistended, + BS Extremities: No LE edema Skin: warm and dry, no rash Neuro:  Strength and sensation are intact  Recent Labs: Reviewed  Assessment and  Plan Assessment & Plan Coronary artery disease Intermittent chest pain with mild dyspnea and evidence of possible stenosis in prox-LAD. - Ordered coronary angiography for LAD assessment. - Started aspirin  therapy. - Advised emergency care if chest pain worsens.  Primary hypertension Home blood pressure readings often exceed 135 mmHg, reaching 160 mmHg when medication is missed. No changes made given nonadherence to medication.  - Provided blood pressure log for daily morning readings. - Instructed to take medication in the morning after checking blood pressure. - Will reassess management at next visit.  Mixed hyperlipidemia - Cont statin  Signed, Joelle VEAR [name], MD  12/08/2024 11:22 AM    [facility] "

## 2024-12-07 ENCOUNTER — Encounter (HOSPITAL_BASED_OUTPATIENT_CLINIC_OR_DEPARTMENT_OTHER): Payer: Self-pay

## 2024-12-08 ENCOUNTER — Ambulatory Visit

## 2024-12-08 VITALS — BP 140/66 | HR 63 | Ht 61.0 in | Wt 152.0 lb

## 2024-12-08 DIAGNOSIS — I10 Essential (primary) hypertension: Secondary | ICD-10-CM | POA: Insufficient documentation

## 2024-12-08 DIAGNOSIS — R0789 Other chest pain: Secondary | ICD-10-CM | POA: Diagnosis not present

## 2024-12-08 DIAGNOSIS — I2583 Coronary atherosclerosis due to lipid rich plaque: Secondary | ICD-10-CM | POA: Diagnosis not present

## 2024-12-08 DIAGNOSIS — R0600 Dyspnea, unspecified: Secondary | ICD-10-CM | POA: Diagnosis not present

## 2024-12-08 DIAGNOSIS — I2510 Atherosclerotic heart disease of native coronary artery without angina pectoris: Secondary | ICD-10-CM | POA: Diagnosis not present

## 2024-12-08 DIAGNOSIS — E782 Mixed hyperlipidemia: Secondary | ICD-10-CM | POA: Diagnosis not present

## 2024-12-08 MED ORDER — ASPIRIN 81 MG PO TBEC: 81.0000 mg | DELAYED_RELEASE_TABLET | Freq: Every day | ORAL | 3 refills | Status: AC

## 2024-12-08 NOTE — Patient Instructions (Addendum)
 Medication Instructions:  Start Aspirin  81 mg take one tablet daily *If you need a refill on your cardiac medications before your next appointment, please call your pharmacy*  Lab Work: 2 weeks- BMET, CBC If you have labs (blood work) drawn today and your tests are completely normal, you will receive your results only by: MyChart Message (if you have MyChart) OR A paper copy in the mail If you have any lab test that is abnormal or we need to change your treatment, we will call you to review the results.  Testing/Procedures:   [facility] A DEPT OF [place] [address] A DEPT OF THE [place]. [facility] [address] [place] [address] Dept: [phone] Loc: [phone]  Pam Hart  12/08/2024  You are scheduled for a Cardiac Catheterization on Friday, February 27 with Dr. Lurena Red.  1. Please arrive at the [facility] (Main Entrance A) at [facility]: [address] [place], [place] [address] at 7:00 AM (This time is 2 hour(s) before your procedure to ensure your preparation).    2. Diet: Nothing to eat after midnight.   3. Hydration: You need to be well hydrated before your procedure. On February 5, you may drink approved liquids (see below) until 2 hours before the procedure, with 16 oz of water as your last intake.   List of approved liquids water, clear juice, clear tea, black coffee, fruit juices, non-citric and without pulp, carbonated beverages, Gatorade, Kool -Aid, plain Jello-O and plain ice popsicles.   5. Medication instructions in preparation for your procedure:   Contrast Allergy: No   On the morning of your procedure, take your Aspirin  81 mg and any morning medicines NOT listed above.  You may use sips of water.  6. Plan to go home the same day, you will only stay overnight if medically necessary. 7. Bring a current list of your medications and current insurance cards. 8. You MUST have a  responsible person to drive you home. 9. Someone MUST be with you the first 24 hours after you arrive home or your discharge will be delayed. 10. Please wear clothes that are easy to get on and off and wear slip-on shoes.  Thank you for allowing us  to care for you!   -- [facility]   Follow-Up: At [facility], you and your health needs are our priority.  As part of our continuing mission to provide you with exceptional heart care, our providers are all part of one team.  This team includes your primary Cardiologist (physician) and Advanced Practice Providers or APPs (Physician Assistants and Nurse Practitioners) who all work together to provide you with the care you need, when you need it.  Your next appointment:   1 month(s)  Provider:   Joelle VEAR [name], MD    We recommend signing up for the patient portal called MyChart.  Sign up information is provided on this After Visit Summary.  MyChart is used to connect with patients for Virtual Visits (Telemedicine).  Patients are able to view lab/test results, encounter notes, upcoming appointments, etc.  Non-urgent messages can be sent to your provider as well.   To learn more about what you can do with MyChart, go to [email or web address].   Other Instructions We need to get a better idea of what your blood pressure is running at home. Here are some instructions to follow: - I would recommend  using a blood pressure cuff that goes on your arm. The wrist ones can be inaccurate. If you're purchasing one for the first time, try to select one that also reports your heart rate because this can be helpful information as well. - To check your blood pressure, choose a time at least 3 hours after taking your blood pressure medicines. If you can sample it at different times of the day, that's great - it might give you more information about how your blood pressure fluctuates. Remain seated in a chair for 5  minutes quietly beforehand, then check it.  - Please record a list of those readings and call us /send in [employer]

## 2024-12-09 ENCOUNTER — Telehealth: Payer: Self-pay

## 2024-12-09 NOTE — Telephone Encounter (Signed)
 Calling to move sugery date up. Please advise

## 2024-12-13 NOTE — Telephone Encounter (Signed)
 Called pt using interpreter services. No answer, interpreter left a message for pt to return the call.

## 2024-12-15 ENCOUNTER — Telehealth: Payer: Self-pay

## 2024-12-15 NOTE — Telephone Encounter (Signed)
 Patient walked in, requesting to change date of cath to 01/13/25 to 12/30/24. She states that she initially scheduled for further out because she was scared, but she has decided it would be better to get the cath done sooner. She requests cath on 12/30/24. I spoke with Beverley in the cath lab and was able to change her date to 12/30/24 with a 7 AM arrival for a 9 AM procedure time. I advised patient that this time may change, and if it does, the cath lab will call her and let her know what time to come in. Patient verbalized understanding. Patient was provided with updated cath instructions.

## 2024-12-15 NOTE — Telephone Encounter (Signed)
 Patient also advised to hydrate on the 13th and not the 5th as erroneously listed in instructions. Patient verbalizes understanding.

## 2024-12-15 NOTE — Telephone Encounter (Signed)
Please see separate encounter from today

## 2024-12-20 ENCOUNTER — Other Ambulatory Visit: Payer: Self-pay

## 2024-12-20 DIAGNOSIS — R0789 Other chest pain: Secondary | ICD-10-CM

## 2024-12-20 DIAGNOSIS — I2510 Atherosclerotic heart disease of native coronary artery without angina pectoris: Secondary | ICD-10-CM

## 2024-12-23 ENCOUNTER — Telehealth: Payer: Self-pay

## 2024-12-23 NOTE — Telephone Encounter (Signed)
"   Patient daughter is calling stating that patient went to her PCP and her cholesterol levels are elevated and thinks it is associated with a heart blockage. Patient is worried and is requesting a call back. Patient is scheduled with Dr Azobou for 01/17/25 and is added to his wait list  "

## 2024-12-30 ENCOUNTER — Ambulatory Visit (HOSPITAL_COMMUNITY): Admit: 2024-12-30 | Admitting: Cardiovascular Disease

## 2024-12-30 ENCOUNTER — Encounter (HOSPITAL_COMMUNITY): Payer: Self-pay

## 2024-12-30 ENCOUNTER — Ambulatory Visit: Admitting: Emergency Medicine

## 2025-01-02 ENCOUNTER — Ambulatory Visit

## 2025-01-05 ENCOUNTER — Ambulatory Visit

## 2025-01-17 ENCOUNTER — Ambulatory Visit

## 2025-02-06 ENCOUNTER — Ambulatory Visit
